# Patient Record
Sex: Female | Born: 1945
Health system: Southern US, Community
[De-identification: ages and names within clinical notes are randomized; demographics above are authoritative.]

## PROBLEM LIST (undated history)

## (undated) DIAGNOSIS — E7800 Pure hypercholesterolemia, unspecified: Secondary | ICD-10-CM

## (undated) DIAGNOSIS — N8189 Other female genital prolapse: Secondary | ICD-10-CM

## (undated) DIAGNOSIS — N6019 Diffuse cystic mastopathy of unspecified breast: Secondary | ICD-10-CM

## (undated) DIAGNOSIS — R339 Retention of urine, unspecified: Secondary | ICD-10-CM

## (undated) DIAGNOSIS — N8110 Cystocele, unspecified: Secondary | ICD-10-CM

## (undated) DIAGNOSIS — I10 Essential (primary) hypertension: Secondary | ICD-10-CM

## (undated) DIAGNOSIS — N3642 Intrinsic sphincter deficiency (ISD): Secondary | ICD-10-CM

## (undated) DIAGNOSIS — R945 Abnormal results of liver function studies: Secondary | ICD-10-CM

## (undated) DIAGNOSIS — C801 Malignant (primary) neoplasm, unspecified: Secondary | ICD-10-CM

## (undated) DIAGNOSIS — N816 Rectocele: Secondary | ICD-10-CM

## (undated) DIAGNOSIS — N393 Stress incontinence (female) (male): Secondary | ICD-10-CM

## (undated) DIAGNOSIS — K219 Gastro-esophageal reflux disease without esophagitis: Secondary | ICD-10-CM

## (undated) HISTORY — DX: Abnormal results of liver function studies: R94.5

## (undated) HISTORY — PX: EYE SURGERY: SHX253

## (undated) HISTORY — DX: Pure hypercholesterolemia, unspecified: E78.00

## (undated) HISTORY — DX: Retention of urine, unspecified: R33.9

## (undated) HISTORY — PX: APPENDECTOMY: SHX54

## (undated) HISTORY — PX: ABDOMINAL HYSTERECTOMY: SHX81

## (undated) HISTORY — DX: Diffuse cystic mastopathy of unspecified breast: N60.19

## (undated) HISTORY — DX: Gastro-esophageal reflux disease without esophagitis: K21.9

## (undated) HISTORY — PX: COLPORRHAPHY: SHX921

## (undated) HISTORY — DX: Essential (primary) hypertension: I10

## (undated) HISTORY — PX: TRANSVAGINAL TAPE (TVT) REMOVAL: SHX6154

## (undated) HISTORY — PX: OTHER SURGICAL HISTORY: SHX169

## (undated) HISTORY — DX: Malignant (primary) neoplasm, unspecified: C80.1

---

## 2000-10-24 HISTORY — PX: OTHER SURGICAL HISTORY: SHX169

## 2004-06-05 ENCOUNTER — Ambulatory Visit: Payer: Self-pay | Admitting: Internal Medicine

## 2005-03-11 ENCOUNTER — Ambulatory Visit: Payer: Self-pay | Admitting: Obstetrics and Gynecology

## 2006-03-14 ENCOUNTER — Ambulatory Visit: Payer: Self-pay | Admitting: Obstetrics and Gynecology

## 2007-03-17 ENCOUNTER — Ambulatory Visit: Payer: Self-pay | Admitting: Obstetrics and Gynecology

## 2007-03-21 ENCOUNTER — Ambulatory Visit: Payer: Self-pay | Admitting: Obstetrics and Gynecology

## 2008-03-22 ENCOUNTER — Ambulatory Visit: Payer: Self-pay | Admitting: Obstetrics and Gynecology

## 2008-04-01 ENCOUNTER — Ambulatory Visit: Payer: Self-pay | Admitting: Obstetrics and Gynecology

## 2009-04-02 ENCOUNTER — Ambulatory Visit: Payer: Self-pay | Admitting: Obstetrics and Gynecology

## 2010-04-06 ENCOUNTER — Ambulatory Visit: Payer: Self-pay | Admitting: Obstetrics and Gynecology

## 2011-05-19 ENCOUNTER — Ambulatory Visit: Payer: Self-pay | Admitting: Obstetrics and Gynecology

## 2011-10-21 LAB — LIPID PANEL
Cholesterol: 170 mg/dL (ref 0–200)
HDL: 39 mg/dL (ref 35–70)

## 2011-10-21 LAB — CBC AND DIFFERENTIAL
Hemoglobin: 13.2 g/dL (ref 12.0–16.0)
Platelets: 232 10*3/uL (ref 150–399)
WBC: 6.8 10^3/mL

## 2011-10-21 LAB — BASIC METABOLIC PANEL
Glucose: 105 mg/dL
Potassium: 3.5 mmol/L (ref 3.4–5.3)

## 2011-10-21 LAB — HEPATIC FUNCTION PANEL
ALT: 40 U/L — AB (ref 7–35)
AST: 31 U/L (ref 13–35)
Alkaline Phosphatase: 62 U/L (ref 25–125)

## 2012-05-19 ENCOUNTER — Ambulatory Visit: Payer: Self-pay | Admitting: Obstetrics and Gynecology

## 2012-05-19 LAB — HM MAMMOGRAPHY

## 2012-07-25 ENCOUNTER — Encounter: Payer: Self-pay | Admitting: *Deleted

## 2012-07-27 ENCOUNTER — Encounter: Payer: Self-pay | Admitting: Internal Medicine

## 2012-07-27 ENCOUNTER — Ambulatory Visit (INDEPENDENT_AMBULATORY_CARE_PROVIDER_SITE_OTHER): Payer: Medicare Other | Admitting: Internal Medicine

## 2012-07-27 VITALS — BP 120/70 | HR 58 | Temp 97.9°F | Ht 63.0 in | Wt 182.5 lb

## 2012-07-27 DIAGNOSIS — E7800 Pure hypercholesterolemia, unspecified: Secondary | ICD-10-CM

## 2012-07-27 DIAGNOSIS — K769 Liver disease, unspecified: Secondary | ICD-10-CM

## 2012-07-27 DIAGNOSIS — R945 Abnormal results of liver function studies: Secondary | ICD-10-CM

## 2012-07-27 DIAGNOSIS — I10 Essential (primary) hypertension: Secondary | ICD-10-CM

## 2012-07-27 DIAGNOSIS — R7989 Other specified abnormal findings of blood chemistry: Secondary | ICD-10-CM

## 2012-07-27 LAB — LIPID PANEL
Cholesterol: 175 mg/dL (ref 0–200)
HDL: 38 mg/dL — ABNORMAL LOW (ref >39.00)
Triglycerides: 210 mg/dL — ABNORMAL HIGH (ref 0.0–149.0)

## 2012-07-27 LAB — CBC WITH DIFFERENTIAL/PLATELET
Basophils Relative: 0.8 % (ref 0.0–3.0)
Eosinophils Relative: 1.9 % (ref 0.0–5.0)
Lymphocytes Relative: 30 % (ref 12.0–46.0)
Lymphs Abs: 2.1 10*3/uL (ref 0.7–4.0)
MCHC: 34.2 g/dL (ref 30.0–36.0)
MCV: 90.4 fl (ref 78.0–100.0)
Monocytes Relative: 5.6 % (ref 3.0–12.0)
Neutro Abs: 4.3 10*3/uL (ref 1.4–7.7)
Neutrophils Relative %: 61.7 % (ref 43.0–77.0)
Platelets: 252 10*3/uL (ref 150.0–400.0)
WBC: 7 10*3/uL (ref 4.5–10.5)

## 2012-07-27 LAB — BASIC METABOLIC PANEL
BUN: 21 mg/dL (ref 6–23)
CO2: 29 mEq/L (ref 19–32)
Calcium: 9.7 mg/dL (ref 8.4–10.5)
Chloride: 101 mEq/L (ref 96–112)
Creatinine, Ser: 1.1 mg/dL (ref 0.4–1.2)
Glucose, Bld: 114 mg/dL — ABNORMAL HIGH (ref 70–99)

## 2012-07-27 LAB — HEPATIC FUNCTION PANEL
ALT: 56 U/L — ABNORMAL HIGH (ref 0–35)
Albumin: 4 g/dL (ref 3.5–5.2)
Bilirubin, Direct: 0.2 mg/dL (ref 0.0–0.3)
Total Protein: 7.5 g/dL (ref 6.0–8.3)

## 2012-07-27 LAB — TSH: TSH: 2.46 u[IU]/mL (ref 0.35–5.50)

## 2012-07-27 MED ORDER — BISOPROLOL-HYDROCHLOROTHIAZIDE 10-6.25 MG PO TABS: 2.0000 | ORAL_TABLET | Freq: Every day | ORAL | Status: DC

## 2012-07-27 MED ORDER — OMEPRAZOLE 20 MG PO CPDR: 20.0000 mg | DELAYED_RELEASE_CAPSULE | Freq: Two times a day (BID) | ORAL | Status: DC

## 2012-07-27 MED ORDER — HYDROCHLOROTHIAZIDE 25 MG PO TABS: 25.0000 mg | ORAL_TABLET | Freq: Every day | ORAL | Status: DC

## 2012-07-27 MED ORDER — SIMVASTATIN 20 MG PO TABS: 20.0000 mg | ORAL_TABLET | Freq: Every day | ORAL | Status: DC

## 2012-07-27 MED ORDER — LOSARTAN POTASSIUM 100 MG PO TABS: 100.0000 mg | ORAL_TABLET | Freq: Every day | ORAL | Status: DC

## 2012-08-07 ENCOUNTER — Encounter: Payer: Self-pay | Admitting: Internal Medicine

## 2012-08-07 DIAGNOSIS — E7800 Pure hypercholesterolemia, unspecified: Secondary | ICD-10-CM | POA: Insufficient documentation

## 2012-08-07 DIAGNOSIS — I10 Essential (primary) hypertension: Secondary | ICD-10-CM | POA: Insufficient documentation

## 2012-08-07 DIAGNOSIS — R945 Abnormal results of liver function studies: Secondary | ICD-10-CM | POA: Insufficient documentation

## 2012-08-07 NOTE — Assessment & Plan Note (Signed)
On simvastatin.  Low cholesterol diet and exercise.  Check lipid panel and liver function.  

## 2012-08-07 NOTE — Progress Notes (Signed)
Subjective:    Patient ID: Lindsey Harmon, female    DOB: 11-11-1945, 67 y.o.   MRN: [id number]  HPI 67 year old female with past history of hypercholesterolemia, hypertension and abnormal liver function who comes in today for a scheduled follow up.  Saw Dr Logan Bores 11/13.  Had her mammogram 1/14.  States everything checked out fine.  Eating and drinking well.  No nausea or vomiting.  No bowel change.  No chest pain or tightness.  No sob. No acid reflux.  Bowels stable.    Past Medical History  Diagnosis Date  . Hypertension   . Pure hypercholesterolemia   . GERD (gastroesophageal reflux disease)   . Urinary retention   . Fibrocystic breast disease   . Abnormal liver function     Outpatient Encounter Prescriptions as of 07/27/2012  Medication Sig Dispense Refill  . bisoprolol-hydrochlorothiazide (ZIAC) 10-6.25 MG per tablet Take 2 tablets by mouth daily.  180 tablet  1  . hydrochlorothiazide (HYDRODIURIL) 25 MG tablet Take 1 tablet (25 mg total) by mouth daily.  90 tablet  1  . losartan (COZAAR) 100 MG tablet Take 1 tablet (100 mg total) by mouth daily.  90 tablet  1  . nitrofurantoin, macrocrystal-monohydrate, (MACROBID) 100 MG capsule Take 100 mg by mouth daily.      Marland Kitchen omeprazole (PRILOSEC) 20 MG capsule Take 1 capsule (20 mg total) by mouth 2 (two) times daily.  180 capsule  1  . simvastatin (ZOCOR) 20 MG tablet Take 1 tablet (20 mg total) by mouth daily.  90 tablet  1  . [DISCONTINUED] bisoprolol-hydrochlorothiazide (ZIAC) 10-6.25 MG per tablet Take 2 tablets by mouth daily.      . [DISCONTINUED] hydrochlorothiazide (HYDRODIURIL) 25 MG tablet Take 25 mg by mouth daily.      . [DISCONTINUED] losartan (COZAAR) 100 MG tablet Take 100 mg by mouth daily.      . [DISCONTINUED] omeprazole (PRILOSEC) 20 MG capsule Take 20 mg by mouth 2 (two) times daily.      . [DISCONTINUED] simvastatin (ZOCOR) 20 MG tablet Take 20 mg by mouth daily.       No facility-administered encounter medications on file  as of 07/27/2012.    Review of Systems Patient denies any headache, lightheadedness or dizziness.  No sinus or allergy symptoms.  No chest pain, tightness or palpitations.  No increased shortness of breath, cough or congestion.  No nausea or vomiting. No acid reflux.  No abdominal pain or cramping.  No bowel change, such as diarrhea, constipation, BRBPR or melana.  No urine change.        Objective:   Physical Exam Filed Vitals:   07/27/12 0822  BP: 120/70  Pulse: 58  Temp: 97.9 F (36.6 C)   Blood pressure recheck:  126/78, pulse 20  67 year old female in no acute distress.   HEENT:  Nares- clear.  Oropharynx - without lesions. NECK:  Supple.  Nontender.  No audible bruit.  HEART:  Appears to be regular. LUNGS:  No crackles or wheezing audible.  Respirations even and unlabored.  RADIAL PULSE:  Equal bilaterally.  ABDOMEN:  Soft, nontender.  Bowel sounds present and normal.  No audible abdominal bruit.    EXTREMITIES:  No increased edema present.  DP pulses palpable and equal bilaterally.          Assessment & Plan:  GYN.  Sees Dr Logan Bores.  Up to date.  Just had her pelvic 11/13.    HEALTH MAINTENANCE.  Pelvic/pap  through gyn.  See above.  States everything checked out fine.  Obtain records.  Mammogram 1/14.  Obtain results.  Declines colonoscopy.  I discussed the need for colonoscopy.  Stressed the importance of early screening.  She will notify me when she is agreeable.

## 2012-08-07 NOTE — Assessment & Plan Note (Signed)
Recheck liver panel today. Was worked up by GI.  Felt to be due to fatty liver.

## 2012-08-07 NOTE — Assessment & Plan Note (Signed)
Blood pressure doing well.  Same medication regimen.  Check metabolic panel.  

## 2012-08-12 ENCOUNTER — Other Ambulatory Visit: Payer: Self-pay | Admitting: Internal Medicine

## 2012-08-12 ENCOUNTER — Telehealth: Payer: Self-pay | Admitting: Internal Medicine

## 2012-08-12 DIAGNOSIS — R739 Hyperglycemia, unspecified: Secondary | ICD-10-CM

## 2012-08-12 DIAGNOSIS — R945 Abnormal results of liver function studies: Secondary | ICD-10-CM

## 2012-08-12 DIAGNOSIS — E876 Hypokalemia: Secondary | ICD-10-CM

## 2012-08-12 NOTE — Progress Notes (Signed)
Order placed for follow up lab.  

## 2012-08-12 NOTE — Telephone Encounter (Signed)
Pt notified of labs and need for follow up labs.  She will come in 08/21/12 (10:00).  Pt aware of time.  Just need to put on lab schedule.

## 2012-08-14 NOTE — Telephone Encounter (Signed)
Appointment made

## 2012-08-21 ENCOUNTER — Other Ambulatory Visit (INDEPENDENT_AMBULATORY_CARE_PROVIDER_SITE_OTHER): Payer: Medicare Other

## 2012-08-21 DIAGNOSIS — R7309 Other abnormal glucose: Secondary | ICD-10-CM

## 2012-08-21 DIAGNOSIS — E876 Hypokalemia: Secondary | ICD-10-CM

## 2012-08-21 DIAGNOSIS — R945 Abnormal results of liver function studies: Secondary | ICD-10-CM

## 2012-08-21 DIAGNOSIS — R739 Hyperglycemia, unspecified: Secondary | ICD-10-CM

## 2012-08-21 DIAGNOSIS — R7989 Other specified abnormal findings of blood chemistry: Secondary | ICD-10-CM

## 2012-08-21 LAB — BASIC METABOLIC PANEL
BUN: 28 mg/dL — ABNORMAL HIGH (ref 6–23)
CO2: 30 mEq/L (ref 19–32)
Calcium: 10.1 mg/dL (ref 8.4–10.5)
Chloride: 99 mEq/L (ref 96–112)
Creatinine, Ser: 1.3 mg/dL — ABNORMAL HIGH (ref 0.4–1.2)
GFR: 42.38 mL/min — ABNORMAL LOW (ref >60.00)
Glucose, Bld: 98 mg/dL (ref 70–99)
Potassium: 3.5 mEq/L (ref 3.5–5.1)
Sodium: 140 mEq/L (ref 135–145)

## 2012-08-21 LAB — HEPATIC FUNCTION PANEL
ALT: 39 U/L — ABNORMAL HIGH (ref 0–35)
AST: 37 U/L (ref 0–37)
Albumin: 4 g/dL (ref 3.5–5.2)
Alkaline Phosphatase: 57 U/L (ref 39–117)
Bilirubin, Direct: 0.1 mg/dL (ref 0.0–0.3)
Total Bilirubin: 1.1 mg/dL (ref 0.3–1.2)
Total Protein: 7.5 g/dL (ref 6.0–8.3)

## 2012-08-21 LAB — HEMOGLOBIN A1C: Hgb A1c MFr Bld: 5.9 % (ref 4.6–6.5)

## 2012-08-22 ENCOUNTER — Telehealth: Payer: Self-pay | Admitting: Internal Medicine

## 2012-08-22 DIAGNOSIS — N289 Disorder of kidney and ureter, unspecified: Secondary | ICD-10-CM

## 2012-08-22 NOTE — Telephone Encounter (Signed)
Appointment made

## 2012-08-22 NOTE — Telephone Encounter (Signed)
Pt notified of lab results and the need for follow up labs soon.  She is coming in 09/04/12 at 10:00 for labs.  Please put on schedule.  Thanks.  She is aware of appt.

## 2012-09-04 ENCOUNTER — Other Ambulatory Visit (INDEPENDENT_AMBULATORY_CARE_PROVIDER_SITE_OTHER): Payer: Medicare Other

## 2012-09-04 ENCOUNTER — Encounter: Payer: Self-pay | Admitting: *Deleted

## 2012-09-04 DIAGNOSIS — I10 Essential (primary) hypertension: Secondary | ICD-10-CM

## 2012-09-04 DIAGNOSIS — N289 Disorder of kidney and ureter, unspecified: Secondary | ICD-10-CM

## 2012-09-04 LAB — URINALYSIS, ROUTINE W REFLEX MICROSCOPIC
Bilirubin Urine: NEGATIVE
Hgb urine dipstick: NEGATIVE
Nitrite: NEGATIVE
Total Protein, Urine: NEGATIVE
Urine Glucose: NEGATIVE
pH: 6 (ref 5.0–8.0)

## 2012-09-04 LAB — BASIC METABOLIC PANEL
BUN: 25 mg/dL — ABNORMAL HIGH (ref 6–23)
CO2: 30 mEq/L (ref 19–32)
Chloride: 99 mEq/L (ref 96–112)
Creatinine, Ser: 1.1 mg/dL (ref 0.4–1.2)
Potassium: 3.7 mEq/L (ref 3.5–5.1)

## 2012-12-20 ENCOUNTER — Other Ambulatory Visit: Payer: Self-pay

## 2013-01-17 ENCOUNTER — Other Ambulatory Visit: Payer: Self-pay | Admitting: *Deleted

## 2013-01-17 MED ORDER — SIMVASTATIN 20 MG PO TABS: 20.0000 mg | ORAL_TABLET | Freq: Every day | ORAL | Status: DC

## 2013-01-17 MED ORDER — HYDROCHLOROTHIAZIDE 25 MG PO TABS: 25.0000 mg | ORAL_TABLET | Freq: Every day | ORAL | Status: DC

## 2013-01-17 MED ORDER — BISOPROLOL-HYDROCHLOROTHIAZIDE 10-6.25 MG PO TABS: 2.0000 | ORAL_TABLET | Freq: Every day | ORAL | Status: DC

## 2013-01-30 ENCOUNTER — Encounter: Payer: Self-pay | Admitting: Internal Medicine

## 2013-01-30 ENCOUNTER — Ambulatory Visit (INDEPENDENT_AMBULATORY_CARE_PROVIDER_SITE_OTHER): Payer: Medicare Other | Admitting: Internal Medicine

## 2013-01-30 VITALS — BP 120/80 | HR 55 | Temp 98.6°F | Ht 63.25 in | Wt 178.2 lb

## 2013-01-30 DIAGNOSIS — R109 Unspecified abdominal pain: Secondary | ICD-10-CM

## 2013-01-30 DIAGNOSIS — F429 Obsessive-compulsive disorder, unspecified: Secondary | ICD-10-CM

## 2013-01-30 DIAGNOSIS — Z87440 Personal history of urinary (tract) infections: Secondary | ICD-10-CM

## 2013-01-30 DIAGNOSIS — N951 Menopausal and female climacteric states: Secondary | ICD-10-CM

## 2013-01-30 DIAGNOSIS — R945 Abnormal results of liver function studies: Secondary | ICD-10-CM

## 2013-01-30 DIAGNOSIS — R232 Flushing: Secondary | ICD-10-CM

## 2013-01-30 DIAGNOSIS — I10 Essential (primary) hypertension: Secondary | ICD-10-CM

## 2013-01-30 DIAGNOSIS — E7800 Pure hypercholesterolemia, unspecified: Secondary | ICD-10-CM

## 2013-01-30 DIAGNOSIS — R1030 Lower abdominal pain, unspecified: Secondary | ICD-10-CM

## 2013-01-30 DIAGNOSIS — K769 Liver disease, unspecified: Secondary | ICD-10-CM

## 2013-01-30 LAB — CBC WITH DIFFERENTIAL/PLATELET
Eosinophils Relative: 0.8 % (ref 0.0–5.0)
HCT: 38 % (ref 36.0–46.0)
Hemoglobin: 13 g/dL (ref 12.0–15.0)
Lymphs Abs: 2.2 10*3/uL (ref 0.7–4.0)
MCV: 91.4 fl (ref 78.0–100.0)
Monocytes Absolute: 0.5 10*3/uL (ref 0.1–1.0)
Monocytes Relative: 6.6 % (ref 3.0–12.0)
Neutro Abs: 5.5 10*3/uL (ref 1.4–7.7)
WBC: 8.3 10*3/uL (ref 4.5–10.5)

## 2013-01-30 LAB — BASIC METABOLIC PANEL
Chloride: 98 mEq/L (ref 96–112)
GFR: 41.24 mL/min — ABNORMAL LOW (ref >60.00)
Glucose, Bld: 96 mg/dL (ref 70–99)
Potassium: 3.2 mEq/L — ABNORMAL LOW (ref 3.5–5.1)
Sodium: 139 mEq/L (ref 135–145)

## 2013-01-30 LAB — URINALYSIS, ROUTINE W REFLEX MICROSCOPIC
Ketones, ur: NEGATIVE
RBC / HPF: NONE SEEN (ref 0–?)
Specific Gravity, Urine: <=1.005 (ref 1.000–1.030)
Total Protein, Urine: NEGATIVE
Urine Glucose: NEGATIVE
pH: 6.5 (ref 5.0–8.0)

## 2013-01-30 LAB — LIPID PANEL
LDL Cholesterol: 87 mg/dL (ref 0–99)
Total CHOL/HDL Ratio: 4

## 2013-01-30 LAB — HEPATIC FUNCTION PANEL
Alkaline Phosphatase: 56 U/L (ref 39–117)
Bilirubin, Direct: 0.1 mg/dL (ref 0.0–0.3)

## 2013-01-30 MED ORDER — NYSTATIN 100000 UNIT/GM EX CREA: TOPICAL_CREAM | Freq: Two times a day (BID) | CUTANEOUS | Status: DC

## 2013-01-30 MED ORDER — SERTRALINE HCL 50 MG PO TABS: 50.0000 mg | ORAL_TABLET | Freq: Every day | ORAL | Status: DC

## 2013-01-31 ENCOUNTER — Encounter: Payer: Self-pay | Admitting: Internal Medicine

## 2013-01-31 ENCOUNTER — Telehealth: Payer: Self-pay | Admitting: Internal Medicine

## 2013-01-31 DIAGNOSIS — N289 Disorder of kidney and ureter, unspecified: Secondary | ICD-10-CM

## 2013-01-31 DIAGNOSIS — E871 Hypo-osmolality and hyponatremia: Secondary | ICD-10-CM

## 2013-01-31 NOTE — Telephone Encounter (Signed)
Pt will be out of town next week she made appointment for 9/29.  Pt wanted to make sure it was ok to wait till then

## 2013-01-31 NOTE — Telephone Encounter (Signed)
Ok to wait until she returns.

## 2013-01-31 NOTE — Telephone Encounter (Signed)
Pt notified of lab results via my chart.  Needs a f/u lab in 7-10 days.  Please schedule her for a non fasting lab appt next week and call her with a lab appt date and time.   Thanks.

## 2013-02-01 ENCOUNTER — Encounter: Payer: Self-pay | Admitting: Internal Medicine

## 2013-02-01 LAB — CULTURE, URINE COMPREHENSIVE
Colony Count: NO GROWTH
Organism ID, Bacteria: NO GROWTH

## 2013-02-02 ENCOUNTER — Other Ambulatory Visit: Payer: Self-pay | Admitting: Internal Medicine

## 2013-02-02 ENCOUNTER — Encounter: Payer: Self-pay | Admitting: Internal Medicine

## 2013-02-02 DIAGNOSIS — F429 Obsessive-compulsive disorder, unspecified: Secondary | ICD-10-CM | POA: Insufficient documentation

## 2013-02-02 DIAGNOSIS — Z87440 Personal history of urinary (tract) infections: Secondary | ICD-10-CM | POA: Insufficient documentation

## 2013-02-02 MED ORDER — SIMVASTATIN 20 MG PO TABS: 20.0000 mg | ORAL_TABLET | Freq: Every day | ORAL | Status: DC

## 2013-02-02 MED ORDER — BISOPROLOL-HYDROCHLOROTHIAZIDE 10-6.25 MG PO TABS: 2.0000 | ORAL_TABLET | Freq: Every day | ORAL | Status: DC

## 2013-02-02 MED ORDER — OMEPRAZOLE 20 MG PO CPDR: 20.0000 mg | DELAYED_RELEASE_CAPSULE | Freq: Two times a day (BID) | ORAL | Status: DC

## 2013-02-02 MED ORDER — LOSARTAN POTASSIUM 100 MG PO TABS: 100.0000 mg | ORAL_TABLET | Freq: Every day | ORAL | Status: DC

## 2013-02-02 MED ORDER — HYDROCHLOROTHIAZIDE 25 MG PO TABS: 25.0000 mg | ORAL_TABLET | Freq: Every day | ORAL | Status: DC

## 2013-02-02 NOTE — Assessment & Plan Note (Signed)
Dr Logan Bores gave her macrobid as a suppressive abx.  Just treated herself for possible uti.  Recheck urinalysis today.

## 2013-02-02 NOTE — Assessment & Plan Note (Signed)
Discussed at length with her today.  Some increased anxiety.  Start zoloft 50mg  (1/2 tablet) q day for 7 days and then increase to 50mg  q day.  Discussed counseling/psychiatry referral.  She declines.  Will follow. Closely.  Notify me if symptoms change or worsen.

## 2013-02-02 NOTE — Progress Notes (Signed)
Refilled omeprazole #180 with one refill

## 2013-02-02 NOTE — Assessment & Plan Note (Signed)
On simvastatin.  Low cholesterol diet and exercise.  Follow lipid panel and liver function.      

## 2013-02-02 NOTE — Assessment & Plan Note (Signed)
Recheck liver panel.  Was worked up by GI.  Felt to be due to fatty liver.

## 2013-02-02 NOTE — Assessment & Plan Note (Signed)
Blood pressure doing well.  Same medication regimen.  Follow metabolic panel.   

## 2013-02-02 NOTE — Progress Notes (Signed)
Subjective:    Patient ID: Lindsey Harmon, female    DOB: [birth year], 67 y.o.   MRN: [id number]  HPI 67 year old female with past history of hypercholesterolemia, hypertension and abnormal liver function who comes in today for a scheduled follow up.  Saw Dr Logan Bores 11/13.  Had her mammogram 1/14.  States everything checked out fine.  Eating and drinking well.  No nausea or vomiting.  No bowel change.  No chest pain or tightness.  No sob. No acid reflux.  Bowels stable.  Did have some lower abdominal pressure.  Has macrobid which she takes daily as a suppressive abx.  States Dr Logan Bores had instructed her if she has symptoms c/w uti, then take bid.  She took the macrobid twice a day for five days.  Symptoms improved/resolved.  Wants urine checked to confirm cleared.  She does report that she has some obsessive compulsive tendencies.  Getting worse.  Has to check things multiple times, etc.  States no one in her family knows.  She tries to hide this.  Feels it is gradually getting worse.  Some anxiety.  Discussed counseling and medications.  She declines counseling.     Past Medical History  Diagnosis Date  . Hypertension   . Pure hypercholesterolemia   . GERD (gastroesophageal reflux disease)   . Urinary retention   . Fibrocystic breast disease   . Abnormal liver function     Outpatient Encounter Prescriptions as of 01/30/2013  Medication Sig Dispense Refill  . bisoprolol-hydrochlorothiazide (ZIAC) 10-6.25 MG per tablet Take 2 tablets by mouth daily.  180 tablet  0  . Calcium Carbonate-Vitamin D (CALTRATE 600+D PO) Take by mouth.      . hydrochlorothiazide (HYDRODIURIL) 25 MG tablet Take 1 tablet (25 mg total) by mouth daily.  90 tablet  0  . losartan (COZAAR) 100 MG tablet Take 1 tablet (100 mg total) by mouth daily.  90 tablet  1  . nitrofurantoin, macrocrystal-monohydrate, (MACROBID) 100 MG capsule Take 100 mg by mouth daily.      . Omega-3 Fatty Acids (FISH OIL) 1200 MG CAPS Take by mouth daily.       Marland Kitchen omeprazole (PRILOSEC) 20 MG capsule Take 1 capsule (20 mg total) by mouth 2 (two) times daily.  180 capsule  1  . simvastatin (ZOCOR) 20 MG tablet Take 1 tablet (20 mg total) by mouth daily.  90 tablet  0  . nystatin cream (MYCOSTATIN) Apply topically 2 (two) times daily.  30 g  0  . sertraline (ZOLOFT) 50 MG tablet Take 1 tablet (50 mg total) by mouth daily. Take in the morning  30 tablet  1   No facility-administered encounter medications on file as of 01/30/2013.    Review of Systems Patient denies any headache, lightheadedness or dizziness.  No sinus or allergy symptoms.  No chest pain, tightness or palpitations.  No increased shortness of breath, cough or congestion.  No nausea or vomiting. No acid reflux.  No abdominal pain or cramping.  No bowel change, such as diarrhea, constipation, BRBPR or melana.  No urine change now.  Previous symptoms as outlined.        Objective:   Physical Exam  Filed Vitals:   01/30/13 1044  BP: 120/80  Pulse: 55  Temp: 98.6 F (37 C)   Blood pressure recheck:  130/72, pulse 21  67 year old female in no acute distress.   HEENT:  Nares- clear.  Oropharynx - without lesions. NECK:  Supple.  Nontender.  No audible bruit.  HEART:  Appears to be regular. LUNGS:  No crackles or wheezing audible.  Respirations even and unlabored.  RADIAL PULSE:  Equal bilaterally.  ABDOMEN:  Soft, nontender.  Bowel sounds present and normal.  No audible abdominal bruit.    EXTREMITIES:  No increased edema present.  DP pulses palpable and equal bilaterally.          Assessment & Plan:  GYN.  Sees Dr Logan Bores.  Up to date.  Just had her pelvic 11/13.    HEALTH MAINTENANCE.  Pelvic/pap through gyn.  See above.  States everything checked out fine.  Obtain records.  Mammogram 1/14.  Declines colonoscopy.  I discussed the need for colonoscopy.  Stressed the importance of early screening.  She will notify me when she is agreeable.

## 2013-02-12 ENCOUNTER — Other Ambulatory Visit (INDEPENDENT_AMBULATORY_CARE_PROVIDER_SITE_OTHER): Payer: Medicare Other

## 2013-02-12 ENCOUNTER — Encounter: Payer: Self-pay | Admitting: Internal Medicine

## 2013-02-12 DIAGNOSIS — E871 Hypo-osmolality and hyponatremia: Secondary | ICD-10-CM

## 2013-02-12 DIAGNOSIS — N289 Disorder of kidney and ureter, unspecified: Secondary | ICD-10-CM

## 2013-02-12 LAB — BASIC METABOLIC PANEL
BUN: 14 mg/dL (ref 6–23)
Creatinine, Ser: 1 mg/dL (ref 0.4–1.2)
GFR: 58.98 mL/min — ABNORMAL LOW (ref >60.00)
Potassium: 3.7 mEq/L (ref 3.5–5.1)

## 2013-02-14 ENCOUNTER — Encounter: Payer: Self-pay | Admitting: Internal Medicine

## 2013-03-14 ENCOUNTER — Encounter: Payer: Self-pay | Admitting: Internal Medicine

## 2013-03-14 ENCOUNTER — Ambulatory Visit (INDEPENDENT_AMBULATORY_CARE_PROVIDER_SITE_OTHER): Payer: Medicare Other | Admitting: Internal Medicine

## 2013-03-14 VITALS — BP 120/78 | HR 54 | Temp 98.5°F | Ht 63.25 in | Wt 178.2 lb

## 2013-03-14 DIAGNOSIS — F429 Obsessive-compulsive disorder, unspecified: Secondary | ICD-10-CM

## 2013-03-14 DIAGNOSIS — I10 Essential (primary) hypertension: Secondary | ICD-10-CM

## 2013-03-14 DIAGNOSIS — K769 Liver disease, unspecified: Secondary | ICD-10-CM

## 2013-03-14 DIAGNOSIS — R945 Abnormal results of liver function studies: Secondary | ICD-10-CM

## 2013-03-14 MED ORDER — SERTRALINE HCL 50 MG PO TABS: 50.0000 mg | ORAL_TABLET | Freq: Every day | ORAL | Status: DC

## 2013-03-15 ENCOUNTER — Encounter: Payer: Self-pay | Admitting: Internal Medicine

## 2013-03-15 NOTE — Assessment & Plan Note (Signed)
Blood pressure doing well.  Same medication regimen.  Follow metabolic panel.   

## 2013-03-15 NOTE — Progress Notes (Signed)
Subjective:    Patient ID: Lindsey Harmon, female    DOB: 14-Nov-1945, 68 y.o.   MRN: [id number]  HPI 67 year old female with past history of hypercholesterolemia, hypertension and abnormal liver function who comes in today for a scheduled follow up.  Saw Dr Logan Bores two weeks ago.   Had her mammogram 1/14.  Is scheduled to have a f/u mammogram in 1/15.  Had a vaginal cyst removed.  Everything checked out fine.   Eating and drinking well.  No nausea or vomiting.  No bowel change.  No chest pain or tightness.  No sob. No acid reflux.  Bowels stable.  She had reported at her last visit that she has some obsessive compulsive tendencies.  See last note for details.  Was started on zoloft for her increased anxiety.  She declined counseling.  She comes in today stating that she feels better.  Anxiety better.  We discussed the dose of the medication and she desires to remain on her current dose.  Discussed again counseling/psychiatry referral.  She declines.       Past Medical History  Diagnosis Date  . Hypertension   . Pure hypercholesterolemia   . GERD (gastroesophageal reflux disease)   . Urinary retention   . Fibrocystic breast disease   . Abnormal liver function     Outpatient Encounter Prescriptions as of 03/14/2013  Medication Sig Dispense Refill  . bisoprolol-hydrochlorothiazide (ZIAC) 10-6.25 MG per tablet Take 2 tablets by mouth daily.  180 tablet  1  . Calcium Carbonate-Vitamin D (CALTRATE 600+D PO) Take by mouth.      . hydrochlorothiazide (HYDRODIURIL) 25 MG tablet Take 1 tablet (25 mg total) by mouth daily.  90 tablet  1  . losartan (COZAAR) 100 MG tablet Take 1 tablet (100 mg total) by mouth daily.  90 tablet  1  . nitrofurantoin, macrocrystal-monohydrate, (MACROBID) 100 MG capsule Take 100 mg by mouth daily.      Marland Kitchen nystatin cream (MYCOSTATIN) Apply topically 2 (two) times daily.  30 g  0  . Omega-3 Fatty Acids (FISH OIL) 1200 MG CAPS Take by mouth daily.      Marland Kitchen omeprazole (PRILOSEC) 20  MG capsule Take 1 capsule (20 mg total) by mouth 2 (two) times daily.  180 capsule  1  . sertraline (ZOLOFT) 50 MG tablet Take 1 tablet (50 mg total) by mouth daily. Take in the morning  90 tablet  1  . simvastatin (ZOCOR) 20 MG tablet Take 1 tablet (20 mg total) by mouth daily.  90 tablet  1  . [DISCONTINUED] sertraline (ZOLOFT) 50 MG tablet Take 1 tablet (50 mg total) by mouth daily. Take in the morning  30 tablet  1   No facility-administered encounter medications on file as of 03/14/2013.    Review of Systems Patient denies any headache, lightheadedness or dizziness.  No sinus or allergy symptoms.  No chest pain, tightness or palpitations.  No increased shortness of breath, cough or congestion.  No nausea or vomiting. No acid reflux.  No abdominal pain or cramping.  No bowel change, such as diarrhea, constipation, BRBPR or melana.  No urine change.  Doing better on the zoloft.  Feels better.         Objective:   Physical Exam  Filed Vitals:   03/14/13 1124  BP: 120/78  Pulse: 54  Temp: 98.5 F (56.39 C)   67 year old female in no acute distress.   HEENT:  Nares- clear.  Oropharynx -  without lesions. NECK:  Supple.  Nontender.  No audible bruit.  HEART:  Appears to be regular. LUNGS:  No crackles or wheezing audible.  Respirations even and unlabored.  RADIAL PULSE:  Equal bilaterally.  ABDOMEN:  Soft, nontender.  Bowel sounds present and normal.  No audible abdominal bruit.    EXTREMITIES:  No increased edema present.  DP pulses palpable and equal bilaterally.          Assessment & Plan:  GYN.  Sees Dr Logan Bores.  Up to date.  Just had her pelvic/pap two weeks ago.     HEALTH MAINTENANCE.  Pelvic/pap through gyn.  See above.  States everything checked out fine.  Obtain records.  Mammogram 1/14.  Is scheduled for f/u mammogram in 1/15.   Declines colonoscopy.  I have discussed the need for colonoscopy.  Stressed the importance of early screening.  She will notify me when she is  agreeable.

## 2013-03-15 NOTE — Assessment & Plan Note (Signed)
Was worked up by GI.  Felt to be due to fatty liver.  Recent liver panel wnl.   

## 2013-03-15 NOTE — Assessment & Plan Note (Signed)
On zoloft 50mg q day.  Feels better.  Discussed counseling/psychiatry referral.  She declines.  Will follow. Closely.  Notify me if symptoms change or worsen.  She feels this dose is adequate.  Desires not to change at this time.     

## 2013-04-18 ENCOUNTER — Other Ambulatory Visit: Payer: Self-pay | Admitting: Internal Medicine

## 2013-05-22 ENCOUNTER — Ambulatory Visit: Payer: Self-pay | Admitting: Obstetrics and Gynecology

## 2013-05-30 ENCOUNTER — Ambulatory Visit (INDEPENDENT_AMBULATORY_CARE_PROVIDER_SITE_OTHER): Payer: Medicare Other | Admitting: Internal Medicine

## 2013-05-30 ENCOUNTER — Encounter: Payer: Self-pay | Admitting: Internal Medicine

## 2013-05-30 VITALS — BP 130/80 | HR 48 | Temp 98.2°F | Ht 63.25 in | Wt 181.8 lb

## 2013-05-30 DIAGNOSIS — E7800 Pure hypercholesterolemia, unspecified: Secondary | ICD-10-CM

## 2013-05-30 DIAGNOSIS — R945 Abnormal results of liver function studies: Secondary | ICD-10-CM

## 2013-05-30 DIAGNOSIS — K769 Liver disease, unspecified: Secondary | ICD-10-CM

## 2013-05-30 DIAGNOSIS — R001 Bradycardia, unspecified: Secondary | ICD-10-CM

## 2013-05-30 DIAGNOSIS — F429 Obsessive-compulsive disorder, unspecified: Secondary | ICD-10-CM

## 2013-05-30 DIAGNOSIS — I498 Other specified cardiac arrhythmias: Secondary | ICD-10-CM

## 2013-05-30 DIAGNOSIS — I10 Essential (primary) hypertension: Secondary | ICD-10-CM

## 2013-05-30 NOTE — Progress Notes (Signed)
Pre-visit discussion using our clinic review tool. No additional management support is needed unless otherwise documented below in the visit note.  

## 2013-05-30 NOTE — Patient Instructions (Signed)
Decrease your ziac to one pill per day.

## 2013-05-30 NOTE — Progress Notes (Signed)
Subjective:    Patient ID: Lindsey Harmon, female    DOB: [birth year], 68 y.o.   MRN: [id number]  HPI 68 year old female with past history of hypercholesterolemia, hypertension and abnormal liver function who comes in today for a scheduled follow up.   Eating and drinking well.  No nausea or vomiting.  No bowel change.  No chest pain or tightness.  No sob. No acid reflux.  Bowels stable.  She had reported at a previous visit that she has some obsessive compulsive tendencies.  See previous note for details.  Was started on zoloft for her increased anxiety.  She declined counseling.  She comes in today stating that she feels better.  Anxiety better.  We discussed the dose of the medication and she desires to remain on her current dose.  Discussed again counseling/psychiatry referral.  She declines.  No dizziness or light headedness.      Past Medical History  Diagnosis Date  . Hypertension   . Pure hypercholesterolemia   . GERD (gastroesophageal reflux disease)   . Urinary retention   . Fibrocystic breast disease   . Abnormal liver function     Outpatient Encounter Prescriptions as of 05/30/2013  Medication Sig  . bisoprolol-hydrochlorothiazide (ZIAC) 10-6.25 MG per tablet Take 2 tablets by mouth daily.  . Calcium Carbonate-Vitamin D (CALTRATE 600+D PO) Take by mouth.  . hydrochlorothiazide (HYDRODIURIL) 25 MG tablet TAKE 1 TABLET (25 MG TOTAL) BY MOUTH DAILY.  Marland Kitchen losartan (COZAAR) 100 MG tablet Take 1 tablet (100 mg total) by mouth daily.  . nitrofurantoin, macrocrystal-monohydrate, (MACROBID) 100 MG capsule Take 100 mg by mouth daily.  . Omega-3 Fatty Acids (FISH OIL) 1200 MG CAPS Take by mouth daily.  Marland Kitchen omeprazole (PRILOSEC) 20 MG capsule Take 1 capsule (20 mg total) by mouth 2 (two) times daily.  . sertraline (ZOLOFT) 50 MG tablet Take 1 tablet (50 mg total) by mouth daily. Take in the morning  . simvastatin (ZOCOR) 20 MG tablet TAKE 1 TABLET (20 MG TOTAL) BY MOUTH DAILY.  . [DISCONTINUED]  hydrochlorothiazide (HYDRODIURIL) 25 MG tablet Take 1 tablet (25 mg total) by mouth daily.  . [DISCONTINUED] nystatin cream (MYCOSTATIN) Apply topically 2 (two) times daily.    Review of Systems Patient denies any headache, lightheadedness or dizziness.  No sinus or allergy symptoms.  No chest pain, tightness or palpitations.  No increased shortness of breath, cough or congestion.  No nausea or vomiting. No acid reflux.  No abdominal pain or cramping.  No bowel change, such as diarrhea, constipation, BRBPR or melana.  No urine change.  Doing better on the zoloft.  Feels better.         Objective:   Physical Exam  Filed Vitals:   05/30/13 1115  BP: 130/80  Pulse: 48  Temp: 98.2 F (63.54 C)   68 year old female in no acute distress.   HEENT:  Nares- clear.  Oropharynx - without lesions. NECK:  Supple.  Nontender.  No audible bruit.  HEART:  Appears to be regular. LUNGS:  No crackles or wheezing audible.  Respirations even and unlabored.  RADIAL PULSE:  Equal bilaterally.  ABDOMEN:  Soft, nontender.  Bowel sounds present and normal.  No audible abdominal bruit.    EXTREMITIES:  No increased edema present.  DP pulses palpable and equal bilaterally.          Assessment & Plan:  GYN.  Sees Dr Amalia Hailey.  Up to date.      HEALTH MAINTENANCE.  Pelvic/pap through gyn.  Declines colonoscopy.  I have discussed the need for colonoscopy.  Stressed the importance of early screening.  She will notify me when she is agreeable.  Mammogram 05/22/13 - ok.

## 2013-05-30 NOTE — Assessment & Plan Note (Addendum)
On zoloft 50mg  q day.  Feels better.  Discussed counseling/psychiatry referral.  She declines.  Will follow. Closely.  Notify me if symptoms change or worsen.  She feels this dose is adequate.  Desires not to change at this time.

## 2013-05-30 NOTE — Assessment & Plan Note (Addendum)
Blood pressure doing well.  Given low pulse rate, will decrease ziac to one tablet per day.  Follow metabolic panel.

## 2013-06-03 ENCOUNTER — Encounter: Payer: Self-pay | Admitting: Internal Medicine

## 2013-06-03 DIAGNOSIS — R001 Bradycardia, unspecified: Secondary | ICD-10-CM | POA: Insufficient documentation

## 2013-06-03 NOTE — Assessment & Plan Note (Signed)
Pulse rate as outlined.  Decrease ziac to one tablet per day.  Follow.

## 2013-06-03 NOTE — Assessment & Plan Note (Signed)
On simvastatin.  Low cholesterol diet and exercise.  Follow lipid panel and liver function.      

## 2013-06-03 NOTE — Assessment & Plan Note (Signed)
Was worked up by GI.  Felt to be due to fatty liver.  Recent liver panel wnl.

## 2013-07-11 ENCOUNTER — Ambulatory Visit (INDEPENDENT_AMBULATORY_CARE_PROVIDER_SITE_OTHER): Payer: Medicare Other | Admitting: Internal Medicine

## 2013-07-11 ENCOUNTER — Encounter: Payer: Self-pay | Admitting: Internal Medicine

## 2013-07-11 VITALS — BP 130/78 | HR 62 | Temp 98.3°F | Ht 63.25 in | Wt 181.5 lb

## 2013-07-11 DIAGNOSIS — R945 Abnormal results of liver function studies: Secondary | ICD-10-CM

## 2013-07-11 DIAGNOSIS — R001 Bradycardia, unspecified: Secondary | ICD-10-CM

## 2013-07-11 DIAGNOSIS — K769 Liver disease, unspecified: Secondary | ICD-10-CM

## 2013-07-11 DIAGNOSIS — I498 Other specified cardiac arrhythmias: Secondary | ICD-10-CM

## 2013-07-11 DIAGNOSIS — I10 Essential (primary) hypertension: Secondary | ICD-10-CM

## 2013-07-11 DIAGNOSIS — F429 Obsessive-compulsive disorder, unspecified: Secondary | ICD-10-CM

## 2013-07-11 DIAGNOSIS — E7800 Pure hypercholesterolemia, unspecified: Secondary | ICD-10-CM

## 2013-07-11 MED ORDER — BISOPROLOL FUMARATE 5 MG PO TABS: 5.0000 mg | ORAL_TABLET | Freq: Every day | ORAL | Status: DC

## 2013-07-11 NOTE — Patient Instructions (Signed)
Stop the ziac.  Start zebeta 5mg  - one per day.

## 2013-07-12 ENCOUNTER — Encounter: Payer: Self-pay | Admitting: Internal Medicine

## 2013-07-12 NOTE — Assessment & Plan Note (Signed)
Pulse rate better on ziac one per day.  Blood pressure doing better.  Will change to zebeta 5mg  q day.  Follow.

## 2013-07-12 NOTE — Assessment & Plan Note (Signed)
Blood pressure doing well.  Given low pulse rate, ziac was decreased to one tablet per day.  Pulse better.  Blood pressure doing well.  Will change to zebeta 5mg  q day.  Follow.

## 2013-07-12 NOTE — Assessment & Plan Note (Signed)
Was worked up by GI.  Felt to be due to fatty liver.  Recent liver panel wnl.   

## 2013-07-12 NOTE — Progress Notes (Signed)
Subjective:    Patient ID: Lindsey Harmon, female    DOB: Oct 24, 1945, 68 y.o.   MRN: [id number]  HPI 68 year old female with past history of hypercholesterolemia, hypertension and abnormal liver function who comes in today for a scheduled follow up.   Eating and drinking well.  No nausea or vomiting.  No bowel change.  No chest pain or tightness.  No sob. No acid reflux.  Bowels stable.  She had reported at a previous visit that she has some obsessive compulsive tendencies.  See previous note for details.  Was started on zoloft for her increased anxiety.  She declined counseling.  She comes in today stating that she feels better.  Anxiety better.  We discussed the dose of the medication and she desires to remain on her current dose.  Discussed again counseling/psychiatry referral.  She declines.  No dizziness or light headedness.   Last visit, we discussed her ziac to one per day.  Doing well.  Pulse better.  Feels better.     Past Medical History  Diagnosis Date  . Hypertension   . Pure hypercholesterolemia   . GERD (gastroesophageal reflux disease)   . Urinary retention   . Fibrocystic breast disease   . Abnormal liver function     Outpatient Encounter Prescriptions as of 07/11/2013  Medication Sig  . Calcium Carbonate-Vitamin D (CALTRATE 600+D PO) Take by mouth.  . hydrochlorothiazide (HYDRODIURIL) 25 MG tablet TAKE 1 TABLET (25 MG TOTAL) BY MOUTH DAILY.  Marland Kitchen losartan (COZAAR) 100 MG tablet Take 1 tablet (100 mg total) by mouth daily.  . nitrofurantoin, macrocrystal-monohydrate, (MACROBID) 100 MG capsule Take 100 mg by mouth daily.  . Omega-3 Fatty Acids (FISH OIL) 1200 MG CAPS Take by mouth daily.  Marland Kitchen omeprazole (PRILOSEC) 20 MG capsule Take 1 capsule (20 mg total) by mouth 2 (two) times daily.  . sertraline (ZOLOFT) 50 MG tablet Take 1 tablet (50 mg total) by mouth daily. Take in the morning  . simvastatin (ZOCOR) 20 MG tablet TAKE 1 TABLET (20 MG TOTAL) BY MOUTH DAILY.  . [DISCONTINUED]  bisoprolol-hydrochlorothiazide (ZIAC) 10-6.25 MG per tablet Take 1 tablet by mouth daily.  . bisoprolol (ZEBETA) 5 MG tablet Take 1 tablet (5 mg total) by mouth daily.    Review of Systems Patient denies any headache, lightheadedness or dizziness.  No sinus or allergy symptoms.  No chest pain, tightness or palpitations.  No increased shortness of breath, cough or congestion.  No nausea or vomiting. No acid reflux.  No abdominal pain or cramping.  No bowel change, such as diarrhea, constipation, BRBPR or melana.  No urine change.  Doing better on the zoloft.  Feels better.         Objective:   Physical Exam  Filed Vitals:   07/11/13 1143  BP: 130/78  Pulse: 62  Temp: 98.3 F (70.59 C)   68 year old female in no acute distress.   HEENT:  Nares- clear.  Oropharynx - without lesions. NECK:  Supple.  Nontender.  No audible bruit.  HEART:  Appears to be regular. LUNGS:  No crackles or wheezing audible.  Respirations even and unlabored.  RADIAL PULSE:  Equal bilaterally.  ABDOMEN:  Soft, nontender.  Bowel sounds present and normal.  No audible abdominal bruit.    EXTREMITIES:  No increased edema present.  DP pulses palpable and equal bilaterally.          Assessment & Plan:  GYN.  Sees Dr Amalia Hailey.  Up to  date.      HEALTH MAINTENANCE.  Pelvic/pap through gyn.  Declines colonoscopy.  I have discussed the need for colonoscopy.  Stressed the importance of early screening.  She will notify me when she is agreeable.  Mammogram 05/22/13 - ok.

## 2013-07-12 NOTE — Assessment & Plan Note (Signed)
On zoloft 50mg  q day.  Feels better.  Discussed counseling/psychiatry referral.  She declines.  Will follow. Closely.  Notify me if symptoms change or worsen.  She feels this dose is adequate.  Desires not to change at this time.   Feels better.

## 2013-07-12 NOTE — Assessment & Plan Note (Signed)
On simvastatin.  Low cholesterol diet and exercise.  Follow lipid panel and liver function.      

## 2013-08-31 ENCOUNTER — Other Ambulatory Visit: Payer: Self-pay | Admitting: Internal Medicine

## 2013-09-14 ENCOUNTER — Other Ambulatory Visit: Payer: Self-pay | Admitting: Internal Medicine

## 2013-09-18 ENCOUNTER — Telehealth: Payer: Self-pay | Admitting: *Deleted

## 2013-09-18 ENCOUNTER — Ambulatory Visit: Payer: Self-pay | Admitting: Internal Medicine

## 2013-09-18 ENCOUNTER — Ambulatory Visit (INDEPENDENT_AMBULATORY_CARE_PROVIDER_SITE_OTHER): Payer: Medicare Other | Admitting: Internal Medicine

## 2013-09-18 ENCOUNTER — Encounter: Payer: Self-pay | Admitting: Internal Medicine

## 2013-09-18 VITALS — BP 140/70 | HR 60 | Temp 98.2°F | Ht 63.25 in | Wt 181.5 lb

## 2013-09-18 DIAGNOSIS — M79609 Pain in unspecified limb: Secondary | ICD-10-CM

## 2013-09-18 DIAGNOSIS — R945 Abnormal results of liver function studies: Secondary | ICD-10-CM

## 2013-09-18 DIAGNOSIS — F429 Obsessive-compulsive disorder, unspecified: Secondary | ICD-10-CM

## 2013-09-18 DIAGNOSIS — I498 Other specified cardiac arrhythmias: Secondary | ICD-10-CM

## 2013-09-18 DIAGNOSIS — K769 Liver disease, unspecified: Secondary | ICD-10-CM

## 2013-09-18 DIAGNOSIS — I10 Essential (primary) hypertension: Secondary | ICD-10-CM

## 2013-09-18 DIAGNOSIS — S92911A Unspecified fracture of right toe(s), initial encounter for closed fracture: Secondary | ICD-10-CM

## 2013-09-18 DIAGNOSIS — M79676 Pain in unspecified toe(s): Secondary | ICD-10-CM

## 2013-09-18 DIAGNOSIS — E7800 Pure hypercholesterolemia, unspecified: Secondary | ICD-10-CM

## 2013-09-18 DIAGNOSIS — R001 Bradycardia, unspecified: Secondary | ICD-10-CM

## 2013-09-18 NOTE — Assessment & Plan Note (Addendum)
Pulse rate better.  Blood pressure controlled.   Follow.    

## 2013-09-18 NOTE — Telephone Encounter (Signed)
Order placed for podiatry referral.   

## 2013-09-18 NOTE — Progress Notes (Signed)
Pre visit review using our clinic review tool, if applicable. No additional management support is needed unless otherwise documented below in the visit note. 

## 2013-09-18 NOTE — Progress Notes (Signed)
Subjective:    Patient ID: Lindsey Harmon, female    DOB: 09/06/1945, 68 y.o.   MRN: [id number]  HPI 68 year old female with past history of hypercholesterolemia, hypertension and abnormal liver function who comes in today for a scheduled follow up.   Eating and drinking well.  No nausea or vomiting.  No bowel change.  No chest pain or tightness.  No sob. No acid reflux.  Bowels stable.  She had reported at a previous visit that she has some obsessive compulsive tendencies.  See previous note for details.  Was started on zoloft for her increased anxiety.  She declined counseling.  She comes in today stating that she feels better.  Anxiety better.  We discussed the dose of the medication and she desires to remain on her current dose.  Discussed again counseling/psychiatry referral.  She declines.  No dizziness or light headedness.   Last visit, we decreased her zebeta to 5mg  q day.  Doing well.  Pulse better.  Feels better.  Blood pressure averaging 122-139/50-60s. She does report she fell one week ago.  Caught her foot.  Injured her right great toe.  Still with increased pain and swelling.  Has iced it and soaked.  Increased pain with pressure or ambulation.   Past Medical History  Diagnosis Date  . Hypertension   . Pure hypercholesterolemia   . GERD (gastroesophageal reflux disease)   . Urinary retention   . Fibrocystic breast disease   . Abnormal liver function     Outpatient Encounter Prescriptions as of 09/18/2013  Medication Sig  . bisoprolol (ZEBETA) 5 MG tablet TAKE 1 TABLET BY MOUTH DAILY.  . Calcium Carbonate-Vitamin D (CALTRATE 600+D PO) Take by mouth.  . hydrochlorothiazide (HYDRODIURIL) 25 MG tablet TAKE 1 TABLET (25 MG TOTAL) BY MOUTH DAILY.  Marland Kitchen losartan (COZAAR) 100 MG tablet Take 1 tablet (100 mg total) by mouth daily.  . nitrofurantoin, macrocrystal-monohydrate, (MACROBID) 100 MG capsule Take 100 mg by mouth daily.  . Omega-3 Fatty Acids (FISH OIL) 1200 MG CAPS Take by mouth daily.   Marland Kitchen omeprazole (PRILOSEC) 20 MG capsule Take 1 capsule (20 mg total) by mouth 2 (two) times daily.  . sertraline (ZOLOFT) 50 MG tablet TAKE 1 TABLET BY MOUTH IN THE MORNING  . simvastatin (ZOCOR) 20 MG tablet TAKE 1 TABLET (20 MG TOTAL) BY MOUTH DAILY.    Review of Systems Patient denies any headache, lightheadedness or dizziness.  No sinus or allergy symptoms.  No chest pain, tightness or palpitations.  No increased shortness of breath, cough or congestion.  No nausea or vomiting. No acid reflux.  No abdominal pain or cramping.  No bowel change, such as diarrhea, constipation, BRBPR or melana.  No urine change.  Doing better on the zoloft.  Feels better.   Toe pain and swelling s/p injury.      Objective:   Physical Exam  Filed Vitals:   09/18/13 1028  BP: 140/70  Pulse: 60  Temp: 98.2 F (36.8 C)   Blood pressure recheck:  136/84, pulse 2-66  68 year old female in no acute distress.   HEENT:  Nares- clear.  Oropharynx - without lesions. NECK:  Supple.  Nontender.  No audible bruit.  HEART:  Appears to be regular. LUNGS:  No crackles or wheezing audible.  Respirations even and unlabored.  RADIAL PULSE:  Equal bilaterally.  ABDOMEN:  Soft, nontender.  Bowel sounds present and normal.  No audible abdominal bruit.    EXTREMITIES:  No  increased edema present.  DP pulses palpable and equal bilaterally.   MSK:  Increased soft tissue swelling right great toe.  Increased tenderness to palpation right great toe and base of toe.         Assessment & Plan:  GYN.  Has been seeing Dr Amalia Hailey.  Up to date.      HEALTH MAINTENANCE.  Pelvic/pap through gyn.  Declines colonoscopy.  I have discussed the need for colonoscopy.  Stressed the importance of early screening.  She will notify me when she is agreeable.  Mammogram 05/22/13 - ok.

## 2013-09-18 NOTE — Telephone Encounter (Signed)
Pt notified that x-ray revealed fx of right great toe. Pt has already picked up shoe & willing to proceed with Podiatry referral. (Copy of xray given to Dominica)

## 2013-09-19 ENCOUNTER — Encounter: Payer: Self-pay | Admitting: Internal Medicine

## 2013-09-19 DIAGNOSIS — M79676 Pain in unspecified toe(s): Secondary | ICD-10-CM | POA: Insufficient documentation

## 2013-09-19 MED ORDER — OMEPRAZOLE 20 MG PO CPDR: 20.0000 mg | DELAYED_RELEASE_CAPSULE | Freq: Two times a day (BID) | ORAL | Status: DC

## 2013-09-19 MED ORDER — LOSARTAN POTASSIUM 100 MG PO TABS: 100.0000 mg | ORAL_TABLET | Freq: Every day | ORAL | Status: DC

## 2013-09-19 MED ORDER — HYDROCHLOROTHIAZIDE 25 MG PO TABS: 25.0000 mg | ORAL_TABLET | Freq: Every day | ORAL | Status: DC

## 2013-09-19 MED ORDER — SERTRALINE HCL 50 MG PO TABS: 50.0000 mg | ORAL_TABLET | Freq: Every day | ORAL | Status: DC

## 2013-09-19 NOTE — Assessment & Plan Note (Signed)
Was worked up by GI.  Felt to be due to fatty liver.  Recent liver panel wnl.

## 2013-09-19 NOTE — Assessment & Plan Note (Addendum)
Blood pressure doing well.  Decreased zebeta last visit.  Now only on 5mg  q day.  Pulse rate better.  Follow.

## 2013-09-19 NOTE — Assessment & Plan Note (Signed)
Persistent pain and swelling s/p injury.  Will xray.  Post op shoe.  Further w/up pending results.

## 2013-09-19 NOTE — Assessment & Plan Note (Signed)
On simvastatin.  Low cholesterol diet and exercise.  Follow lipid panel and liver function.      

## 2013-09-19 NOTE — Assessment & Plan Note (Signed)
On zoloft 50mg  q day.  Feels better.  Discussed counseling/psychiatry referral.  She declines.  Will follow. Closely.  Notify me if symptoms change or worsen.  She feels this dose is adequate.  Desires not to change at this time.   Feels better.

## 2013-09-27 ENCOUNTER — Other Ambulatory Visit: Payer: Self-pay | Admitting: *Deleted

## 2013-09-28 ENCOUNTER — Other Ambulatory Visit: Payer: Self-pay | Admitting: *Deleted

## 2013-09-28 MED ORDER — BISOPROLOL FUMARATE 5 MG PO TABS: ORAL_TABLET | ORAL | Status: DC

## 2013-10-02 ENCOUNTER — Encounter: Payer: Self-pay | Admitting: Podiatry

## 2013-10-02 ENCOUNTER — Ambulatory Visit (INDEPENDENT_AMBULATORY_CARE_PROVIDER_SITE_OTHER): Payer: 59 | Admitting: Podiatry

## 2013-10-02 ENCOUNTER — Ambulatory Visit (INDEPENDENT_AMBULATORY_CARE_PROVIDER_SITE_OTHER): Payer: 59

## 2013-10-02 VITALS — BP 130/70 | HR 60 | Resp 18 | Ht 63.5 in | Wt 180.0 lb

## 2013-10-02 DIAGNOSIS — S92919A Unspecified fracture of unspecified toe(s), initial encounter for closed fracture: Secondary | ICD-10-CM

## 2013-10-02 DIAGNOSIS — M2010 Hallux valgus (acquired), unspecified foot: Secondary | ICD-10-CM

## 2013-10-02 NOTE — Progress Notes (Signed)
   Subjective:    Patient ID: Lindsey Harmon, female    DOB: [birth year], 68 y.o.   MRN: [id number]  HPI Comments: Fractured my right great toe , it happened about 3 weeks ago. i got my toe caught in the leg of a chair and fell. It throbbed . Been icing, soaking . Went to dr and she sent me for xrays and they said it was fractured      Review of Systems  All other systems reviewed and are negative.      Objective:   Physical Exam        Assessment & Plan:

## 2013-10-02 NOTE — Progress Notes (Signed)
Subjective:     Patient ID: Lindsey Harmon, female   DOB: 1945/10/11, 68 y.o.   MRN: [id number]  HPI patient states I broke my big toe on my right foot 3 weeks ago and is still sore I also have bunion on the left with keratotic lesion formation   Review of Systems  All other systems reviewed and are negative.      Objective:   Physical Exam  Nursing note and vitals reviewed. Constitutional: She is oriented to person, place, and time.  Cardiovascular: Intact distal pulses.   Musculoskeletal: Normal range of motion.  Neurological: She is oriented to person, place, and time.  Skin: Skin is warm.   neurovascular status is found to be intact with normal range of motion subtalar midtarsal joint and normal muscle strength of the muscle groups into the foot noted. Patient is found to have pain and discomfort on the medial side of the right hallux at the interphalangeal joint and keratotic lesion underneath the left first metatarsal with a structural deformity of the left first metatarsal and redness around the bunion site     Assessment:     Fracture of the right big toe and structural bunion creating keratotic lesion formation left    Plan:     H&P performed and x-ray reviewed right foot. Advised that healing will occur but will probably take another 6 weeks and I then discussed bunion correction left review we and what we can do for this particular condition. She will hold off and we can decide in the future

## 2013-10-08 ENCOUNTER — Other Ambulatory Visit: Payer: Self-pay | Admitting: Internal Medicine

## 2013-10-16 ENCOUNTER — Encounter: Payer: Self-pay | Admitting: Internal Medicine

## 2013-12-13 ENCOUNTER — Other Ambulatory Visit: Payer: Self-pay | Admitting: Internal Medicine

## 2013-12-17 ENCOUNTER — Encounter: Payer: Self-pay | Admitting: Internal Medicine

## 2013-12-17 ENCOUNTER — Other Ambulatory Visit (INDEPENDENT_AMBULATORY_CARE_PROVIDER_SITE_OTHER): Payer: Medicare Other

## 2013-12-17 DIAGNOSIS — K769 Liver disease, unspecified: Secondary | ICD-10-CM

## 2013-12-17 DIAGNOSIS — E7800 Pure hypercholesterolemia, unspecified: Secondary | ICD-10-CM

## 2013-12-17 DIAGNOSIS — R945 Abnormal results of liver function studies: Secondary | ICD-10-CM

## 2013-12-17 DIAGNOSIS — I10 Essential (primary) hypertension: Secondary | ICD-10-CM

## 2013-12-17 LAB — LIPID PANEL
Cholesterol: 165 mg/dL (ref 0–200)
HDL: 37.4 mg/dL — ABNORMAL LOW (ref >39.00)
NonHDL: 127.6
TRIGLYCERIDES: 204 mg/dL — AB (ref 0.0–149.0)
Total CHOL/HDL Ratio: 4
VLDL: 40.8 mg/dL — AB (ref 0.0–40.0)

## 2013-12-17 LAB — HEPATIC FUNCTION PANEL
ALBUMIN: 3.9 g/dL (ref 3.5–5.2)
ALT: 40 U/L — AB (ref 0–35)
AST: 41 U/L — ABNORMAL HIGH (ref 0–37)
Alkaline Phosphatase: 51 U/L (ref 39–117)
BILIRUBIN DIRECT: 0.1 mg/dL (ref 0.0–0.3)
TOTAL PROTEIN: 7.1 g/dL (ref 6.0–8.3)
Total Bilirubin: 0.6 mg/dL (ref 0.2–1.2)

## 2013-12-17 LAB — BASIC METABOLIC PANEL
BUN: 23 mg/dL (ref 6–23)
CHLORIDE: 99 meq/L (ref 96–112)
CO2: 27 mEq/L (ref 19–32)
Calcium: 9.4 mg/dL (ref 8.4–10.5)
Creatinine, Ser: 1.3 mg/dL — ABNORMAL HIGH (ref 0.4–1.2)
GFR: 42.97 mL/min — AB (ref >60.00)
Glucose, Bld: 88 mg/dL (ref 70–99)
POTASSIUM: 3.6 meq/L (ref 3.5–5.1)
SODIUM: 134 meq/L — AB (ref 135–145)

## 2013-12-17 LAB — LDL CHOLESTEROL, DIRECT: LDL DIRECT: 110.6 mg/dL

## 2013-12-21 ENCOUNTER — Ambulatory Visit (INDEPENDENT_AMBULATORY_CARE_PROVIDER_SITE_OTHER): Payer: Medicare Other | Admitting: Internal Medicine

## 2013-12-21 ENCOUNTER — Telehealth: Payer: Self-pay | Admitting: Internal Medicine

## 2013-12-21 ENCOUNTER — Encounter: Payer: Self-pay | Admitting: Internal Medicine

## 2013-12-21 VITALS — BP 120/80 | HR 62 | Temp 98.4°F | Ht 63.5 in | Wt 179.2 lb

## 2013-12-21 DIAGNOSIS — N289 Disorder of kidney and ureter, unspecified: Secondary | ICD-10-CM

## 2013-12-21 DIAGNOSIS — R945 Abnormal results of liver function studies: Secondary | ICD-10-CM

## 2013-12-21 DIAGNOSIS — M79609 Pain in unspecified limb: Secondary | ICD-10-CM

## 2013-12-21 DIAGNOSIS — K769 Liver disease, unspecified: Secondary | ICD-10-CM

## 2013-12-21 DIAGNOSIS — I498 Other specified cardiac arrhythmias: Secondary | ICD-10-CM

## 2013-12-21 DIAGNOSIS — I10 Essential (primary) hypertension: Secondary | ICD-10-CM

## 2013-12-21 DIAGNOSIS — M79674 Pain in right toe(s): Secondary | ICD-10-CM

## 2013-12-21 DIAGNOSIS — R001 Bradycardia, unspecified: Secondary | ICD-10-CM

## 2013-12-21 DIAGNOSIS — F429 Obsessive-compulsive disorder, unspecified: Secondary | ICD-10-CM

## 2013-12-21 DIAGNOSIS — E7800 Pure hypercholesterolemia, unspecified: Secondary | ICD-10-CM

## 2013-12-21 DIAGNOSIS — Z87440 Personal history of urinary (tract) infections: Secondary | ICD-10-CM

## 2013-12-21 LAB — HEPATIC FUNCTION PANEL
ALT: 44 U/L — AB (ref 0–35)
AST: 43 U/L — ABNORMAL HIGH (ref 0–37)
Albumin: 4.1 g/dL (ref 3.5–5.2)
Alkaline Phosphatase: 53 U/L (ref 39–117)
BILIRUBIN TOTAL: 1.1 mg/dL (ref 0.2–1.2)
Bilirubin, Direct: 0.1 mg/dL (ref 0.0–0.3)
Total Protein: 7.5 g/dL (ref 6.0–8.3)

## 2013-12-21 LAB — BASIC METABOLIC PANEL
BUN: 17 mg/dL (ref 6–23)
CO2: 28 mEq/L (ref 19–32)
CREATININE: 1.4 mg/dL — AB (ref 0.4–1.2)
Calcium: 10 mg/dL (ref 8.4–10.5)
Chloride: 100 mEq/L (ref 96–112)
GFR: 39.12 mL/min — ABNORMAL LOW (ref >60.00)
Glucose, Bld: 100 mg/dL — ABNORMAL HIGH (ref 70–99)
POTASSIUM: 3.7 meq/L (ref 3.5–5.1)
Sodium: 137 mEq/L (ref 135–145)

## 2013-12-21 MED ORDER — SERTRALINE HCL 50 MG PO TABS: ORAL_TABLET | ORAL | Status: DC

## 2013-12-21 NOTE — Progress Notes (Signed)
Pre visit review using our clinic review tool, if applicable. No additional management support is needed unless otherwise documented below in the visit note. 

## 2013-12-21 NOTE — Telephone Encounter (Signed)
Pt notified of labs and need for f/u lab.  She wants to come in 12/31/13 at 10:00.  Pt aware of appt.  Please put on lab schedule.  Thanks.

## 2013-12-23 ENCOUNTER — Encounter: Payer: Self-pay | Admitting: Internal Medicine

## 2013-12-23 DIAGNOSIS — N289 Disorder of kidney and ureter, unspecified: Secondary | ICD-10-CM | POA: Insufficient documentation

## 2013-12-23 NOTE — Assessment & Plan Note (Signed)
Was worked up by GI.  Felt to be due to fatty liver.  Recent liver function tests elevated.  Repeat today.

## 2013-12-23 NOTE — Assessment & Plan Note (Signed)
On simvastatin.  Low cholesterol diet and exercise.  Follow lipid panel and liver function.      

## 2013-12-23 NOTE — Assessment & Plan Note (Signed)
Blood pressure doing well.  Pulse rate better.  Follow.

## 2013-12-23 NOTE — Assessment & Plan Note (Signed)
Pulse rate better.  Blood pressure controlled.   Follow.

## 2013-12-23 NOTE — Assessment & Plan Note (Signed)
Had a toe fracture.  Better now.  No pain.

## 2013-12-23 NOTE — Assessment & Plan Note (Signed)
Cr as outlined.  Slight elevation.  Stay hydrated.  Recheck Cr.

## 2013-12-23 NOTE — Assessment & Plan Note (Signed)
On Bactirm now.  Planning to start taking macrobid again.  Doing well.

## 2013-12-23 NOTE — Assessment & Plan Note (Signed)
On zoloft 50mg  q day.  Feels better.  Discussed counseling/psychiatry referral.  Increased stress.  Will increase zoloft to 50mg  1 1/2 q day.  Follow.

## 2013-12-23 NOTE — Progress Notes (Signed)
Subjective:    Patient ID: SOL ODOR, female    DOB: 30-Mar-1946, 68 y.o.   MRN: [id number]  HPI 68 year old female with past history of hypercholesterolemia, hypertension and abnormal liver function who comes in today for a scheduled follow up.   Eating and drinking well.  No nausea or vomiting.  No bowel change.  No chest pain or tightness.  No sob. No acid reflux.  Bowels stable.  She had reported at a previous visit that she has some obsessive compulsive tendencies.  See previous note for details.  Was started on zoloft for her increased anxiety.  She declined counseling.  She comes in today stating that she feels better.  Anxiety better.  Some increased stress with her granddaughter's health issues.  Feels she may need to increase the dose of the medication.  Discussed again counseling/psychiatry referral.  She declines.  No dizziness or light headedness.  Doing well.  Pulse better.  Feels better.  Previous right great toe fracture.  Seeing Dr Ouida Sills now.  On bactrim for reoccurring uti's.  Planning to go back on macrobid.    Past Medical History  Diagnosis Date  . Hypertension   . Pure hypercholesterolemia   . GERD (gastroesophageal reflux disease)   . Urinary retention   . Fibrocystic breast disease   . Abnormal liver function     Outpatient Encounter Prescriptions as of 12/21/2013  Medication Sig  . bisoprolol (ZEBETA) 5 MG tablet TAKE 1 TABLET BY MOUTH DAILY.  . Calcium Carbonate-Vitamin D (CALTRATE 600+D PO) Take by mouth.  . hydrochlorothiazide (HYDRODIURIL) 25 MG tablet Take 1 tablet (25 mg total) by mouth daily.  Marland Kitchen losartan (COZAAR) 100 MG tablet Take 1 tablet (100 mg total) by mouth daily.  . Omega-3 Fatty Acids (FISH OIL) 1200 MG CAPS Take by mouth daily.  Marland Kitchen omeprazole (PRILOSEC) 20 MG capsule Take 1 capsule (20 mg total) by mouth 2 (two) times daily.  . sertraline (ZOLOFT) 50 MG tablet Take 1 1/2 tablets q day  . simvastatin (ZOCOR) 20 MG tablet TAKE 1 TABLET (20 MG  TOTAL) BY MOUTH DAILY.  Marland Kitchen sulfamethoxazole-trimethoprim (BACTRIM DS) 800-160 MG per tablet Take 1 tablet by mouth once.  . [DISCONTINUED] sertraline (ZOLOFT) 50 MG tablet Take 1 tablet (50 mg total) by mouth daily.  . nitrofurantoin, macrocrystal-monohydrate, (MACROBID) 100 MG capsule Take 100 mg by mouth daily.    Review of Systems Patient denies any headache, lightheadedness or dizziness.  No sinus or allergy symptoms.  No chest pain, tightness or palpitations.  No increased shortness of breath, cough or congestion.  No nausea or vomiting. No acid reflux.  No abdominal pain or cramping.  No bowel change, such as diarrhea, constipation, BRBPR or melana.  No urine change.  Doing better on the zoloft.  Feels need to increase the dose as outlined.  On bactrim.        Objective:   Physical Exam  Filed Vitals:   12/21/13 0900  BP: 120/80  Pulse: 62  Temp: 98.4 F (36.9 C)   Blood pressure recheck:  120/78, pulse 64  69 year old female in no acute distress.   HEENT:  Nares- clear.  Oropharynx - without lesions. NECK:  Supple.  Nontender.  No audible bruit.  HEART:  Appears to be regular. LUNGS:  No crackles or wheezing audible.  Respirations even and unlabored.  RADIAL PULSE:  Equal bilaterally.  ABDOMEN:  Soft, nontender.  Bowel sounds present and normal.  No audible  abdominal bruit.    EXTREMITIES:  No increased edema present.  DP pulses palpable and equal bilaterally.        Assessment & Plan:  GYN.  Has been seeing Dr Amalia Hailey.  Up to date.  Now seeing Dr Ouida Sills.      HEALTH MAINTENANCE.  Pelvic/pap through gyn.  Declines colonoscopy.  I have discussed the need for colonoscopy.  Stressed the importance of early screening.  She will notify me when she is agreeable.  Mammogram 05/22/13 - ok.

## 2013-12-31 ENCOUNTER — Other Ambulatory Visit (INDEPENDENT_AMBULATORY_CARE_PROVIDER_SITE_OTHER): Payer: Medicare Other

## 2013-12-31 ENCOUNTER — Telehealth: Payer: Self-pay | Admitting: Internal Medicine

## 2013-12-31 ENCOUNTER — Encounter: Payer: Self-pay | Admitting: Internal Medicine

## 2013-12-31 DIAGNOSIS — N289 Disorder of kidney and ureter, unspecified: Secondary | ICD-10-CM

## 2013-12-31 LAB — BASIC METABOLIC PANEL
BUN: 15 mg/dL (ref 6–23)
CO2: 26 mEq/L (ref 19–32)
CREATININE: 1.3 mg/dL — AB (ref 0.4–1.2)
Calcium: 9.5 mg/dL (ref 8.4–10.5)
Chloride: 103 mEq/L (ref 96–112)
GFR: 44.95 mL/min — ABNORMAL LOW (ref >60.00)
GLUCOSE: 86 mg/dL (ref 70–99)
POTASSIUM: 4 meq/L (ref 3.5–5.1)
Sodium: 138 mEq/L (ref 135–145)

## 2013-12-31 NOTE — Telephone Encounter (Signed)
Pt notified of lab results via my chart.  Needs non fasting lab appt in 4 weeks.  Please schedule and contact her with an appt date and time.  Thanks.  Dr Nicki Reaper

## 2014-01-04 ENCOUNTER — Other Ambulatory Visit: Payer: Self-pay | Admitting: Internal Medicine

## 2014-01-29 ENCOUNTER — Other Ambulatory Visit (INDEPENDENT_AMBULATORY_CARE_PROVIDER_SITE_OTHER): Payer: Medicare Other

## 2014-01-29 DIAGNOSIS — N289 Disorder of kidney and ureter, unspecified: Secondary | ICD-10-CM

## 2014-01-29 LAB — URINALYSIS, ROUTINE W REFLEX MICROSCOPIC
Bilirubin Urine: NEGATIVE
HGB URINE DIPSTICK: NEGATIVE
KETONES UR: NEGATIVE
Nitrite: NEGATIVE
Total Protein, Urine: NEGATIVE
Urine Glucose: NEGATIVE
Urobilinogen, UA: 0.2 (ref 0.0–1.0)
pH: 6 (ref 5.0–8.0)

## 2014-01-29 LAB — BASIC METABOLIC PANEL
BUN: 16 mg/dL (ref 6–23)
CO2: 28 mEq/L (ref 19–32)
Calcium: 9.2 mg/dL (ref 8.4–10.5)
Chloride: 101 mEq/L (ref 96–112)
Creatinine, Ser: 1 mg/dL (ref 0.4–1.2)
GFR: 61.69 mL/min (ref >60.00)
GLUCOSE: 89 mg/dL (ref 70–99)
Potassium: 3.5 mEq/L (ref 3.5–5.1)
Sodium: 136 mEq/L (ref 135–145)

## 2014-01-30 ENCOUNTER — Encounter: Payer: Self-pay | Admitting: Internal Medicine

## 2014-02-13 ENCOUNTER — Other Ambulatory Visit: Payer: Self-pay | Admitting: Internal Medicine

## 2014-02-26 ENCOUNTER — Ambulatory Visit (INDEPENDENT_AMBULATORY_CARE_PROVIDER_SITE_OTHER): Payer: Medicare Other | Admitting: Internal Medicine

## 2014-02-26 ENCOUNTER — Encounter: Payer: Self-pay | Admitting: Internal Medicine

## 2014-02-26 VITALS — BP 130/80 | HR 56 | Temp 98.3°F | Ht 63.5 in | Wt 184.5 lb

## 2014-02-26 DIAGNOSIS — E7800 Pure hypercholesterolemia, unspecified: Secondary | ICD-10-CM

## 2014-02-26 DIAGNOSIS — R001 Bradycardia, unspecified: Secondary | ICD-10-CM

## 2014-02-26 DIAGNOSIS — F429 Obsessive-compulsive disorder, unspecified: Secondary | ICD-10-CM

## 2014-02-26 DIAGNOSIS — F42 Obsessive-compulsive disorder: Secondary | ICD-10-CM

## 2014-02-26 DIAGNOSIS — K7689 Other specified diseases of liver: Secondary | ICD-10-CM

## 2014-02-26 DIAGNOSIS — N289 Disorder of kidney and ureter, unspecified: Secondary | ICD-10-CM

## 2014-02-26 DIAGNOSIS — R945 Abnormal results of liver function studies: Secondary | ICD-10-CM

## 2014-02-26 DIAGNOSIS — I10 Essential (primary) hypertension: Secondary | ICD-10-CM

## 2014-02-26 MED ORDER — BISOPROLOL FUMARATE 5 MG PO TABS: ORAL_TABLET | ORAL | Status: DC

## 2014-02-26 NOTE — Progress Notes (Signed)
Pre visit review using our clinic review tool, if applicable. No additional management support is needed unless otherwise documented below in the visit note. 

## 2014-02-27 ENCOUNTER — Encounter: Payer: Self-pay | Admitting: Internal Medicine

## 2014-02-27 NOTE — Assessment & Plan Note (Signed)
Was worked up by GI.  Felt to be due to fatty liver.  Recent liver function tests stable.  Follow.

## 2014-02-27 NOTE — Assessment & Plan Note (Signed)
Last renal function improved.  On 1/2 hctz now.  Follow.

## 2014-02-27 NOTE — Assessment & Plan Note (Signed)
On zoloft 50mg  1 1/2 q day.  Feels better.  Discussed counseling/psychiatry referral.  She desires not to pursue this at this time.  Follow.

## 2014-02-27 NOTE — Assessment & Plan Note (Signed)
On simvastatin.  Low cholesterol diet and exercise.  Follow lipid panel and liver function.      

## 2014-02-27 NOTE — Assessment & Plan Note (Signed)
On 1/2 of hctz now.  Taking zebeta and losartan.  Blood pressure doing well.  Recent renal function better.  Follow.

## 2014-02-27 NOTE — Progress Notes (Signed)
Subjective:    Patient ID: Lindsey Harmon, female    DOB: Nov 03, 1945, 68 y.o.   MRN: [id number]  HPI 68 year old female with past history of hypercholesterolemia, hypertension and abnormal liver function who comes in today for a scheduled follow up.   Eating and drinking well.  No nausea or vomiting.  No bowel change.  No chest pain or tightness.  No sob. No acid reflux.  Bowels stable.  She had reported at a previous visit that she has some obsessive compulsive tendencies.  See previous note for details.  Started on zoloft.  Does increased last visit.  She feels she is dong better.  Discussed again counseling/psychiatry referral.  She declines.  No dizziness or light headedness.  Doing well.  Pulse better.  Feels better.  Blood pressure averaging 120-130s/70-80. Seeing Dr Ouida Sills.  Due to see him next week for her gyn evaluation (breast and pelvic).     Past Medical History  Diagnosis Date  . Hypertension   . Pure hypercholesterolemia   . GERD (gastroesophageal reflux disease)   . Urinary retention   . Fibrocystic breast disease   . Abnormal liver function     Outpatient Encounter Prescriptions as of 02/26/2014  Medication Sig  . bisoprolol (ZEBETA) 5 MG tablet TAKE 1 TABLET BY MOUTH DAILY.  . Calcium Carbonate-Vitamin D (CALTRATE 600+D PO) Take by mouth.  . hydrochlorothiazide (HYDRODIURIL) 25 MG tablet Take 12.5 mg by mouth daily.  Marland Kitchen losartan (COZAAR) 100 MG tablet Take 1 tablet (100 mg total) by mouth daily.  . nitrofurantoin, macrocrystal-monohydrate, (MACROBID) 100 MG capsule Take 100 mg by mouth daily.  . Omega-3 Fatty Acids (FISH OIL) 1200 MG CAPS Take by mouth daily.  Marland Kitchen omeprazole (PRILOSEC) 20 MG capsule Take 1 capsule (20 mg total) by mouth 2 (two) times daily.  . sertraline (ZOLOFT) 50 MG tablet Take 1 1/2 tablets q day  . simvastatin (ZOCOR) 20 MG tablet TAKE 1 TABLET (20 MG TOTAL) BY MOUTH DAILY.  Marland Kitchen sulfamethoxazole-trimethoprim (BACTRIM DS) 800-160 MG per tablet Take 1  tablet by mouth once.  . [DISCONTINUED] bisoprolol (ZEBETA) 5 MG tablet TAKE 1 TABLET BY MOUTH DAILY.  . [DISCONTINUED] hydrochlorothiazide (HYDRODIURIL) 25 MG tablet Take 1 tablet (25 mg total) by mouth daily.  . [DISCONTINUED] bisoprolol (ZEBETA) 5 MG tablet TAKE 1 TABLET BY MOUTH EVERY DAY    Review of Systems Patient denies any headache, lightheadedness or dizziness.  No sinus or allergy symptoms.  No chest pain, tightness or palpitations.  No increased shortness of breath, cough or congestion.  No nausea or vomiting. No acid reflux.  No abdominal pain or cramping.  No bowel change, such as diarrhea, constipation, BRBPR or melana.  No urine change.  Doing better on the zoloft.  This dose working well for her.  Declines counselor.        Objective:   Physical Exam  Filed Vitals:   02/26/14 1028  BP: 130/80  Pulse: 56  Temp: 98.3 F (36.8 C)   Blood pressure recheck:  1-58/8  68 year old female in no acute distress.   HEENT:  Nares- clear.  Oropharynx - without lesions. NECK:  Supple.  Nontender.  No audible bruit.  HEART:  Appears to be regular. LUNGS:  No crackles or wheezing audible.  Respirations even and unlabored.  RADIAL PULSE:  Equal bilaterally.  ABDOMEN:  Soft, nontender.  Bowel sounds present and normal.  No audible abdominal bruit.    EXTREMITIES:  No increased edema  present.  DP pulses palpable and equal bilaterally.        Assessment & Plan:  GYN.  Has been seeing Dr Amalia Hailey.  Up to date.  Now planning to see Dr Ouida Sills.   Has physical scheduled there next week.   HEALTH MAINTENANCE.  Pelvic/pap through gyn.  Declines colonoscopy.  I have discussed the need for colonoscopy.  Stressed the importance of early screening.  She will notify me when she is agreeable.  Mammogram 05/22/13 - ok.    Problem List Items Addressed This Visit   Abnormal liver function     Was worked up by GI.  Felt to be due to fatty liver.  Recent liver function tests stable.  Follow.        Bradycardia     Pulse rate better.  Blood pressure controlled.   Follow.       Essential hypertension, benign - Primary     On 1/2 of hctz now.  Taking zebeta and losartan.  Blood pressure doing well.  Recent renal function better.  Follow.     Relevant Medications      hydrochlorothiazide (HYDRODIURIL) 25 MG tablet      bisoprolol (ZEBETA) tablet   Hypercholesterolemia     On simvastatin.  Low cholesterol diet and exercise.  Follow lipid panel and liver function.      Relevant Medications      hydrochlorothiazide (HYDRODIURIL) 25 MG tablet      bisoprolol (ZEBETA) tablet   Obsessive compulsive disorder     On zoloft 50mg  1 1/2 q day.  Feels better.  Discussed counseling/psychiatry referral.  She desires not to pursue this at this time.  Follow.       Renal insufficiency     Last renal function improved.  On 1/2 hctz now.  Follow.

## 2014-02-27 NOTE — Assessment & Plan Note (Signed)
Pulse rate better.  Blood pressure controlled.   Follow.

## 2014-03-16 ENCOUNTER — Other Ambulatory Visit: Payer: Self-pay | Admitting: Internal Medicine

## 2014-05-23 ENCOUNTER — Ambulatory Visit: Payer: Self-pay | Admitting: Obstetrics and Gynecology

## 2014-05-23 LAB — HM MAMMOGRAPHY: HM MAMMO: NEGATIVE

## 2014-05-28 ENCOUNTER — Other Ambulatory Visit (INDEPENDENT_AMBULATORY_CARE_PROVIDER_SITE_OTHER): Payer: Medicare Other

## 2014-05-28 ENCOUNTER — Encounter: Payer: Self-pay | Admitting: Internal Medicine

## 2014-05-28 DIAGNOSIS — K7689 Other specified diseases of liver: Secondary | ICD-10-CM

## 2014-05-28 DIAGNOSIS — R945 Abnormal results of liver function studies: Secondary | ICD-10-CM

## 2014-05-28 DIAGNOSIS — E7800 Pure hypercholesterolemia, unspecified: Secondary | ICD-10-CM

## 2014-05-28 DIAGNOSIS — N289 Disorder of kidney and ureter, unspecified: Secondary | ICD-10-CM

## 2014-05-28 DIAGNOSIS — F429 Obsessive-compulsive disorder, unspecified: Secondary | ICD-10-CM

## 2014-05-28 DIAGNOSIS — I10 Essential (primary) hypertension: Secondary | ICD-10-CM

## 2014-05-28 DIAGNOSIS — F42 Obsessive-compulsive disorder: Secondary | ICD-10-CM

## 2014-05-28 LAB — HEPATIC FUNCTION PANEL
ALK PHOS: 70 U/L (ref 39–117)
ALT: 33 U/L (ref 0–35)
AST: 30 U/L (ref 0–37)
Albumin: 3.9 g/dL (ref 3.5–5.2)
Bilirubin, Direct: 0.1 mg/dL (ref 0.0–0.3)
Total Bilirubin: 0.9 mg/dL (ref 0.2–1.2)
Total Protein: 7.2 g/dL (ref 6.0–8.3)

## 2014-05-28 LAB — LIPID PANEL
CHOLESTEROL: 187 mg/dL (ref 0–200)
HDL: 38.9 mg/dL — ABNORMAL LOW (ref >39.00)
NonHDL: 148.1
Total CHOL/HDL Ratio: 5
Triglycerides: 227 mg/dL — ABNORMAL HIGH (ref 0.0–149.0)
VLDL: 45.4 mg/dL — AB (ref 0.0–40.0)

## 2014-05-28 LAB — CBC WITH DIFFERENTIAL/PLATELET
BASOS ABS: 0.1 10*3/uL (ref 0.0–0.1)
Basophils Relative: 0.7 % (ref 0.0–3.0)
EOS ABS: 0.1 10*3/uL (ref 0.0–0.7)
Eosinophils Relative: 1.6 % (ref 0.0–5.0)
HCT: 41.4 % (ref 36.0–46.0)
HEMOGLOBIN: 13.6 g/dL (ref 12.0–15.0)
LYMPHS PCT: 31.1 % (ref 12.0–46.0)
Lymphs Abs: 2.6 10*3/uL (ref 0.7–4.0)
MCHC: 32.8 g/dL (ref 30.0–36.0)
MCV: 88.9 fl (ref 78.0–100.0)
Monocytes Absolute: 0.5 10*3/uL (ref 0.1–1.0)
Monocytes Relative: 5.8 % (ref 3.0–12.0)
Neutro Abs: 5 10*3/uL (ref 1.4–7.7)
Neutrophils Relative %: 60.8 % (ref 43.0–77.0)
PLATELETS: 239 10*3/uL (ref 150.0–400.0)
RBC: 4.66 Mil/uL (ref 3.87–5.11)
RDW: 13.9 % (ref 11.5–15.5)
WBC: 8.3 10*3/uL (ref 4.0–10.5)

## 2014-05-28 LAB — BASIC METABOLIC PANEL
BUN: 17 mg/dL (ref 6–23)
CO2: 30 mEq/L (ref 19–32)
Calcium: 9 mg/dL (ref 8.4–10.5)
Chloride: 100 mEq/L (ref 96–112)
Creatinine, Ser: 1 mg/dL (ref 0.4–1.2)
GFR: 58.77 mL/min — AB (ref >60.00)
Glucose, Bld: 106 mg/dL — ABNORMAL HIGH (ref 70–99)
POTASSIUM: 3.7 meq/L (ref 3.5–5.1)
Sodium: 135 mEq/L (ref 135–145)

## 2014-05-28 LAB — TSH: TSH: 3.79 u[IU]/mL (ref 0.35–4.50)

## 2014-05-28 LAB — LDL CHOLESTEROL, DIRECT: LDL DIRECT: 102.3 mg/dL

## 2014-05-29 ENCOUNTER — Encounter: Payer: Self-pay | Admitting: Internal Medicine

## 2014-05-29 ENCOUNTER — Other Ambulatory Visit (INDEPENDENT_AMBULATORY_CARE_PROVIDER_SITE_OTHER): Payer: Medicare Other

## 2014-05-29 ENCOUNTER — Other Ambulatory Visit: Payer: Self-pay | Admitting: Internal Medicine

## 2014-05-29 DIAGNOSIS — R739 Hyperglycemia, unspecified: Secondary | ICD-10-CM

## 2014-05-29 LAB — HEMOGLOBIN A1C: Hgb A1c MFr Bld: 6.4 % (ref 4.6–6.5)

## 2014-05-29 NOTE — Progress Notes (Signed)
Order placed for a1c add on.

## 2014-05-30 ENCOUNTER — Ambulatory Visit (INDEPENDENT_AMBULATORY_CARE_PROVIDER_SITE_OTHER): Payer: Medicare Other | Admitting: Internal Medicine

## 2014-05-30 ENCOUNTER — Encounter: Payer: Self-pay | Admitting: Internal Medicine

## 2014-05-30 VITALS — BP 130/80 | HR 56 | Temp 98.4°F | Ht 63.5 in | Wt 183.0 lb

## 2014-05-30 DIAGNOSIS — E7800 Pure hypercholesterolemia, unspecified: Secondary | ICD-10-CM

## 2014-05-30 DIAGNOSIS — F429 Obsessive-compulsive disorder, unspecified: Secondary | ICD-10-CM

## 2014-05-30 DIAGNOSIS — I10 Essential (primary) hypertension: Secondary | ICD-10-CM

## 2014-05-30 DIAGNOSIS — R945 Abnormal results of liver function studies: Secondary | ICD-10-CM

## 2014-05-30 DIAGNOSIS — E669 Obesity, unspecified: Secondary | ICD-10-CM

## 2014-05-30 DIAGNOSIS — N289 Disorder of kidney and ureter, unspecified: Secondary | ICD-10-CM

## 2014-05-30 DIAGNOSIS — K7689 Other specified diseases of liver: Secondary | ICD-10-CM

## 2014-05-30 DIAGNOSIS — F42 Obsessive-compulsive disorder: Secondary | ICD-10-CM

## 2014-05-30 DIAGNOSIS — R739 Hyperglycemia, unspecified: Secondary | ICD-10-CM

## 2014-05-30 NOTE — Progress Notes (Signed)
Pre visit review using our clinic review tool, if applicable. No additional management support is needed unless otherwise documented below in the visit note. 

## 2014-05-30 NOTE — Progress Notes (Signed)
Subjective:    Patient ID: Lindsey Harmon, female    DOB: 12/07/1945, 69 y.o.   MRN: [id number]  HPI 69 year old female with past history of hypercholesterolemia, hypertension and abnormal liver function who comes in today for a scheduled follow up.   Eating and drinking well.  No nausea or vomiting.  No bowel change.  No chest pain or tightness.  No sob. No acid reflux.  Bowels stable.  She had reported at a previous visit that she has some obsessive compulsive tendencies.  See previous notes for details.  Started on zoloft.   She feels she is dong better.  Discussed again counseling/psychiatry referral.  She declines.  No dizziness or light headedness.  Doing well.  Pulse better.  Feels better.  Blood pressure averaging has been doing well.  Seeing Dr Ouida Sills.  Up to date with gyn exams.  Seen 02/2014.  States everything checked out fine.      Past Medical History  Diagnosis Date  . Hypertension   . Pure hypercholesterolemia   . GERD (gastroesophageal reflux disease)   . Urinary retention   . Fibrocystic breast disease   . Abnormal liver function     Outpatient Encounter Prescriptions as of 05/30/2014  Medication Sig  . bisoprolol (ZEBETA) 5 MG tablet TAKE 1 TABLET BY MOUTH DAILY.  . Calcium Carbonate-Vitamin D (CALTRATE 600+D PO) Take by mouth.  . hydrochlorothiazide (HYDRODIURIL) 25 MG tablet TAKE 1 TABLET (25 MG TOTAL) BY MOUTH DAILY. (Patient taking differently: TAKE 1/2 TABLET (12.5 MG TOTAL) BY MOUTH DAILY.)  . losartan (COZAAR) 100 MG tablet TAKE 1 TABLET (100 MG TOTAL) BY MOUTH DAILY.  . nitrofurantoin, macrocrystal-monohydrate, (MACROBID) 100 MG capsule Take 100 mg by mouth daily.  . Omega-3 Fatty Acids (FISH OIL) 1200 MG CAPS Take by mouth daily.  Marland Kitchen omeprazole (PRILOSEC) 20 MG capsule TAKE 1 CAPSULE (20 MG TOTAL) BY MOUTH 2 (TWO) TIMES DAILY.  Marland Kitchen sertraline (ZOLOFT) 50 MG tablet Take 1 1/2 tablets q day  . simvastatin (ZOCOR) 20 MG tablet TAKE 1 TABLET (20 MG TOTAL) BY MOUTH  DAILY.  . [DISCONTINUED] hydrochlorothiazide (HYDRODIURIL) 25 MG tablet Take 12.5 mg by mouth daily.  . [DISCONTINUED] sulfamethoxazole-trimethoprim (BACTRIM DS) 800-160 MG per tablet Take 1 tablet by mouth once.    Review of Systems Patient denies any headache, lightheadedness or dizziness.  No sinus or allergy symptoms.  No chest pain, tightness or palpitations. No increased shortness of breath, cough or congestion.  No nausea or vomiting. No acid reflux.  No abdominal pain or cramping.  No bowel change, such as diarrhea, constipation, BRBPR or melana.  No urine change.  Doing better on the zoloft.  This dose working well for her.  Declines counselor.  Overall feels good.  We discussed diet and exercise.        Objective:   Physical Exam  Filed Vitals:   05/30/14 0858  BP: 130/80  Pulse: 56  Temp: 98.4 F (36.9 C)   Blood pressure recheck:  56/19  69 year old female in no acute distress.   HEENT:  Nares- clear.  Oropharynx - without lesions. NECK:  Supple.  Nontender.  No audible bruit.  HEART:  Appears to be regular. LUNGS:  No crackles or wheezing audible.  Respirations even and unlabored.  RADIAL PULSE:  Equal bilaterally.  ABDOMEN:  Soft, nontender.  Bowel sounds present and normal.  No audible abdominal bruit.    EXTREMITIES:  No increased edema present.  DP pulses  palpable and equal bilaterally.        Assessment & Plan:  1. Obesity (BMI 30-39.9) Diet and exercise.    2. Essential hypertension, benign Blood pressure doing well.  Same medication regimen.   - Basic metabolic panel; Future  3. Renal insufficiency Cr stable.  Follow.    4. Obsessive compulsive disorder On zoloft.  Stable.  Desires not to be referred to psychiatry.    5. Abnormal liver function Liver panel just checked and wnl.    6. Hypercholesterolemia Low cholesterol diet and exercise.  On simvastatin.   - Lipid panel; Future - Hepatic function panel; Future Lab Results  Component Value  Date   CHOL 187 05/28/2014   HDL 38.90* 05/28/2014   LDLCALC 87 01/30/2013   LDLDIRECT 102.3 05/28/2014   TRIG 227.0* 05/28/2014   CHOLHDL 5 05/28/2014   7. Hyperglycemia Low carb diet and exercise.   - Hemoglobin A1c; Future  8. GYN.  Has seen Dr Ouida Sills.   Evaluated in 02/2014.  States everything checked out fine.  Mammogram 05/23/14 - Birads I.   HEALTH MAINTENANCE.  Pelvic/pap through gyn.  Declines colonoscopy.  I have discussed the need for colonoscopy.  Stressed the importance of early screening.  She will notify me when she is agreeable.  Mammogram 05/23/14 - Birads I.

## 2014-06-02 ENCOUNTER — Encounter: Payer: Self-pay | Admitting: Internal Medicine

## 2014-06-02 DIAGNOSIS — Z6832 Body mass index (BMI) 32.0-32.9, adult: Secondary | ICD-10-CM | POA: Insufficient documentation

## 2014-06-27 ENCOUNTER — Other Ambulatory Visit: Payer: Self-pay | Admitting: Internal Medicine

## 2014-08-08 ENCOUNTER — Other Ambulatory Visit: Payer: Self-pay | Admitting: Internal Medicine

## 2014-09-06 ENCOUNTER — Ambulatory Visit (INDEPENDENT_AMBULATORY_CARE_PROVIDER_SITE_OTHER): Payer: Medicare Other | Admitting: Internal Medicine

## 2014-09-06 ENCOUNTER — Encounter: Payer: Self-pay | Admitting: Internal Medicine

## 2014-09-06 VITALS — BP 118/72 | HR 71 | Temp 99.0°F | Resp 14 | Ht 63.5 in | Wt 183.8 lb

## 2014-09-06 DIAGNOSIS — N3000 Acute cystitis without hematuria: Secondary | ICD-10-CM | POA: Diagnosis not present

## 2014-09-06 DIAGNOSIS — R350 Frequency of micturition: Secondary | ICD-10-CM | POA: Diagnosis not present

## 2014-09-06 LAB — POCT URINALYSIS DIPSTICK
Bilirubin, UA: NEGATIVE
Glucose, UA: NEGATIVE
Ketones, UA: NEGATIVE
Nitrite, UA: POSITIVE
Spec Grav, UA: 1.02
UROBILINOGEN UA: 0.2
pH, UA: 6.5

## 2014-09-06 MED ORDER — CIPROFLOXACIN HCL 250 MG PO TABS: 250.0000 mg | ORAL_TABLET | Freq: Two times a day (BID) | ORAL | Status: DC

## 2014-09-06 NOTE — Progress Notes (Signed)
Pre visit review using our clinic review tool, if applicable. No additional management support is needed unless otherwise documented below in the visit note. 

## 2014-09-07 ENCOUNTER — Encounter: Payer: Self-pay | Admitting: Internal Medicine

## 2014-09-07 NOTE — Progress Notes (Signed)
Patient ID: Lindsey Harmon, female   DOB: 1946/05/16, 69 y.o.   MRN: [id number]   Subjective:    Patient ID: Lindsey Harmon, female    DOB: May 11, 1946, 69 y.o.   MRN: [id number]  HPI  Patient here as a work in with concerns regarding a possible urinary tract infection.  States symptoms started two night ago.  Nocturia.  Suprapubic pressure.  Dark urine.  No blood.  No dysuria.  Feels similar to her previous uti symptoms.  No vomiting.  No diarrhea.  Some decreased appetite this am.  No fever.     Past Medical History  Diagnosis Date  . Hypertension   . Pure hypercholesterolemia   . GERD (gastroesophageal reflux disease)   . Urinary retention   . Fibrocystic breast disease   . Abnormal liver function     Current Outpatient Prescriptions on File Prior to Visit  Medication Sig Dispense Refill  . bisoprolol (ZEBETA) 5 MG tablet TAKE 1 TABLET BY MOUTH DAILY. 90 tablet 3  . Calcium Carbonate-Vitamin D (CALTRATE 600+D PO) Take by mouth.    . hydrochlorothiazide (HYDRODIURIL) 25 MG tablet TAKE 1 TABLET (25 MG TOTAL) BY MOUTH DAILY. (Patient taking differently: TAKE 1/2 TABLET (12.5 MG TOTAL) BY MOUTH DAILY.) 90 tablet 1  . losartan (COZAAR) 100 MG tablet TAKE 1 TABLET (100 MG TOTAL) BY MOUTH DAILY. 90 tablet 1  . nitrofurantoin, macrocrystal-monohydrate, (MACROBID) 100 MG capsule Take 100 mg by mouth daily.    . Omega-3 Fatty Acids (FISH OIL) 1200 MG CAPS Take by mouth daily.    Marland Kitchen omeprazole (PRILOSEC) 20 MG capsule TAKE 1 CAPSULE (20 MG TOTAL) BY MOUTH 2 (TWO) TIMES DAILY. 180 capsule 1  . sertraline (ZOLOFT) 50 MG tablet TAKE 1 1/2 TAB EVERY DAY 135 tablet 0  . simvastatin (ZOCOR) 20 MG tablet TAKE 1 TABLET (20 MG TOTAL) BY MOUTH DAILY. 90 tablet 1   No current facility-administered medications on file prior to visit.    Review of Systems  Constitutional: Negative for fever and chills.  Gastrointestinal: Negative for nausea, vomiting, abdominal pain and diarrhea.       Reports suprapubic  pressure.    Genitourinary: Negative for dysuria and vaginal discharge.       Nocturia.  No hematuria.  No dysuria.  Increased frequency.  Feels similar to previous infections.    Musculoskeletal: Negative for back pain.       Objective:    Physical Exam  Constitutional: No distress.  Cardiovascular: Normal rate and regular rhythm.   Pulmonary/Chest: Effort normal and breath sounds normal. No respiratory distress.  Abdominal: Soft. Bowel sounds are normal. There is no tenderness.  Musculoskeletal:  No CVA tenderness.     BP 118/72 mmHg  Pulse 71  Temp(Src) 99 F (37.2 C) (Oral)  Resp 14  Ht 5' 3.5" (1.613 m)  Wt 183 lb 12.8 oz (83.371 kg)  BMI 32.04 kg/m2  SpO2 97% Wt Readings from Last 3 Encounters:  09/06/14 183 lb 12.8 oz (83.371 kg)  05/30/14 183 lb (83.008 kg)  02/26/14 184 lb 8 oz (83.689 kg)     Lab Results  Component Value Date   WBC 8.3 05/28/2014   HGB 13.6 05/28/2014   HCT 41.4 05/28/2014   PLT 239.0 05/28/2014   GLUCOSE 106* 05/28/2014   CHOL 187 05/28/2014   TRIG 227.0* 05/28/2014   HDL 38.90* 05/28/2014   LDLDIRECT 102.3 05/28/2014   LDLCALC 87 01/30/2013   ALT 33 05/28/2014   AST  30 05/28/2014   NA 135 05/28/2014   K 3.7 05/28/2014   CL 100 05/28/2014   CREATININE 1.0 05/28/2014   BUN 17 05/28/2014   CO2 30 05/28/2014   TSH 3.79 05/28/2014   HGBA1C 6.4 05/29/2014       Assessment & Plan:   Problem List Items Addressed This Visit    UTI (urinary tract infection)    Urinary symptoms c/w her previous uti's.  On macrobid for suppression.  Urine dip c/w uti.  Treat with cipro as directed.  Fluids.  Send culture.  Follow.         Other Visit Diagnoses    Urinary frequency    -  Primary    Relevant Orders    POCT Urinalysis Dipstick (Completed)    Urine culture        Einar Pheasant, MD

## 2014-09-07 NOTE — Assessment & Plan Note (Signed)
Urinary symptoms c/w her previous uti's.  On macrobid for suppression.  Urine dip c/w uti.  Treat with cipro as directed.  Fluids.  Send culture.  Follow.

## 2014-09-08 ENCOUNTER — Other Ambulatory Visit: Payer: Self-pay | Admitting: Internal Medicine

## 2014-09-09 LAB — URINE CULTURE

## 2014-09-10 ENCOUNTER — Encounter: Payer: Self-pay | Admitting: Internal Medicine

## 2014-09-14 ENCOUNTER — Other Ambulatory Visit: Payer: Self-pay | Admitting: Internal Medicine

## 2014-10-01 ENCOUNTER — Encounter: Payer: Self-pay | Admitting: Internal Medicine

## 2014-10-01 ENCOUNTER — Other Ambulatory Visit (INDEPENDENT_AMBULATORY_CARE_PROVIDER_SITE_OTHER): Payer: Medicare Other

## 2014-10-01 DIAGNOSIS — I10 Essential (primary) hypertension: Secondary | ICD-10-CM

## 2014-10-01 DIAGNOSIS — R739 Hyperglycemia, unspecified: Secondary | ICD-10-CM

## 2014-10-01 DIAGNOSIS — E7800 Pure hypercholesterolemia, unspecified: Secondary | ICD-10-CM

## 2014-10-01 LAB — BASIC METABOLIC PANEL
BUN: 17 mg/dL (ref 6–23)
CALCIUM: 9.8 mg/dL (ref 8.4–10.5)
CO2: 30 mEq/L (ref 19–32)
Chloride: 102 mEq/L (ref 96–112)
Creatinine, Ser: 0.97 mg/dL (ref 0.40–1.20)
GFR: 60.11 mL/min (ref >60.00)
GLUCOSE: 99 mg/dL (ref 70–99)
POTASSIUM: 3.8 meq/L (ref 3.5–5.1)
SODIUM: 138 meq/L (ref 135–145)

## 2014-10-01 LAB — LIPID PANEL
Cholesterol: 167 mg/dL (ref 0–200)
HDL: 40.8 mg/dL (ref >39.00)
LDL CALC: 86 mg/dL (ref 0–99)
NONHDL: 126.2
Total CHOL/HDL Ratio: 4
Triglycerides: 199 mg/dL — ABNORMAL HIGH (ref 0.0–149.0)
VLDL: 39.8 mg/dL (ref 0.0–40.0)

## 2014-10-01 LAB — HEPATIC FUNCTION PANEL
ALBUMIN: 4.1 g/dL (ref 3.5–5.2)
ALK PHOS: 68 U/L (ref 39–117)
ALT: 25 U/L (ref 0–35)
AST: 27 U/L (ref 0–37)
Bilirubin, Direct: 0.1 mg/dL (ref 0.0–0.3)
TOTAL PROTEIN: 6.9 g/dL (ref 6.0–8.3)
Total Bilirubin: 0.7 mg/dL (ref 0.2–1.2)

## 2014-10-01 LAB — HEMOGLOBIN A1C: Hgb A1c MFr Bld: 6.2 % (ref 4.6–6.5)

## 2014-10-03 ENCOUNTER — Encounter: Payer: Self-pay | Admitting: Internal Medicine

## 2014-10-03 ENCOUNTER — Ambulatory Visit (INDEPENDENT_AMBULATORY_CARE_PROVIDER_SITE_OTHER): Payer: Medicare Other | Admitting: Internal Medicine

## 2014-10-03 VITALS — BP 136/74 | HR 57 | Temp 98.4°F | Ht 63.5 in | Wt 182.5 lb

## 2014-10-03 DIAGNOSIS — R21 Rash and other nonspecific skin eruption: Secondary | ICD-10-CM

## 2014-10-03 DIAGNOSIS — E7800 Pure hypercholesterolemia, unspecified: Secondary | ICD-10-CM

## 2014-10-03 DIAGNOSIS — R945 Abnormal results of liver function studies: Secondary | ICD-10-CM

## 2014-10-03 DIAGNOSIS — R739 Hyperglycemia, unspecified: Secondary | ICD-10-CM

## 2014-10-03 DIAGNOSIS — I10 Essential (primary) hypertension: Secondary | ICD-10-CM

## 2014-10-03 DIAGNOSIS — F42 Obsessive-compulsive disorder: Secondary | ICD-10-CM

## 2014-10-03 DIAGNOSIS — Z0000 Encounter for general adult medical examination without abnormal findings: Secondary | ICD-10-CM

## 2014-10-03 DIAGNOSIS — L290 Pruritus ani: Secondary | ICD-10-CM

## 2014-10-03 DIAGNOSIS — R001 Bradycardia, unspecified: Secondary | ICD-10-CM

## 2014-10-03 DIAGNOSIS — E669 Obesity, unspecified: Secondary | ICD-10-CM

## 2014-10-03 DIAGNOSIS — K7689 Other specified diseases of liver: Secondary | ICD-10-CM | POA: Diagnosis not present

## 2014-10-03 DIAGNOSIS — F429 Obsessive-compulsive disorder, unspecified: Secondary | ICD-10-CM

## 2014-10-03 DIAGNOSIS — N289 Disorder of kidney and ureter, unspecified: Secondary | ICD-10-CM

## 2014-10-03 MED ORDER — NYSTATIN 100000 UNIT/GM EX CREA: 1.0000 "application " | TOPICAL_CREAM | Freq: Two times a day (BID) | CUTANEOUS | Status: DC

## 2014-10-03 NOTE — Progress Notes (Signed)
Subjective:  Patient ID: Lindsey Harmon, female    DOB: 09/30/1945  Age: 69 y.o. MRN: [id number]  CC: The primary encounter diagnosis was Rash. Diagnoses of Essential hypertension, benign, Abnormal liver function, Hypercholesterolemia, Obsessive compulsive disorder, Renal insufficiency, Hyperglycemia, Bradycardia, Obesity (BMI 30-39.9), Rectal itching, and Health care maintenance were also pertinent to this visit.  HPI Lindsey Harmon presents for a scheduled follow up.  Recent labs revealed normal liver function tests and kidney function tests.  Eating and drinking well.  Bowels stable.  Does have rash - "bottom".  Itches and burns.  No vaginal or urinary symptoms.  Breathing stable.    Past Medical History  Diagnosis Date  . Hypertension   . Pure hypercholesterolemia   . GERD (gastroesophageal reflux disease)   . Urinary retention   . Fibrocystic breast disease   . Abnormal liver function     Outpatient Prescriptions Prior to Visit  Medication Sig Dispense Refill  . bisoprolol (ZEBETA) 5 MG tablet TAKE 1 TABLET BY MOUTH DAILY. 90 tablet 3  . Calcium Carbonate-Vitamin D (CALTRATE 600+D PO) Take by mouth.    . hydrochlorothiazide (HYDRODIURIL) 25 MG tablet TAKE 1 TABLET BY MOUTH DAILY (Patient taking differently: TAKE 1/2 TABLET BY MOUTH DAILY) 90 tablet 1  . losartan (COZAAR) 100 MG tablet TAKE 1 TABLET (100 MG TOTAL) BY MOUTH DAILY. 90 tablet 1  . Omega-3 Fatty Acids (FISH OIL) 1200 MG CAPS Take by mouth daily.    Marland Kitchen omeprazole (PRILOSEC) 20 MG capsule TAKE 1 CAPSULE (20 MG TOTAL) BY MOUTH 2 (TWO) TIMES DAILY. 180 capsule 1  . sertraline (ZOLOFT) 50 MG tablet TAKE 1 AND 1/2 TABLETS EVERY DAY 135 tablet 0  . simvastatin (ZOCOR) 20 MG tablet TAKE 1 TABLET (20 MG TOTAL) BY MOUTH DAILY. 90 tablet 1  . ciprofloxacin (CIPRO) 250 MG tablet Take 1 tablet (250 mg total) by mouth 2 (two) times daily. 10 tablet 0  . hydrochlorothiazide (HYDRODIURIL) 25 MG tablet TAKE 1 TABLET BY MOUTH DAILY 90  tablet 1  . losartan (COZAAR) 100 MG tablet TAKE 1 TABLET (100 MG TOTAL) BY MOUTH DAILY. 90 tablet 1  . nitrofurantoin, macrocrystal-monohydrate, (MACROBID) 100 MG capsule Take 100 mg by mouth daily.    Marland Kitchen omeprazole (PRILOSEC) 20 MG capsule TAKE 1 CAPSULE (20 MG TOTAL) BY MOUTH 2 (TWO) TIMES DAILY. 180 capsule 1  . simvastatin (ZOCOR) 20 MG tablet TAKE 1 TABLET (20 MG TOTAL) BY MOUTH DAILY. 90 tablet 1   No facility-administered medications prior to visit.    ROS Review of Systems  Constitutional: Negative for appetite change and unexpected weight change.  HENT: Negative for congestion and sinus pressure.   Respiratory: Negative for cough, chest tightness and shortness of breath.   Cardiovascular: Negative for chest pain, palpitations and leg swelling.  Gastrointestinal: Negative for nausea, vomiting, abdominal pain and diarrhea.  Genitourinary: Negative for dysuria and difficulty urinating.  Skin: Positive for rash (rash - peri rectal region.  ithcing. ). Negative for wound.  Neurological: Negative for dizziness, light-headedness and headaches.  Psychiatric/Behavioral: Negative for dysphoric mood and agitation.       On zoloft for her OCD.  Feels things are stable.     Objective:  BP 136/74 mmHg  Pulse 57  Temp(Src) 98.4 F (36.9 C) (Oral)  Ht 5' 3.5" (1.613 m)  Wt 182 lb 8 oz (82.781 kg)  BMI 31.82 kg/m2  SpO2 94%  BP Readings from Last 3 Encounters:  10/03/14 136/74  09/06/14  118/72  05/30/14 130/80    Wt Readings from Last 3 Encounters:  10/03/14 182 lb 8 oz (82.781 kg)  09/06/14 183 lb 12.8 oz (83.371 kg)  05/30/14 183 lb (83.008 kg)    Blood pressure recheck:  136/74  Physical Exam  Constitutional: She appears well-developed and well-nourished. No distress.  HENT:  Nose: Nose normal.  Mouth/Throat: Oropharynx is clear and moist.  Neck: Neck supple. No thyromegaly present.  Cardiovascular: Normal rate and regular rhythm.   Pulmonary/Chest: Breath sounds  normal. No respiratory distress. She has no wheezes.  Abdominal: Soft. Bowel sounds are normal. There is no tenderness.  Musculoskeletal: She exhibits no edema or tenderness.  Lymphadenopathy:    She has no cervical adenopathy.  Skin: Rash (increased redness - peri rectal region.  appears to be c/w yeast.) noted.  Psychiatric: She has a normal mood and affect. Her behavior is normal.    Lab Results  Component Value Date   HGBA1C 6.2 10/01/2014   HGBA1C 6.4 05/29/2014   HGBA1C 5.9 08/21/2012    Lab Results  Component Value Date   CREATININE 0.97 10/01/2014   CREATININE 1.0 05/28/2014   CREATININE 1.0 01/29/2014    Lab Results  Component Value Date   WBC 8.3 05/28/2014   HGB 13.6 05/28/2014   HCT 41.4 05/28/2014   PLT 239.0 05/28/2014   GLUCOSE 99 10/01/2014   CHOL 167 10/01/2014   TRIG 199.0* 10/01/2014   HDL 40.80 10/01/2014   LDLDIRECT 102.3 05/28/2014   LDLCALC 86 10/01/2014   ALT 25 10/01/2014   AST 27 10/01/2014   NA 138 10/01/2014   K 3.8 10/01/2014   CL 102 10/01/2014   CREATININE 0.97 10/01/2014   BUN 17 10/01/2014   CO2 30 10/01/2014   TSH 3.79 05/28/2014   HGBA1C 6.2 10/01/2014    Assessment & Plan:   Problem List Items Addressed This Visit    Abnormal liver function    Recent liver panel wnl.  Was worked up by GI.  Felt to be due to fatty liver.       Bradycardia    Pulse rate better.  Blood pressure doing well.       Essential hypertension, benign    Blood pressure doing well.  Same medication regimen.  Follow pressures.  Follow metabolic panel.        Relevant Orders   Basic metabolic panel   Health care maintenance    Breast and gyn exams through GYN.  Sees Dr Ouida Sills.  Due f/u in fall 2016.  Mammogram 05/23/14 - Birads I.  Declines colonoscopy.       Hypercholesterolemia    Cholesterol 10/01/14 - wnl.  LDL 86.  On simvastatin.  Low cholesterol diet and exercise.   Lab Results  Component Value Date   CHOL 167 10/01/2014   HDL  40.80 10/01/2014   LDLCALC 86 10/01/2014   LDLDIRECT 102.3 05/28/2014   TRIG 199.0* 10/01/2014   CHOLHDL 4 10/01/2014        Relevant Orders   Hepatic function panel   Lipid panel   Hyperglycemia    Low carb diet and exercise.  Follow met b and a1c.       Relevant Orders   Hemoglobin A1c   Obesity (BMI 30-39.9)    Diet and exercise.  Follow.       Obsessive compulsive disorder    On zoloft.  Stable.        Rectal itching    Treat with nystatin  cream as directed.        Renal insufficiency    Cr just checked 10/01/14 - .97.  Follow.        Other Visit Diagnoses    Rash    -  Primary    treat with nystatin cream bid.  appears to be c/w yeast - peri-rectal region.        I have discontinued Ms. Such's nitrofurantoin (macrocrystal-monohydrate) and ciprofloxacin. I am also having her start on nystatin cream. Additionally, I am having her maintain her Fish Oil, Calcium Carbonate-Vitamin D (CALTRATE 600+D PO), bisoprolol, sertraline, hydrochlorothiazide, losartan, omeprazole, simvastatin, and sulfamethoxazole-trimethoprim.  I spent 25 minutes with the patient and more than 50% of the time was spent in consultation regarding the above.     Follow-up: Return in about 4 months (around 02/03/2015) for follow up appt (52mn).   SEinar Pheasant MD

## 2014-10-03 NOTE — Progress Notes (Signed)
Pre visit review using our clinic review tool, if applicable. No additional management support is needed unless otherwise documented below in the visit note. 

## 2014-10-14 ENCOUNTER — Encounter: Payer: Self-pay | Admitting: Internal Medicine

## 2014-10-14 DIAGNOSIS — Z0000 Encounter for general adult medical examination without abnormal findings: Secondary | ICD-10-CM | POA: Insufficient documentation

## 2014-10-14 NOTE — Assessment & Plan Note (Signed)
Blood pressure doing well.  Same medication regimen.  Follow pressures.  Follow metabolic panel.   

## 2014-10-14 NOTE — Assessment & Plan Note (Signed)
Recent liver panel wnl.  Was worked up by GI.  Felt to be due to fatty liver.

## 2014-10-14 NOTE — Assessment & Plan Note (Signed)
Pulse rate better.  Blood pressure doing well.

## 2014-10-14 NOTE — Assessment & Plan Note (Signed)
Low carb diet and exercise.  Follow met b and a1c.  

## 2014-10-14 NOTE — Assessment & Plan Note (Signed)
Breast and gyn exams through GYN.  Sees Dr Ouida Sills.  Due f/u in fall 2016.  Mammogram 05/23/14 - Birads I.  Declines colonoscopy.

## 2014-10-14 NOTE — Assessment & Plan Note (Signed)
Cr just checked 10/01/14 - .97.  Follow.

## 2014-10-14 NOTE — Assessment & Plan Note (Signed)
On zoloft.  Stable.  

## 2014-10-14 NOTE — Assessment & Plan Note (Signed)
Diet and exercise.  Follow.  

## 2014-10-14 NOTE — Assessment & Plan Note (Signed)
Treat with nystatin cream as directed.

## 2014-10-14 NOTE — Assessment & Plan Note (Signed)
Cholesterol 10/01/14 - wnl.  LDL 86.  On simvastatin.  Low cholesterol diet and exercise.   Lab Results  Component Value Date   CHOL 167 10/01/2014   HDL 40.80 10/01/2014   LDLCALC 86 10/01/2014   LDLDIRECT 102.3 05/28/2014   TRIG 199.0* 10/01/2014   CHOLHDL 4 10/01/2014

## 2014-10-19 ENCOUNTER — Other Ambulatory Visit: Payer: Self-pay | Admitting: Internal Medicine

## 2014-11-23 ENCOUNTER — Other Ambulatory Visit: Payer: Self-pay | Admitting: Internal Medicine

## 2015-01-31 ENCOUNTER — Other Ambulatory Visit (INDEPENDENT_AMBULATORY_CARE_PROVIDER_SITE_OTHER): Payer: Medicare Other

## 2015-01-31 DIAGNOSIS — R739 Hyperglycemia, unspecified: Secondary | ICD-10-CM

## 2015-01-31 DIAGNOSIS — I10 Essential (primary) hypertension: Secondary | ICD-10-CM

## 2015-01-31 DIAGNOSIS — E7800 Pure hypercholesterolemia, unspecified: Secondary | ICD-10-CM

## 2015-01-31 LAB — LIPID PANEL
CHOLESTEROL: 162 mg/dL (ref 0–200)
HDL: 41.7 mg/dL (ref >39.00)
LDL CALC: 84 mg/dL (ref 0–99)
NONHDL: 120.14
Total CHOL/HDL Ratio: 4
Triglycerides: 179 mg/dL — ABNORMAL HIGH (ref 0.0–149.0)
VLDL: 35.8 mg/dL (ref 0.0–40.0)

## 2015-01-31 LAB — HEMOGLOBIN A1C: HEMOGLOBIN A1C: 6 % (ref 4.6–6.5)

## 2015-01-31 LAB — BASIC METABOLIC PANEL
BUN: 20 mg/dL (ref 6–23)
CALCIUM: 9.6 mg/dL (ref 8.4–10.5)
CO2: 30 mEq/L (ref 19–32)
CREATININE: 1.18 mg/dL (ref 0.40–1.20)
Chloride: 101 mEq/L (ref 96–112)
GFR: 47.9 mL/min — ABNORMAL LOW (ref >60.00)
Glucose, Bld: 99 mg/dL (ref 70–99)
Potassium: 4 mEq/L (ref 3.5–5.1)
Sodium: 138 mEq/L (ref 135–145)

## 2015-01-31 LAB — HEPATIC FUNCTION PANEL
ALBUMIN: 4.3 g/dL (ref 3.5–5.2)
ALT: 28 U/L (ref 0–35)
AST: 25 U/L (ref 0–37)
Alkaline Phosphatase: 63 U/L (ref 39–117)
BILIRUBIN DIRECT: 0.1 mg/dL (ref 0.0–0.3)
Total Bilirubin: 0.6 mg/dL (ref 0.2–1.2)
Total Protein: 7.1 g/dL (ref 6.0–8.3)

## 2015-02-02 ENCOUNTER — Encounter: Payer: Self-pay | Admitting: Internal Medicine

## 2015-02-04 ENCOUNTER — Ambulatory Visit (INDEPENDENT_AMBULATORY_CARE_PROVIDER_SITE_OTHER): Payer: Medicare Other | Admitting: Internal Medicine

## 2015-02-04 ENCOUNTER — Encounter: Payer: Self-pay | Admitting: Internal Medicine

## 2015-02-04 VITALS — BP 100/80 | HR 55 | Temp 98.2°F | Ht 63.5 in | Wt 182.5 lb

## 2015-02-04 DIAGNOSIS — E669 Obesity, unspecified: Secondary | ICD-10-CM

## 2015-02-04 DIAGNOSIS — R748 Abnormal levels of other serum enzymes: Secondary | ICD-10-CM

## 2015-02-04 DIAGNOSIS — K7689 Other specified diseases of liver: Secondary | ICD-10-CM

## 2015-02-04 DIAGNOSIS — I10 Essential (primary) hypertension: Secondary | ICD-10-CM | POA: Diagnosis not present

## 2015-02-04 DIAGNOSIS — F42 Obsessive-compulsive disorder: Secondary | ICD-10-CM

## 2015-02-04 DIAGNOSIS — R7989 Other specified abnormal findings of blood chemistry: Secondary | ICD-10-CM

## 2015-02-04 DIAGNOSIS — N183 Chronic kidney disease, stage 3 unspecified: Secondary | ICD-10-CM | POA: Insufficient documentation

## 2015-02-04 DIAGNOSIS — E7800 Pure hypercholesterolemia, unspecified: Secondary | ICD-10-CM

## 2015-02-04 DIAGNOSIS — R945 Abnormal results of liver function studies: Secondary | ICD-10-CM

## 2015-02-04 DIAGNOSIS — F429 Obsessive-compulsive disorder, unspecified: Secondary | ICD-10-CM

## 2015-02-04 DIAGNOSIS — R739 Hyperglycemia, unspecified: Secondary | ICD-10-CM

## 2015-02-04 NOTE — Assessment & Plan Note (Signed)
Diet and exercise.   

## 2015-02-04 NOTE — Assessment & Plan Note (Signed)
On zoloft.  Doing better.  Does not feel needs any further intervention.

## 2015-02-04 NOTE — Assessment & Plan Note (Signed)
On simvastatin.  Low cholesterol diet and exercise.  Follow lipid panel and liver function tests.   Lab Results  Component Value Date   CHOL 162 01/31/2015   HDL 41.70 01/31/2015   LDLCALC 84 01/31/2015   LDLDIRECT 102.3 05/28/2014   TRIG 179.0* 01/31/2015   CHOLHDL 4 01/31/2015

## 2015-02-04 NOTE — Assessment & Plan Note (Signed)
Low carb diet and exercise.  Follow met b and a1c.  a1c just checked 6.0.

## 2015-02-04 NOTE — Progress Notes (Signed)
Patient ID: Lindsey Harmon, female   DOB: 09-29-1945, 69 y.o.   MRN: [id number]   Subjective:    Patient ID: Lindsey Harmon, female    DOB: 1945/08/19, 69 y.o.   MRN: [id number]  HPI  Patient with past history of hypertension, abnormal liver function tests and hypercholesterolemia.  Here today to follow up on these issues as well as f/u for OCD.  She is taking sertraline.  Doing better on this medication.  Does not feel she needs any further intervention.  Stays active.  No cardiac symptoms with increased activity or exertion.  No acid reflux reported.  No abdominal pain or cramping.  Bowels stable.  States blood pressure is averaging 115-120/70.     Past Medical History  Diagnosis Date  . Hypertension   . Pure hypercholesterolemia   . GERD (gastroesophageal reflux disease)   . Urinary retention   . Fibrocystic breast disease   . Abnormal liver function    Past Surgical History  Procedure Laterality Date  . Appendectomy    . Abdominal hysterectomy    . Bladder tack     Family History  Problem Relation Age of Onset  . Pneumonia Mother     died of presumed aspiration pneumonia  . Hypertension Mother   . Cancer Father     prostate  . Heart disease Paternal Grandfather     myocardial infarction   Social History   Social History  . Marital Status: Married    Spouse Name: N/A  . Number of Children: N/A  . Years of Education: N/A   Social History Main Topics  . Smoking status: Never Smoker   . Smokeless tobacco: Never Used  . Alcohol Use: No  . Drug Use: No  . Sexual Activity: Not Asked   Other Topics Concern  . None   Social History Narrative     Review of Systems  Constitutional: Negative for appetite change and unexpected weight change.  HENT: Negative for congestion and sore throat.   Respiratory: Negative for cough, chest tightness and shortness of breath.   Cardiovascular: Negative for chest pain, palpitations and leg swelling.  Gastrointestinal: Negative for  nausea, vomiting, abdominal pain and diarrhea.  Musculoskeletal: Negative for back pain and joint swelling.  Skin: Negative for color change and rash.  Neurological: Negative for dizziness, light-headedness and headaches.  Psychiatric/Behavioral: Negative for dysphoric mood and agitation.       Doing better on zoloft.        Objective:     Blood pressure rechecked by me:  118/76  Physical Exam  Constitutional: She appears well-developed and well-nourished. No distress.  HENT:  Nose: Nose normal.  Mouth/Throat: Oropharynx is clear and moist.  Neck: Neck supple. No thyromegaly present.  Cardiovascular: Normal rate and regular rhythm.   Pulmonary/Chest: Breath sounds normal. No respiratory distress. She has no wheezes.  Abdominal: Soft. Bowel sounds are normal. There is no tenderness.  Musculoskeletal: She exhibits no edema or tenderness.  Lymphadenopathy:    She has no cervical adenopathy.  Skin: No rash noted. No erythema.  Psychiatric: She has a normal mood and affect. Her behavior is normal.    BP 100/80 mmHg  Pulse 55  Temp(Src) 98.2 F (36.8 C) (Oral)  Ht 5' 3.5" (1.613 m)  Wt 182 lb 8 oz (82.781 kg)  BMI 31.82 kg/m2  SpO2 97% Wt Readings from Last 3 Encounters:  02/04/15 182 lb 8 oz (82.781 kg)  10/03/14 182 lb 8 oz (82.781 kg)  09/06/14 183 lb 12.8 oz (83.371 kg)     Lab Results  Component Value Date   WBC 8.3 05/28/2014   HGB 13.6 05/28/2014   HCT 41.4 05/28/2014   PLT 239.0 05/28/2014   GLUCOSE 99 01/31/2015   CHOL 162 01/31/2015   TRIG 179.0* 01/31/2015   HDL 41.70 01/31/2015   LDLDIRECT 102.3 05/28/2014   LDLCALC 84 01/31/2015   ALT 28 01/31/2015   AST 25 01/31/2015   NA 138 01/31/2015   K 4.0 01/31/2015   CL 101 01/31/2015   CREATININE 1.18 01/31/2015   BUN 20 01/31/2015   CO2 30 01/31/2015   TSH 3.79 05/28/2014   HGBA1C 6.0 01/31/2015       Assessment & Plan:   Problem List Items Addressed This Visit    Abnormal liver function     Worked up by GI.  Felt to be due to fatty liver.  Recent liver panel wnl.        Elevated serum creatinine - Primary    Stay hydrated.  Cr just checked 1.18.  Recheck metabolic panel in a few weeks.        Relevant Orders   Basic metabolic panel   Essential hypertension, benign    Blood pressure under good control.  Continue same medication regimen.  Follow pressures.  Follow metabolic panel.        Hypercholesterolemia    On simvastatin.  Low cholesterol diet and exercise.  Follow lipid panel and liver function tests.   Lab Results  Component Value Date   CHOL 162 01/31/2015   HDL 41.70 01/31/2015   LDLCALC 84 01/31/2015   LDLDIRECT 102.3 05/28/2014   TRIG 179.0* 01/31/2015   CHOLHDL 4 01/31/2015        Hyperglycemia    Low carb diet and exercise.  Follow met b and a1c.  a1c just checked 6.0.        Obesity (BMI 30-39.9)    Diet and exercise.        Obsessive compulsive disorder    On zoloft.  Doing better.  Does not feel needs any further intervention.           Einar Pheasant, MD

## 2015-02-04 NOTE — Progress Notes (Signed)
Pre-visit discussion using our clinic review tool. No additional management support is needed unless otherwise documented below in the visit note.  

## 2015-02-04 NOTE — Assessment & Plan Note (Signed)
Worked up by GI.  Felt to be due to fatty liver.  Recent liver panel wnl.

## 2015-02-04 NOTE — Assessment & Plan Note (Signed)
Stay hydrated.  Cr just checked 1.18.  Recheck metabolic panel in a few weeks.

## 2015-02-04 NOTE — Assessment & Plan Note (Signed)
Blood pressure under good control.  Continue same medication regimen.  Follow pressures.  Follow metabolic panel.   

## 2015-02-25 ENCOUNTER — Other Ambulatory Visit (INDEPENDENT_AMBULATORY_CARE_PROVIDER_SITE_OTHER): Payer: Medicare Other

## 2015-02-25 DIAGNOSIS — R748 Abnormal levels of other serum enzymes: Secondary | ICD-10-CM

## 2015-02-25 DIAGNOSIS — R7989 Other specified abnormal findings of blood chemistry: Secondary | ICD-10-CM

## 2015-02-25 LAB — BASIC METABOLIC PANEL
BUN: 19 mg/dL (ref 6–23)
CHLORIDE: 103 meq/L (ref 96–112)
CO2: 24 mEq/L (ref 19–32)
CREATININE: 1.15 mg/dL (ref 0.40–1.20)
Calcium: 9.8 mg/dL (ref 8.4–10.5)
GFR: 49.33 mL/min — AB (ref >60.00)
Glucose, Bld: 94 mg/dL (ref 70–99)
Potassium: 4 mEq/L (ref 3.5–5.1)
Sodium: 140 mEq/L (ref 135–145)

## 2015-02-26 ENCOUNTER — Other Ambulatory Visit: Payer: Self-pay | Admitting: Internal Medicine

## 2015-02-26 DIAGNOSIS — N289 Disorder of kidney and ureter, unspecified: Secondary | ICD-10-CM

## 2015-02-26 NOTE — Progress Notes (Signed)
Order placed for f/u met b.  

## 2015-02-27 ENCOUNTER — Encounter: Payer: Self-pay | Admitting: *Deleted

## 2015-03-11 ENCOUNTER — Telehealth: Payer: Self-pay | Admitting: *Deleted

## 2015-03-11 ENCOUNTER — Other Ambulatory Visit: Payer: Self-pay | Admitting: Obstetrics and Gynecology

## 2015-03-11 DIAGNOSIS — Z1231 Encounter for screening mammogram for malignant neoplasm of breast: Secondary | ICD-10-CM

## 2015-03-11 NOTE — Telephone Encounter (Signed)
I need the urine culture results to see what other options are available.  I thought they had her own suppressive bactrim.   Need asap.

## 2015-03-11 NOTE — Telephone Encounter (Signed)
[name] with [facility] called to notify you that Lindsey Harmon needs to be treated for an infection. They wanted to start her on Bactrim DS, but received a notification of high alert due to patient taking Losartan (increase potassium level, could be fatal). Please advise if necessary to change abx to something different.

## 2015-03-12 NOTE — Telephone Encounter (Signed)
Tried to contact OBGYN-faxed record request this morning

## 2015-03-12 NOTE — Telephone Encounter (Signed)
See fax placed in green folder

## 2015-03-13 NOTE — Telephone Encounter (Signed)
Information faxed back to [place] at [facility]

## 2015-03-13 NOTE — Telephone Encounter (Signed)
Notify [facility] that I reviewed their information.  She does not have infection.  It appears they have had her on bactrim for prophylaxis.  They are asking to change abx.  I do not have any abx allergies listed.  If she is not allergic to pcn or cephalosporins, then notify them that they could try keflex for prophylaxis.

## 2015-03-31 ENCOUNTER — Other Ambulatory Visit (INDEPENDENT_AMBULATORY_CARE_PROVIDER_SITE_OTHER): Payer: Medicare Other

## 2015-03-31 DIAGNOSIS — N289 Disorder of kidney and ureter, unspecified: Secondary | ICD-10-CM | POA: Diagnosis not present

## 2015-03-31 LAB — BASIC METABOLIC PANEL
BUN: 17 mg/dL (ref 6–23)
CALCIUM: 9.6 mg/dL (ref 8.4–10.5)
CO2: 30 mEq/L (ref 19–32)
CREATININE: 0.89 mg/dL (ref 0.40–1.20)
Chloride: 100 mEq/L (ref 96–112)
GFR: 66.29 mL/min (ref >60.00)
Glucose, Bld: 83 mg/dL (ref 70–99)
Potassium: 4.2 mEq/L (ref 3.5–5.1)
Sodium: 138 mEq/L (ref 135–145)

## 2015-04-10 ENCOUNTER — Other Ambulatory Visit: Payer: Self-pay | Admitting: Internal Medicine

## 2015-04-14 ENCOUNTER — Ambulatory Visit (INDEPENDENT_AMBULATORY_CARE_PROVIDER_SITE_OTHER): Payer: Medicare Other | Admitting: Family Medicine

## 2015-04-14 ENCOUNTER — Encounter: Payer: Self-pay | Admitting: Family Medicine

## 2015-04-14 VITALS — BP 132/78 | HR 67 | Temp 98.9°F | Ht 63.5 in

## 2015-04-14 DIAGNOSIS — J069 Acute upper respiratory infection, unspecified: Secondary | ICD-10-CM | POA: Insufficient documentation

## 2015-04-14 DIAGNOSIS — B9789 Other viral agents as the cause of diseases classified elsewhere: Principal | ICD-10-CM

## 2015-04-14 MED ORDER — FLUTICASONE PROPIONATE 50 MCG/ACT NA SUSP: 2.0000 | Freq: Every day | NASAL | Status: DC

## 2015-04-14 MED ORDER — BENZONATATE 200 MG PO CAPS: 200.0000 mg | ORAL_CAPSULE | Freq: Two times a day (BID) | ORAL | Status: DC | PRN

## 2015-04-14 MED ORDER — LORATADINE 10 MG PO TABS: 10.0000 mg | ORAL_TABLET | Freq: Every day | ORAL | Status: DC

## 2015-04-14 NOTE — Assessment & Plan Note (Signed)
Patient's symptoms most consistent with viral URI versus allergic rhinitis. Duration of symptoms and lack of sinus congestion and fever make sinusitis and pneumonia unlikely. She has normal lung exam as well making pneumonia unlikely. She is well-appearing. We will treat her with Claritin and Flonase. Tessalon for cough. She will continue to monitor. If not improved later this week she'll follow-up. She is given return precautions for sooner follow-up.

## 2015-04-14 NOTE — Patient Instructions (Signed)
Nice to meet you. Your symptoms are likely related to allergies or a viral illness. We will treat you with Claritin, Flonase, and Tessalon. Please monitor your symptoms. If you're not improving in next 5-7 days please follow-up with [place]. If you develop chest pain, shortness of breath, fevers, worsening headache, numbness, weakness, vision changes, blood in her sputum, or any new or change in symptoms please seek medical attention immediately.

## 2015-04-14 NOTE — Progress Notes (Signed)
Patient ID: Lindsey Harmon, female   DOB: Dec 07, 1945, 69 y.o.   MRN: [id number]  Tommi Rumps, MD Phone: [phone]  Lindsey Harmon is a 69 y.o. female who presents today for same-day visit.  Patient notes starting this past Thursday she developed postnasal drip and a cough. She notes sneezing as well. She has some clear material out of her nose. She notes her cough is nonproductive. She's not had any fevers, chest pain, or shortness of breath. She does note with a particularly heavy cough she gets a burning sensation, though this resolves after cough. This is infrequent. She's had minimal frontal headache that is dull and achy in nature. This is similar to her prior history of headaches. She denies numbness, weakness, and vision changes. She has been intermittently taking Tylenol for this. She notes her husband has similar symptoms.  PMH: nonsmoker.   ROS see history of present illness  Objective  Physical Exam Filed Vitals:   04/14/15 1059  BP: 132/78  Pulse: 67  Temp: 98.9 F (37.2 C)    Physical Exam  Constitutional: She is well-developed, well-nourished, and in no distress.  HENT:  Head: Normocephalic and atraumatic.  Right Ear: External ear normal.  Left Ear: External ear normal.  Mouth/Throat: Oropharynx is clear and moist. No oropharyngeal exudate.  Bilateral TMs normal  Eyes: Conjunctivae are normal. Pupils are equal, round, and reactive to light.  Neck: Neck supple.  Cardiovascular: Normal rate, regular rhythm and normal heart sounds.  Exam reveals no gallop and no friction rub.   No murmur heard. Pulmonary/Chest: Effort normal and breath sounds normal. No respiratory distress. She has no wheezes. She has no rales.  Lymphadenopathy:    She has no cervical adenopathy.  Neurological: She is alert.  CN 2-12 intact, 5/5 strength in bilateral biceps, triceps, grip, quads, hamstrings, plantar and dorsiflexion, sensation to light touch intact in bilateral UE and LE,  normal gait, 2+ patellar reflexes  Skin: Skin is warm and dry. She is not diaphoretic.     Assessment/Plan: Please see individual problem list.  Viral URI with cough Patient's symptoms most consistent with viral URI versus allergic rhinitis. Duration of symptoms and lack of sinus congestion and fever make sinusitis and pneumonia unlikely. She has normal lung exam as well making pneumonia unlikely. She is well-appearing. We will treat her with Claritin and Flonase. Tessalon for cough. She will continue to monitor. If not improved later this week she'll follow-up. She is given return precautions for sooner follow-up.    Tommi Rumps

## 2015-04-14 NOTE — Progress Notes (Signed)
Pre visit review using our clinic review tool, if applicable. No additional management support is needed unless otherwise documented below in the visit note. 

## 2015-05-07 ENCOUNTER — Other Ambulatory Visit: Payer: Self-pay | Admitting: Internal Medicine

## 2015-05-08 NOTE — Telephone Encounter (Signed)
Ok to refill Zoloft?  

## 2015-05-09 NOTE — Telephone Encounter (Signed)
ok'd zoloft #[id number] with no refills.

## 2015-05-22 ENCOUNTER — Telehealth: Payer: Self-pay | Admitting: Internal Medicine

## 2015-05-22 NOTE — Telephone Encounter (Signed)
Patient has cough can't cough anything up , but has to clear throat after coughing, the cough has persisted since before thanksgiving, afebrile denies chills or body aches, rescheduled patient with NP for 05/23/15 [facility]

## 2015-05-22 NOTE — Telephone Encounter (Signed)
Pt called about having Deep cough/wheezing/drainage. Pt does not want to see another provider. Pt has an appt on the 20th. Let me know where to sch pt if we can work her in. Thank You! [phone] cell

## 2015-05-23 ENCOUNTER — Ambulatory Visit (INDEPENDENT_AMBULATORY_CARE_PROVIDER_SITE_OTHER): Payer: Medicare Other | Admitting: Nurse Practitioner

## 2015-05-23 VITALS — BP 136/78 | HR 63 | Temp 99.0°F | Ht 64.0 in | Wt 183.0 lb

## 2015-05-23 DIAGNOSIS — B9789 Other viral agents as the cause of diseases classified elsewhere: Principal | ICD-10-CM

## 2015-05-23 DIAGNOSIS — J069 Acute upper respiratory infection, unspecified: Secondary | ICD-10-CM

## 2015-05-23 MED ORDER — AZITHROMYCIN 250 MG PO TABS: ORAL_TABLET | ORAL | Status: DC

## 2015-05-23 MED ORDER — HYDROCOD POLST-CPM POLST ER 10-8 MG/5ML PO SUER: 5.0000 mL | Freq: Every evening | ORAL | Status: DC | PRN

## 2015-05-23 MED ORDER — PREDNISONE 10 MG PO TABS: ORAL_TABLET | ORAL | Status: DC

## 2015-05-23 NOTE — Telephone Encounter (Signed)
Thank you :)

## 2015-05-23 NOTE — Patient Instructions (Signed)
5 mL (1 teaspoon) of cough syrup. Don't drive or make important decisions while taking this....just sleep and rest.   Prednisone with breakfast or lunch at the latest.  6 tablets on day 1, 5 tablets on day 2, 4 tablets on day 3, 3 tablets on day 4, 2 tablets day 5, 1 tablet on day 6...done! Take tablets all together not spaced out Don't take with NSAIDs (Ibuprofen, Aleve, Naproxen, Meloxicam ect...)  If the coughing has not impoved in 48 hours: Take the Z-pack  Please take a probiotic ( Align, Floraque or Culturelle) while you are on the antibiotic to prevent a serious antibiotic associated diarrhea  Called clostridium dificile colitis and a vaginal yeast infection

## 2015-05-23 NOTE — Progress Notes (Signed)
Patient ID: ASTRA SPARACO, female    DOB: 01/10/1946  Age: 70 y.o. MRN: [id number]  CC: Cough   HPI Lindsey Harmon presents for CC of worsening symptoms of URI x 6 days.  1) Cough since before Thanksgiving, improved then worsened with sinus congestion, drainage, ST Cough- started on Sunday   Denies chills, fever, sweats  Treatment to date: Tessalon, Claritin, flonase- not helpful  Takes Keflex once daily for UTI   Sick contacts:Denies   History Sorsha has a past medical history of Hypertension; Pure hypercholesterolemia; GERD (gastroesophageal reflux disease); Urinary retention; Fibrocystic breast disease; and Abnormal liver function.   She has past surgical history that includes Appendectomy; Abdominal hysterectomy; and Bladder Tack.   Her family history includes Cancer in her father; Heart disease in her paternal grandfather; Hypertension in her mother; Pneumonia in her mother.She reports that she has never smoked. She has never used smokeless tobacco. She reports that she does not drink alcohol or use illicit drugs.  Outpatient Prescriptions Prior to Visit  Medication Sig Dispense Refill  . bisoprolol (ZEBETA) 5 MG tablet TAKE 1 TABLET BY MOUTH DAILY. 90 tablet 2  . Calcium Carbonate-Vitamin D (CALTRATE 600+D PO) Take by mouth.    . hydrochlorothiazide (HYDRODIURIL) 25 MG tablet TAKE 1 TABLET BY MOUTH DAILY (Patient taking differently: TAKE 1/2 TABLET BY MOUTH DAILY) 90 tablet 1  . losartan (COZAAR) 100 MG tablet TAKE 1 TABLET (100 MG TOTAL) BY MOUTH DAILY. 90 tablet 1  . omeprazole (PRILOSEC) 20 MG capsule TAKE 1 CAPSULE (20 MG TOTAL) BY MOUTH 2 (TWO) TIMES DAILY. 180 capsule 1  . sertraline (ZOLOFT) 50 MG tablet TAKE 1 AND 1/2 TABLETS EVERY DAY 135 tablet 0  . cephALEXin (KEFLEX) 250 MG capsule Take by mouth 4 (four) times daily. Reported on 05/23/2015    . Omega-3 Fatty Acids (FISH OIL) 1200 MG CAPS Take by mouth daily.    . simvastatin (ZOCOR) 20 MG tablet TAKE 1 TABLET (20 MG  TOTAL) BY MOUTH DAILY. 90 tablet 1  . benzonatate (TESSALON) 200 MG capsule Take 1 capsule (200 mg total) by mouth 2 (two) times daily as needed for cough. (Patient not taking: Reported on 05/23/2015) 20 capsule 0  . fluticasone (FLONASE) 50 MCG/ACT nasal spray Place 2 sprays into both nostrils daily. (Patient not taking: Reported on 05/23/2015) 16 g 6  . loratadine (CLARITIN) 10 MG tablet Take 1 tablet (10 mg total) by mouth daily. (Patient not taking: Reported on 05/23/2015) 30 tablet 1  . nystatin cream (MYCOSTATIN) Apply 1 application topically 2 (two) times daily. (Patient not taking: Reported on 04/14/2015) 30 g 0  . sulfamethoxazole-trimethoprim (BACTRIM DS,SEPTRA DS) 800-160 MG per tablet Take 1 tablet by mouth daily. Reported on 05/23/2015     No facility-administered medications prior to visit.    ROS Review of Systems  Constitutional: Negative for fever, chills, diaphoresis and fatigue.  HENT: Positive for congestion, postnasal drip, rhinorrhea, sinus pressure and sore throat. Negative for ear discharge, ear pain, sneezing, trouble swallowing and voice change.   Eyes: Negative for visual disturbance.  Respiratory: Negative for chest tightness, shortness of breath and wheezing.   Cardiovascular: Negative for chest pain, palpitations and leg swelling.  Gastrointestinal: Negative for nausea, vomiting and diarrhea.  Skin: Negative for rash.  Neurological: Positive for headaches. Negative for dizziness.    Objective:  BP 136/78 mmHg  Pulse 63  Temp(Src) 99 F (37.2 C)  Ht 5\' 4"  (1.626 m)  Wt 183 lb (83.008 kg)  BMI 31.40 kg/m2  SpO2 97%  Physical Exam  Constitutional: She is oriented to person, place, and time. She appears well-developed and well-nourished. No distress.  HENT:  Head: Normocephalic and atraumatic.  Right Ear: External ear normal.  Left Ear: External ear normal.  Mouth/Throat: No oropharyngeal exudate.  Eyes: Conjunctivae and EOM are normal. Pupils are equal,  round, and reactive to light. Right eye exhibits no discharge. Left eye exhibits no discharge. No scleral icterus.  Neck: Normal range of motion. Neck supple.  Cardiovascular: Normal rate, regular rhythm and normal heart sounds.  Exam reveals no gallop and no friction rub.   No murmur heard. Pulmonary/Chest: Effort normal and breath sounds normal. No respiratory distress. She has no wheezes. She has no rales. She exhibits no tenderness.  Lymphadenopathy:    She has no cervical adenopathy.  Neurological: She is alert and oriented to person, place, and time. No cranial nerve deficit. She exhibits normal muscle tone. Coordination normal.  Skin: Skin is warm and dry. No rash noted. She is not diaphoretic.  Psychiatric: She has a normal mood and affect. Her behavior is normal. Judgment and thought content normal.      Assessment & Plan:   Yancy was seen today for cough.  Diagnoses and all orders for this visit:  Viral URI with cough  Other orders -     predniSONE (DELTASONE) 10 MG tablet; Take 6 tablets by mouth on day 1 with breakfast then decrease by 1 tablet each day until gone. -     chlorpheniramine-HYDROcodone (TUSSIONEX PENNKINETIC ER) 10-8 MG/5ML SUER; Take 5 mLs by mouth at bedtime as needed for cough. -     azithromycin (ZITHROMAX) 250 MG tablet; Take 2 tablets by mouth on day 1, take 1 tablet by mouth each day after for 4 days.  I have discontinued Ms. Deal's sulfamethoxazole-trimethoprim, nystatin cream, cephALEXin, loratadine, fluticasone, and benzonatate. I am also having her start on predniSONE, chlorpheniramine-HYDROcodone, and azithromycin. Additionally, I am having her maintain her Fish Oil, Calcium Carbonate-Vitamin D (CALTRATE 600+D PO), hydrochlorothiazide, losartan, omeprazole, simvastatin, bisoprolol, and sertraline.  Meds ordered this encounter  Medications  . predniSONE (DELTASONE) 10 MG tablet    Sig: Take 6 tablets by mouth on day 1 with breakfast then decrease  by 1 tablet each day until gone.    Dispense:  21 tablet    Refill:  0    Order Specific Question:  Supervising Provider    Answer:  Deborra Medina L [[id number]]  . chlorpheniramine-HYDROcodone (TUSSIONEX PENNKINETIC ER) 10-8 MG/5ML SUER    Sig: Take 5 mLs by mouth at bedtime as needed for cough.    Dispense:  115 mL    Refill:  0    Order Specific Question:  Supervising Provider    Answer:  Derrel Nip, TERESA L [[id number]]  . azithromycin (ZITHROMAX) 250 MG tablet    Sig: Take 2 tablets by mouth on day 1, take 1 tablet by mouth each day after for 4 days.    Dispense:  6 each    Refill:  0    Order Specific Question:  Supervising Provider    Answer:  Crecencio Mc [[id number]]     Follow-up: Return if symptoms worsen or fail to improve.

## 2015-05-25 ENCOUNTER — Other Ambulatory Visit: Payer: Self-pay | Admitting: Internal Medicine

## 2015-05-26 ENCOUNTER — Other Ambulatory Visit: Payer: Self-pay | Admitting: Obstetrics and Gynecology

## 2015-05-26 ENCOUNTER — Ambulatory Visit
Admission: RE | Admit: 2015-05-26 | Discharge: 2015-05-26 | Disposition: A | Discharge status: Home / Self Care | Payer: Medicare Other | Source: Ambulatory Visit | Attending: Obstetrics and Gynecology | Admitting: Obstetrics and Gynecology

## 2015-05-26 DIAGNOSIS — Z1231 Encounter for screening mammogram for malignant neoplasm of breast: Secondary | ICD-10-CM

## 2015-06-01 ENCOUNTER — Encounter: Payer: Self-pay | Admitting: Nurse Practitioner

## 2015-06-01 NOTE — Assessment & Plan Note (Signed)
Established problem worsening Symptoms have worsened despite supportive treatment  Tussionex given for cough- script given to pt to take to pharmacy; cautions given about drowsy effects Prednisone taper given with instructions verbally and on AVS Z-pack for pna coverage if no improvement with Tussionex and prednisone in 48 hrs RTC if worsening or failure to improve

## 2015-06-04 ENCOUNTER — Telehealth: Payer: Self-pay | Admitting: Internal Medicine

## 2015-06-04 NOTE — Telephone Encounter (Signed)
Pt called wanting to know does she still need to come in for her follow up appt? She had one on 06/06/15. But came in sooner due to being sick she saw [facility]. Call pt @ [phone] or cell [phone]. Thank You!

## 2015-06-04 NOTE — Telephone Encounter (Signed)
Yes.  She still needs to keep her f/u with me.

## 2015-06-04 NOTE — Telephone Encounter (Signed)
Please advise./tvw 

## 2015-06-05 NOTE — Telephone Encounter (Signed)
Pt is scheduled for her follow up appt.

## 2015-06-06 ENCOUNTER — Ambulatory Visit: Payer: Medicare Other | Admitting: Internal Medicine

## 2015-06-09 ENCOUNTER — Ambulatory Visit (INDEPENDENT_AMBULATORY_CARE_PROVIDER_SITE_OTHER): Payer: Medicare Other | Admitting: Internal Medicine

## 2015-06-09 ENCOUNTER — Encounter: Payer: Self-pay | Admitting: Internal Medicine

## 2015-06-09 VITALS — BP 124/90 | HR 59 | Temp 98.3°F | Resp 18 | Ht 64.0 in | Wt 183.5 lb

## 2015-06-09 DIAGNOSIS — N289 Disorder of kidney and ureter, unspecified: Secondary | ICD-10-CM

## 2015-06-09 DIAGNOSIS — F429 Obsessive-compulsive disorder, unspecified: Secondary | ICD-10-CM | POA: Diagnosis not present

## 2015-06-09 DIAGNOSIS — B9789 Other viral agents as the cause of diseases classified elsewhere: Secondary | ICD-10-CM

## 2015-06-09 DIAGNOSIS — E7800 Pure hypercholesterolemia, unspecified: Secondary | ICD-10-CM

## 2015-06-09 DIAGNOSIS — J069 Acute upper respiratory infection, unspecified: Secondary | ICD-10-CM

## 2015-06-09 DIAGNOSIS — K7689 Other specified diseases of liver: Secondary | ICD-10-CM

## 2015-06-09 DIAGNOSIS — R945 Abnormal results of liver function studies: Secondary | ICD-10-CM

## 2015-06-09 DIAGNOSIS — R739 Hyperglycemia, unspecified: Secondary | ICD-10-CM

## 2015-06-09 DIAGNOSIS — I10 Essential (primary) hypertension: Secondary | ICD-10-CM

## 2015-06-09 DIAGNOSIS — E669 Obesity, unspecified: Secondary | ICD-10-CM

## 2015-06-09 NOTE — Patient Instructions (Signed)
Saline nasal spray - flush nose at least 2-3x/day  Zantac (ranitidine) 150mg  - take one tablet before bed.    Robitussin DM twice a day as needed.

## 2015-06-09 NOTE — Progress Notes (Signed)
Pre-visit discussion using our clinic review tool. No additional management support is needed unless otherwise documented below in the visit note.  

## 2015-06-09 NOTE — Progress Notes (Signed)
Patient ID: Lindsey Harmon, female   DOB: [birth year], 70 y.o.   MRN: [id number]   Subjective:    Patient ID: Lindsey Harmon, female    DOB: 1945/07/10, 70 y.o.   MRN: [id number]  HPI  Patient with past history of hypercholesterolemia, GERD, abnormal liver function tests and hypertension.  She comes in today to follow up on these issues.  She was recently evaluated and treated for URI.  Treated with prednisone and zpak.  See Morey Hummingbird Doss's note for details.  She is better.  Still with some residual cough.  Also reports clearing her throat.  No acid reflux reported.  She is taking prilosec bid.  No significant nasal congestion.  No deep chest congestion. No sob.  No nausea or vomiting.  Bowels stable.  Anxiety better on zoloft.  Being treated for OCD as well.  Desires no further intervention.  Feels things are stable.    Past Medical History  Diagnosis Date  . Hypertension   . Pure hypercholesterolemia   . GERD (gastroesophageal reflux disease)   . Urinary retention   . Fibrocystic breast disease   . Abnormal liver function    Past Surgical History  Procedure Laterality Date  . Appendectomy    . Abdominal hysterectomy    . Bladder tack     Family History  Problem Relation Age of Onset  . Pneumonia Mother     died of presumed aspiration pneumonia  . Hypertension Mother   . Cancer Father     prostate  . Heart disease Paternal Grandfather     myocardial infarction   Social History   Social History  . Marital Status: Married    Spouse Name: N/A  . Number of Children: N/A  . Years of Education: N/A   Social History Main Topics  . Smoking status: Never Smoker   . Smokeless tobacco: Never Used  . Alcohol Use: No  . Drug Use: No  . Sexual Activity: Not Asked   Other Topics Concern  . None   Social History Narrative    Outpatient Encounter Prescriptions as of 06/09/2015  Medication Sig  . bisoprolol (ZEBETA) 5 MG tablet TAKE 1 TABLET BY MOUTH DAILY.  . Calcium  Carbonate-Vitamin D (CALTRATE 600+D PO) Take by mouth.  . cephALEXin (KEFLEX) 250 MG capsule Take 250 mg by mouth daily.   . hydrochlorothiazide (HYDRODIURIL) 25 MG tablet TAKE 1 TABLET BY MOUTH DAILY (Patient taking differently: TAKE 1/2 TABLET BY MOUTH DAILY)  . losartan (COZAAR) 100 MG tablet TAKE 1 TABLET (100 MG TOTAL) BY MOUTH DAILY.  Marland Kitchen Omega-3 Fatty Acids (FISH OIL) 1200 MG CAPS Take by mouth daily.  Marland Kitchen omeprazole (PRILOSEC) 20 MG capsule TAKE 1 CAPSULE (20 MG TOTAL) BY MOUTH 2 (TWO) TIMES DAILY.  Marland Kitchen sertraline (ZOLOFT) 50 MG tablet TAKE 1 AND 1/2 TABLETS EVERY DAY  . simvastatin (ZOCOR) 20 MG tablet TAKE 1 TABLET (20 MG TOTAL) BY MOUTH DAILY.  . [DISCONTINUED] azithromycin (ZITHROMAX) 250 MG tablet Take 2 tablets by mouth on day 1, take 1 tablet by mouth each day after for 4 days.  . [DISCONTINUED] chlorpheniramine-HYDROcodone (TUSSIONEX PENNKINETIC ER) 10-8 MG/5ML SUER Take 5 mLs by mouth at bedtime as needed for cough.  . [DISCONTINUED] hydrochlorothiazide (HYDRODIURIL) 25 MG tablet TAKE 1 TABLET BY MOUTH DAILY  . [DISCONTINUED] losartan (COZAAR) 100 MG tablet TAKE 1 TABLET (100 MG TOTAL) BY MOUTH DAILY.  . [DISCONTINUED] omeprazole (PRILOSEC) 20 MG capsule TAKE 1 CAPSULE (20 MG TOTAL) BY MOUTH  2 (TWO) TIMES DAILY.  . [DISCONTINUED] predniSONE (DELTASONE) 10 MG tablet Take 6 tablets by mouth on day 1 with breakfast then decrease by 1 tablet each day until gone.  . [DISCONTINUED] simvastatin (ZOCOR) 20 MG tablet TAKE 1 TABLET (20 MG TOTAL) BY MOUTH DAILY.   No facility-administered encounter medications on file as of 06/09/2015.    Review of Systems  Constitutional: Negative for appetite change and unexpected weight change.  HENT: Positive for congestion. Negative for sinus pressure.   Eyes: Negative for discharge and redness.  Respiratory: Positive for cough. Negative for chest tightness and shortness of breath.   Cardiovascular: Negative for chest pain, palpitations and leg swelling.    Gastrointestinal: Negative for nausea, vomiting, abdominal pain and diarrhea.  Genitourinary: Negative for dysuria and difficulty urinating.  Musculoskeletal: Negative for back pain and joint swelling.  Skin: Negative for color change and rash.  Neurological: Negative for dizziness, light-headedness and headaches.  Psychiatric/Behavioral: Negative for dysphoric mood and agitation.       Objective:     Blood pressure rechecked by me:  140-142/84  Physical Exam  Constitutional: She appears well-developed and well-nourished. No distress.  HENT:  Mouth/Throat: Oropharynx is clear and moist.  Slightly erythematous turbinates.   Neck: Neck supple. No thyromegaly present.  Cardiovascular: Normal rate and regular rhythm.   Pulmonary/Chest: Breath sounds normal. No respiratory distress. She has no wheezes.  Abdominal: Soft. Bowel sounds are normal. There is no tenderness.  Musculoskeletal: She exhibits no edema or tenderness.  Lymphadenopathy:    She has no cervical adenopathy.  Skin: No rash noted. No erythema.  Psychiatric: She has a normal mood and affect. Her behavior is normal.    BP 124/90 mmHg  Pulse 59  Temp(Src) 98.3 F (36.8 C) (Oral)  Resp 18  Ht '5\' 4"'  (1.626 m)  Wt 183 lb 8 oz (83.235 kg)  BMI 31.48 kg/m2  SpO2 97% Wt Readings from Last 3 Encounters:  06/09/15 183 lb 8 oz (83.235 kg)  05/23/15 183 lb (83.008 kg)  02/04/15 182 lb 8 oz (82.781 kg)     Lab Results  Component Value Date   WBC 8.3 05/28/2014   HGB 13.6 05/28/2014   HCT 41.4 05/28/2014   PLT 239.0 05/28/2014   GLUCOSE 83 03/31/2015   CHOL 162 01/31/2015   TRIG 179.0* 01/31/2015   HDL 41.70 01/31/2015   LDLDIRECT 102.3 05/28/2014   LDLCALC 84 01/31/2015   ALT 28 01/31/2015   AST 25 01/31/2015   NA 138 03/31/2015   K 4.2 03/31/2015   CL 100 03/31/2015   CREATININE 0.89 03/31/2015   BUN 17 03/31/2015   CO2 30 03/31/2015   TSH 3.79 05/28/2014   HGBA1C 6.0 01/31/2015    Mm Screening  Breast Tomo Bilateral  05/26/2015  CLINICAL DATA:  Screening. EXAM: DIGITAL SCREENING BILATERAL MAMMOGRAM WITH 3D TOMO WITH CAD COMPARISON:  Previous exam(s). ACR Breast Density Category b: There are scattered areas of fibroglandular density. FINDINGS: There are no findings suspicious for malignancy. Images were processed with CAD. IMPRESSION: No mammographic evidence of malignancy. A result letter of this screening mammogram will be mailed directly to the patient. RECOMMENDATION: Screening mammogram in one year. (Code:SM-B-01Y) BI-RADS CATEGORY  1: Negative. Electronically Signed   By: Abelardo Diesel M.D.   On: 05/26/2015 13:19       Assessment & Plan:   Problem List Items Addressed This Visit    Abnormal liver function    Worked up by GI.  Felt  to be due to fatty liver.  Last liver panel wnl.  Follow liver function tests.        Relevant Orders   Hepatic function panel   Essential hypertension, benign - Primary    Blood pressure slightly elevated today.  Have her spot check her pressure and record readings.  Get her back in soon to reassess.  Hold on making changes in her medication.  Follow metabolic panel.       Relevant Orders   CBC with Differential/Platelet   Basic metabolic panel   Hypercholesterolemia    On simvastatin.  Low cholesterol diet and exercise.  Follow lipid panel and liver function tests.   Lab Results  Component Value Date   CHOL 162 01/31/2015   HDL 41.70 01/31/2015   LDLCALC 84 01/31/2015   LDLDIRECT 102.3 05/28/2014   TRIG 179.0* 01/31/2015   CHOLHDL 4 01/31/2015        Relevant Orders   Lipid panel   Hyperglycemia    Low carb diet and exercise.  Follow met b and a1c.   Lab Results  Component Value Date   HGBA1C 6.0 01/31/2015         Relevant Orders   TSH   Hemoglobin A1c   Obesity (BMI 30-39.9)    Diet and exercise.       Obsessive compulsive disorder    On zoloft and doing better.  Follow.  Desires no further intervention.       Renal  insufficiency    Last check improved.  Stay hydrated.        Viral URI with cough    Previously treated with prednisone and zpak.  With some residual cough and clearing of her throat.  She is on prilosec as directed.  Add zantac in the pm.  Also, saline nasal spray as directed.  Robitussin DM as directed.  Do not feel abx warranted.  Follow.  Notify me if persistent symptoms.  If persistent cough, will xray.       Relevant Medications   cephALEXin (KEFLEX) 250 MG capsule       Einar Pheasant, MD

## 2015-06-10 ENCOUNTER — Encounter: Payer: Self-pay | Admitting: Internal Medicine

## 2015-06-10 NOTE — Assessment & Plan Note (Signed)
Low carb diet and exercise.  Follow met b and a1c.   Lab Results  Component Value Date   HGBA1C 6.0 01/31/2015

## 2015-06-10 NOTE — Assessment & Plan Note (Signed)
Previously treated with prednisone and zpak.  With some residual cough and clearing of her throat.  She is on prilosec as directed.  Add zantac in the pm.  Also, saline nasal spray as directed.  Robitussin DM as directed.  Do not feel abx warranted.  Follow.  Notify me if persistent symptoms.  If persistent cough, will xray.

## 2015-06-10 NOTE — Assessment & Plan Note (Signed)
Worked up by GI.  Felt to be due to fatty liver.  Last liver panel wnl.  Follow liver function tests.

## 2015-06-10 NOTE — Assessment & Plan Note (Signed)
Blood pressure slightly elevated today.  Have her spot check her pressure and record readings.  Get her back in soon to reassess.  Hold on making changes in her medication.  Follow metabolic panel.

## 2015-06-10 NOTE — Assessment & Plan Note (Signed)
On simvastatin.  Low cholesterol diet and exercise.  Follow lipid panel and liver function tests.   Lab Results  Component Value Date   CHOL 162 01/31/2015   HDL 41.70 01/31/2015   LDLCALC 84 01/31/2015   LDLDIRECT 102.3 05/28/2014   TRIG 179.0* 01/31/2015   CHOLHDL 4 01/31/2015   

## 2015-06-10 NOTE — Assessment & Plan Note (Signed)
Diet and exercise.   

## 2015-06-10 NOTE — Assessment & Plan Note (Signed)
On zoloft and doing better.  Follow.  Desires no further intervention.

## 2015-06-10 NOTE — Assessment & Plan Note (Signed)
Last check improved.  Stay hydrated.

## 2015-07-10 ENCOUNTER — Other Ambulatory Visit (INDEPENDENT_AMBULATORY_CARE_PROVIDER_SITE_OTHER): Payer: Medicare Other

## 2015-07-10 DIAGNOSIS — E7800 Pure hypercholesterolemia, unspecified: Secondary | ICD-10-CM

## 2015-07-10 DIAGNOSIS — I10 Essential (primary) hypertension: Secondary | ICD-10-CM | POA: Diagnosis not present

## 2015-07-10 DIAGNOSIS — K7689 Other specified diseases of liver: Secondary | ICD-10-CM

## 2015-07-10 DIAGNOSIS — R739 Hyperglycemia, unspecified: Secondary | ICD-10-CM | POA: Diagnosis not present

## 2015-07-10 DIAGNOSIS — R945 Abnormal results of liver function studies: Secondary | ICD-10-CM

## 2015-07-10 LAB — CBC WITH DIFFERENTIAL/PLATELET
Basophils Absolute: 0 10*3/uL (ref 0.0–0.1)
Basophils Relative: 0.7 % (ref 0.0–3.0)
Eosinophils Absolute: 0.2 10*3/uL (ref 0.0–0.7)
Eosinophils Relative: 2.6 % (ref 0.0–5.0)
HCT: 39.2 % (ref 36.0–46.0)
Hemoglobin: 13 g/dL (ref 12.0–15.0)
LYMPHS ABS: 1.7 10*3/uL (ref 0.7–4.0)
Lymphocytes Relative: 27 % (ref 12.0–46.0)
MCHC: 33.2 g/dL (ref 30.0–36.0)
MCV: 87.7 fl (ref 78.0–100.0)
MONOS PCT: 5.4 % (ref 3.0–12.0)
Monocytes Absolute: 0.3 10*3/uL (ref 0.1–1.0)
NEUTROS PCT: 64.3 % (ref 43.0–77.0)
Neutro Abs: 4 10*3/uL (ref 1.4–7.7)
Platelets: 226 10*3/uL (ref 150.0–400.0)
RBC: 4.47 Mil/uL (ref 3.87–5.11)
RDW: 14.8 % (ref 11.5–15.5)
WBC: 6.3 10*3/uL (ref 4.0–10.5)

## 2015-07-10 LAB — BASIC METABOLIC PANEL
BUN: 14 mg/dL (ref 6–23)
CHLORIDE: 100 meq/L (ref 96–112)
CO2: 31 meq/L (ref 19–32)
Calcium: 9.5 mg/dL (ref 8.4–10.5)
Creatinine, Ser: 0.95 mg/dL (ref 0.40–1.20)
GFR: 61.44 mL/min (ref >60.00)
GLUCOSE: 103 mg/dL — AB (ref 70–99)
POTASSIUM: 4 meq/L (ref 3.5–5.1)
Sodium: 138 mEq/L (ref 135–145)

## 2015-07-10 LAB — HEPATIC FUNCTION PANEL
ALBUMIN: 4.3 g/dL (ref 3.5–5.2)
ALK PHOS: 68 U/L (ref 39–117)
ALT: 26 U/L (ref 0–35)
AST: 24 U/L (ref 0–37)
BILIRUBIN DIRECT: 0.1 mg/dL (ref 0.0–0.3)
BILIRUBIN TOTAL: 0.7 mg/dL (ref 0.2–1.2)
Total Protein: 6.9 g/dL (ref 6.0–8.3)

## 2015-07-10 LAB — LIPID PANEL
CHOL/HDL RATIO: 4
Cholesterol: 164 mg/dL (ref 0–200)
HDL: 44.7 mg/dL (ref >39.00)
LDL Cholesterol: 88 mg/dL (ref 0–99)
NONHDL: 119.08
Triglycerides: 157 mg/dL — ABNORMAL HIGH (ref 0.0–149.0)
VLDL: 31.4 mg/dL (ref 0.0–40.0)

## 2015-07-10 LAB — HEMOGLOBIN A1C: Hgb A1c MFr Bld: 6.3 % (ref 4.6–6.5)

## 2015-07-10 LAB — TSH: TSH: 2.29 u[IU]/mL (ref 0.35–4.50)

## 2015-07-11 ENCOUNTER — Encounter: Payer: Self-pay | Admitting: Internal Medicine

## 2015-08-02 ENCOUNTER — Other Ambulatory Visit: Payer: Self-pay | Admitting: Internal Medicine

## 2015-09-09 ENCOUNTER — Ambulatory Visit (INDEPENDENT_AMBULATORY_CARE_PROVIDER_SITE_OTHER): Payer: Medicare Other | Admitting: Internal Medicine

## 2015-09-09 ENCOUNTER — Encounter: Payer: Self-pay | Admitting: Internal Medicine

## 2015-09-09 VITALS — BP 112/60 | HR 59 | Temp 98.4°F | Ht 64.0 in | Wt 184.6 lb

## 2015-09-09 DIAGNOSIS — R945 Abnormal results of liver function studies: Secondary | ICD-10-CM

## 2015-09-09 DIAGNOSIS — F429 Obsessive-compulsive disorder, unspecified: Secondary | ICD-10-CM | POA: Diagnosis not present

## 2015-09-09 DIAGNOSIS — E7800 Pure hypercholesterolemia, unspecified: Secondary | ICD-10-CM

## 2015-09-09 DIAGNOSIS — R739 Hyperglycemia, unspecified: Secondary | ICD-10-CM | POA: Diagnosis not present

## 2015-09-09 DIAGNOSIS — I10 Essential (primary) hypertension: Secondary | ICD-10-CM

## 2015-09-09 DIAGNOSIS — E669 Obesity, unspecified: Secondary | ICD-10-CM | POA: Diagnosis not present

## 2015-09-09 DIAGNOSIS — K7689 Other specified diseases of liver: Secondary | ICD-10-CM

## 2015-09-09 MED ORDER — SIMVASTATIN 20 MG PO TABS: ORAL_TABLET | ORAL | Status: DC

## 2015-09-09 MED ORDER — LOSARTAN POTASSIUM 100 MG PO TABS: ORAL_TABLET | ORAL | Status: DC

## 2015-09-09 NOTE — Progress Notes (Signed)
Patient ID: Lindsey Harmon, female   DOB: [birth year], 70 y.o.   MRN: [id number]   Subjective:    Patient ID: Lindsey Harmon, female    DOB: 04/18/1946, 70 y.o.   MRN: [id number]  HPI  Patient here for a scheduled follow up.  She is doing well.  Feels good.  Taking zoloft and feels this has helped.  Does not feel needs anything more.  Discussed counseling or psych referral for her OCD.  She continues to decline.  Eating and drinking well.  No abdominal pain or cramping.  Bowels stable.     Past Medical History  Diagnosis Date  . Hypertension   . Pure hypercholesterolemia   . GERD (gastroesophageal reflux disease)   . Urinary retention   . Fibrocystic breast disease   . Abnormal liver function    Past Surgical History  Procedure Laterality Date  . Appendectomy    . Abdominal hysterectomy    . Bladder tack     Family History  Problem Relation Age of Onset  . Pneumonia Mother     died of presumed aspiration pneumonia  . Hypertension Mother   . Cancer Father     prostate  . Heart disease Paternal Grandfather     myocardial infarction   Social History   Social History  . Marital Status: Married    Spouse Name: N/A  . Number of Children: N/A  . Years of Education: N/A   Social History Main Topics  . Smoking status: Never Smoker   . Smokeless tobacco: Never Used  . Alcohol Use: No  . Drug Use: No  . Sexual Activity: Not Asked   Other Topics Concern  . None   Social History Narrative    Outpatient Encounter Prescriptions as of 09/09/2015  Medication Sig  . bisoprolol (ZEBETA) 5 MG tablet TAKE 1 TABLET BY MOUTH DAILY.  . Calcium Carbonate-Vitamin D (CALTRATE 600+D PO) Take by mouth.  . cephALEXin (KEFLEX) 250 MG capsule Take 250 mg by mouth daily.   . hydrochlorothiazide (HYDRODIURIL) 25 MG tablet TAKE 1 TABLET BY MOUTH DAILY (Patient taking differently: TAKE 1/2 TABLET BY MOUTH DAILY)  . losartan (COZAAR) 100 MG tablet TAKE 1 TABLET (100 MG TOTAL) BY MOUTH DAILY.  Marland Kitchen  Omega-3 Fatty Acids (FISH OIL) 1200 MG CAPS Take by mouth daily.  Marland Kitchen omeprazole (PRILOSEC) 20 MG capsule TAKE 1 CAPSULE (20 MG TOTAL) BY MOUTH 2 (TWO) TIMES DAILY.  Marland Kitchen sertraline (ZOLOFT) 50 MG tablet TAKE 1 AND 1/2 TABLETS EVERY DAY  . simvastatin (ZOCOR) 20 MG tablet TAKE 1 TABLET (20 MG TOTAL) BY MOUTH DAILY.  . [DISCONTINUED] losartan (COZAAR) 100 MG tablet TAKE 1 TABLET (100 MG TOTAL) BY MOUTH DAILY.  . [DISCONTINUED] simvastatin (ZOCOR) 20 MG tablet TAKE 1 TABLET (20 MG TOTAL) BY MOUTH DAILY.   No facility-administered encounter medications on file as of 09/09/2015.    Review of Systems  Constitutional: Negative for appetite change and unexpected weight change.  HENT: Negative for congestion and sinus pressure.   Respiratory: Negative for cough, chest tightness and shortness of breath.   Cardiovascular: Negative for chest pain, palpitations and leg swelling.  Gastrointestinal: Negative for nausea, vomiting, abdominal pain and diarrhea.  Genitourinary: Negative for dysuria and difficulty urinating.  Musculoskeletal: Negative for back pain and joint swelling.  Skin: Negative for color change and rash.  Neurological: Negative for dizziness, light-headedness and headaches.  Psychiatric/Behavioral: Negative for dysphoric mood and agitation.       Objective:  Physical Exam  Constitutional: She appears well-developed and well-nourished. No distress.  HENT:  Nose: Nose normal.  Mouth/Throat: Oropharynx is clear and moist.  Neck: Neck supple. No thyromegaly present.  Cardiovascular: Normal rate and regular rhythm.   Pulmonary/Chest: Breath sounds normal. No respiratory distress. She has no wheezes.  Abdominal: Soft. Bowel sounds are normal. There is no tenderness.  Musculoskeletal: She exhibits no edema or tenderness.  Lymphadenopathy:    She has no cervical adenopathy.  Skin: No rash noted. No erythema.  Psychiatric: She has a normal mood and affect. Her behavior is normal.     BP 112/60 mmHg  Pulse 59  Temp(Src) 98.4 F (36.9 C) (Oral)  Ht _0  (1.626 m)  Wt 184 lb 9.6 oz (83.734 kg)  BMI 31.67 kg/m2  SpO2 94% Wt Readings from Last 3 Encounters:  09/09/15 184 lb 9.6 oz (83.734 kg)  06/09/15 183 lb 8 oz (83.235 kg)  05/23/15 183 lb (83.008 kg)     Lab Results  Component Value Date   WBC 6.3 07/10/2015   HGB 13.0 07/10/2015   HCT 39.2 07/10/2015   PLT 226.0 07/10/2015   GLUCOSE 103* 07/10/2015   CHOL 164 07/10/2015   TRIG 157.0* 07/10/2015   HDL 44.70 07/10/2015   LDLDIRECT 102.3 05/28/2014   LDLCALC 88 07/10/2015   ALT 26 07/10/2015   AST 24 07/10/2015   NA 138 07/10/2015   K 4.0 07/10/2015   CL 100 07/10/2015   CREATININE 0.95 07/10/2015   BUN 14 07/10/2015   CO2 31 07/10/2015   TSH 2.29 07/10/2015   HGBA1C 6.3 07/10/2015    Mm Screening Breast Tomo Bilateral  05/26/2015  CLINICAL DATA:  Screening. EXAM: DIGITAL SCREENING BILATERAL MAMMOGRAM WITH 3D TOMO WITH CAD COMPARISON:  Previous exam(s). ACR Breast Density Category b: There are scattered areas of fibroglandular density. FINDINGS: There are no findings suspicious for malignancy. Images were processed with CAD. IMPRESSION: No mammographic evidence of malignancy. A result letter of this screening mammogram will be mailed directly to the patient. RECOMMENDATION: Screening mammogram in one year. (Code:SM-B-01Y) BI-RADS CATEGORY  1: Negative. Electronically Signed   By: Abelardo Diesel M.D.   On: 05/26/2015 13:19       Assessment & Plan:   Problem List Items Addressed This Visit    Abnormal liver function    Worked up by GI.  Felt to be due to fatty liver.  Follow liver panel.   Lab Results  Component Value Date   ALT 26 07/10/2015   AST 24 07/10/2015   ALKPHOS 68 07/10/2015   BILITOT 0.7 07/10/2015        Essential hypertension, benign - Primary    Blood pressure under good control.  Continue same medication regimen.  Follow pressures.  Follow metabolic panel.         Relevant Medications   simvastatin (ZOCOR) 20 MG tablet   losartan (COZAAR) 100 MG tablet   Other Relevant Orders   Basic metabolic panel   Hypercholesterolemia    On simvastatin.  Low cholesterol diet and exercise.  Follow lipid panel and liver function tests.        Relevant Medications   simvastatin (ZOCOR) 20 MG tablet   losartan (COZAAR) 100 MG tablet   Other Relevant Orders   Lipid panel   Hepatic function panel   Hyperglycemia    Low carb diet and exercise.  Follow met b and a1c.       Relevant Orders   Hemoglobin A1c  Obesity (BMI 30-39.9)    Diet and exercise.        Obsessive compulsive disorder    On zoloft.  Feels better.  Declines further evaluation.            Einar Pheasant, MD

## 2015-09-15 ENCOUNTER — Encounter: Payer: Self-pay | Admitting: Internal Medicine

## 2015-09-15 NOTE — Assessment & Plan Note (Signed)
Worked up by GI.  Felt to be due to fatty liver.  Follow liver panel.   Lab Results  Component Value Date   ALT 26 07/10/2015   AST 24 07/10/2015   ALKPHOS 68 07/10/2015   BILITOT 0.7 07/10/2015

## 2015-09-15 NOTE — Assessment & Plan Note (Signed)
On simvastatin.  Low cholesterol diet and exercise.  Follow lipid panel and liver function tests.   

## 2015-09-15 NOTE — Assessment & Plan Note (Signed)
Low carb diet and exercise.  Follow met b and a1c.  

## 2015-09-15 NOTE — Assessment & Plan Note (Signed)
Blood pressure under good control.  Continue same medication regimen.  Follow pressures.  Follow metabolic panel.   

## 2015-09-15 NOTE — Assessment & Plan Note (Signed)
On zoloft.  Feels better.  Declines further evaluation.

## 2015-09-15 NOTE — Assessment & Plan Note (Signed)
Diet and exercise.   

## 2015-11-11 ENCOUNTER — Encounter: Payer: Self-pay | Admitting: Internal Medicine

## 2015-11-11 ENCOUNTER — Other Ambulatory Visit (INDEPENDENT_AMBULATORY_CARE_PROVIDER_SITE_OTHER): Payer: Medicare Other

## 2015-11-11 ENCOUNTER — Other Ambulatory Visit: Payer: Self-pay | Admitting: Family Medicine

## 2015-11-11 DIAGNOSIS — R739 Hyperglycemia, unspecified: Secondary | ICD-10-CM

## 2015-11-11 DIAGNOSIS — I10 Essential (primary) hypertension: Secondary | ICD-10-CM

## 2015-11-11 DIAGNOSIS — E7800 Pure hypercholesterolemia, unspecified: Secondary | ICD-10-CM | POA: Diagnosis not present

## 2015-11-11 LAB — BASIC METABOLIC PANEL
BUN: 14 mg/dL (ref 6–23)
CHLORIDE: 103 meq/L (ref 96–112)
CO2: 29 mEq/L (ref 19–32)
CREATININE: 0.97 mg/dL (ref 0.40–1.20)
Calcium: 9.6 mg/dL (ref 8.4–10.5)
GFR: 59.92 mL/min — AB (ref >60.00)
Glucose, Bld: 99 mg/dL (ref 70–99)
Potassium: 3.8 mEq/L (ref 3.5–5.1)
Sodium: 138 mEq/L (ref 135–145)

## 2015-11-11 LAB — HEPATIC FUNCTION PANEL
ALBUMIN: 4.2 g/dL (ref 3.5–5.2)
ALT: 25 U/L (ref 0–35)
AST: 25 U/L (ref 0–37)
Alkaline Phosphatase: 66 U/L (ref 39–117)
BILIRUBIN DIRECT: 0.1 mg/dL (ref 0.0–0.3)
TOTAL PROTEIN: 6.9 g/dL (ref 6.0–8.3)
Total Bilirubin: 0.8 mg/dL (ref 0.2–1.2)

## 2015-11-11 LAB — LIPID PANEL
CHOL/HDL RATIO: 4
CHOLESTEROL: 165 mg/dL (ref 0–200)
HDL: 37.1 mg/dL — ABNORMAL LOW (ref >39.00)
NonHDL: 127.7
Triglycerides: 237 mg/dL — ABNORMAL HIGH (ref 0.0–149.0)
VLDL: 47.4 mg/dL — AB (ref 0.0–40.0)

## 2015-11-11 LAB — HEMOGLOBIN A1C: HEMOGLOBIN A1C: 6.2 % (ref 4.6–6.5)

## 2015-11-11 LAB — LDL CHOLESTEROL, DIRECT: LDL DIRECT: 98 mg/dL

## 2015-11-15 ENCOUNTER — Other Ambulatory Visit: Payer: Self-pay | Admitting: Internal Medicine

## 2015-12-29 ENCOUNTER — Other Ambulatory Visit: Payer: Self-pay | Admitting: Internal Medicine

## 2016-01-13 ENCOUNTER — Ambulatory Visit (INDEPENDENT_AMBULATORY_CARE_PROVIDER_SITE_OTHER): Payer: Medicare Other | Admitting: Internal Medicine

## 2016-01-13 ENCOUNTER — Encounter: Payer: Self-pay | Admitting: Internal Medicine

## 2016-01-13 VITALS — BP 112/70 | HR 64 | Temp 98.3°F | Resp 16 | Wt 184.0 lb

## 2016-01-13 DIAGNOSIS — Z1211 Encounter for screening for malignant neoplasm of colon: Secondary | ICD-10-CM | POA: Diagnosis not present

## 2016-01-13 DIAGNOSIS — F429 Obsessive-compulsive disorder, unspecified: Secondary | ICD-10-CM

## 2016-01-13 DIAGNOSIS — I10 Essential (primary) hypertension: Secondary | ICD-10-CM

## 2016-01-13 DIAGNOSIS — N289 Disorder of kidney and ureter, unspecified: Secondary | ICD-10-CM

## 2016-01-13 DIAGNOSIS — K7689 Other specified diseases of liver: Secondary | ICD-10-CM

## 2016-01-13 DIAGNOSIS — E669 Obesity, unspecified: Secondary | ICD-10-CM

## 2016-01-13 DIAGNOSIS — R739 Hyperglycemia, unspecified: Secondary | ICD-10-CM | POA: Diagnosis not present

## 2016-01-13 DIAGNOSIS — R945 Abnormal results of liver function studies: Secondary | ICD-10-CM

## 2016-01-13 DIAGNOSIS — E7800 Pure hypercholesterolemia, unspecified: Secondary | ICD-10-CM

## 2016-01-13 NOTE — Assessment & Plan Note (Signed)
On zoloft.  Stable.  Declines any further evaluation or intervention.  Follow.

## 2016-01-13 NOTE — Assessment & Plan Note (Signed)
Worked up by GI.  Felt to be due to fatty liver.  Last liver panel wnl.

## 2016-01-13 NOTE — Assessment & Plan Note (Signed)
On simvastatin.  Low cholesterol diet and exercise.  Follow lipid panel.  Triglycerides elevated last check.  Follow.

## 2016-01-13 NOTE — Assessment & Plan Note (Signed)
Low carb diet and exercise.  Follow met b and a1c.   Lab Results  Component Value Date   HGBA1C 6.2 11/11/2015

## 2016-01-13 NOTE — Progress Notes (Signed)
Patient ID: Lindsey Harmon, female   DOB: Jul 24, 1945, 70 y.o.   MRN: [id number]   Subjective:    Patient ID: Lindsey Harmon, female    DOB: Oct 31, 1945, 70 y.o.   MRN: [id number]  HPI  Patient here for a scheduled follow up.  She sees Dr Ouida Sills for her gyn care.  She self-caths for urinary retention.  Uses a ring pessary.  Stable.  Recommended yearly f/u - due 10-03/2016.  She is doing well.  We discussed the importance of staying active.  No cardiac symptoms with increased activity or exertion.  No sob.  No acid reflux.  No abdominal pain or cramping.  Bowels stable.  No blood.  Discussed colon screening.  She declines.  Discussed cologuard.  She wants to think about this.  Declines flu shot at this time.     Past Medical History:  Diagnosis Date  . Abnormal liver function   . Fibrocystic breast disease   . GERD (gastroesophageal reflux disease)   . Hypertension   . Pure hypercholesterolemia   . Urinary retention    Past Surgical History:  Procedure Laterality Date  . ABDOMINAL HYSTERECTOMY    . APPENDECTOMY    . Bladder Tack     Family History  Problem Relation Age of Onset  . Pneumonia Mother     died of presumed aspiration pneumonia  . Hypertension Mother   . Cancer Father     prostate  . Heart disease Paternal Grandfather     myocardial infarction   Social History   Social History  . Marital status: Married    Spouse name: N/A  . Number of children: N/A  . Years of education: N/A   Social History Main Topics  . Smoking status: Never Smoker  . Smokeless tobacco: Never Used  . Alcohol use No  . Drug use: No  . Sexual activity: Not Asked   Other Topics Concern  . None   Social History Narrative  . None    Outpatient Encounter Prescriptions as of 01/13/2016  Medication Sig  . bisoprolol (ZEBETA) 5 MG tablet TAKE 1 TABLET BY MOUTH DAILY.  . Calcium Carbonate-Vitamin D (CALTRATE 600+D PO) Take by mouth.  . cephALEXin (KEFLEX) 250 MG capsule Take 250 mg by  mouth daily.   . hydrochlorothiazide (HYDRODIURIL) 25 MG tablet TAKE 1 TABLET BY MOUTH DAILY (Patient taking differently: TAKE 0.5 TABLETS BY MOUTH DAILY)  . losartan (COZAAR) 100 MG tablet TAKE 1 TABLET (100 MG TOTAL) BY MOUTH DAILY.  Marland Kitchen Omega-3 Fatty Acids (FISH OIL) 1200 MG CAPS Take by mouth daily.  Marland Kitchen omeprazole (PRILOSEC) 20 MG capsule TAKE 1 CAPSULE (20 MG TOTAL) BY MOUTH 2 (TWO) TIMES DAILY.  Marland Kitchen sertraline (ZOLOFT) 50 MG tablet TAKE 1 AND 1/2 TABLETS EVERY DAY  . simvastatin (ZOCOR) 20 MG tablet TAKE 1 TABLET (20 MG TOTAL) BY MOUTH DAILY.  . [DISCONTINUED] loratadine (CLARITIN) 10 MG tablet TAKE 1 TABLET (10 MG TOTAL) BY MOUTH DAILY.   No facility-administered encounter medications on file as of 01/13/2016.     Review of Systems  Constitutional: Negative for appetite change and unexpected weight change.  HENT: Negative for congestion and sinus pressure.   Respiratory: Negative for cough, chest tightness and shortness of breath.   Cardiovascular: Negative for chest pain, palpitations and leg swelling.  Gastrointestinal: Negative for abdominal pain, diarrhea, nausea and vomiting.  Genitourinary: Negative for difficulty urinating and dysuria.  Musculoskeletal: Negative for back pain and joint swelling.  Skin: Negative  for color change and rash.  Neurological: Negative for dizziness, light-headedness and headaches.  Psychiatric/Behavioral: Negative for agitation and dysphoric mood.       Handling stress.  Does not feel needs anything more at this time.         Objective:     Blood pressure rechecked by me:  130/82  Physical Exam  Constitutional: She appears well-developed and well-nourished. No distress.  HENT:  Nose: Nose normal.  Mouth/Throat: Oropharynx is clear and moist.  Neck: Neck supple. No thyromegaly present.  Cardiovascular: Normal rate and regular rhythm.   Pulmonary/Chest: Breath sounds normal. No respiratory distress. She has no wheezes.  Abdominal: Soft. Bowel  sounds are normal. There is no tenderness.  Musculoskeletal: She exhibits no edema or tenderness.  Lymphadenopathy:    She has no cervical adenopathy.  Skin: No rash noted. No erythema.  Psychiatric: She has a normal mood and affect. Her behavior is normal.    BP 112/70 (BP Location: Left Arm, Patient Position: Sitting, Cuff Size: Large)   Pulse 64   Temp 98.3 F (36.8 C) (Oral)   Resp 16   Wt 184 lb (83.5 kg)   BMI 31.58 kg/m  Wt Readings from Last 3 Encounters:  01/13/16 184 lb (83.5 kg)  09/09/15 184 lb 9.6 oz (83.7 kg)  06/09/15 183 lb 8 oz (83.2 kg)     Lab Results  Component Value Date   WBC 6.3 07/10/2015   HGB 13.0 07/10/2015   HCT 39.2 07/10/2015   PLT 226.0 07/10/2015   GLUCOSE 99 11/11/2015   CHOL 165 11/11/2015   TRIG 237.0 (H) 11/11/2015   HDL 37.10 (L) 11/11/2015   LDLDIRECT 98.0 11/11/2015   LDLCALC 88 07/10/2015   ALT 25 11/11/2015   AST 25 11/11/2015   NA 138 11/11/2015   K 3.8 11/11/2015   CL 103 11/11/2015   CREATININE 0.97 11/11/2015   BUN 14 11/11/2015   CO2 29 11/11/2015   TSH 2.29 07/10/2015   HGBA1C 6.2 11/11/2015    Mm Screening Breast Tomo Bilateral  Result Date: 05/26/2015 CLINICAL DATA:  Screening. EXAM: DIGITAL SCREENING BILATERAL MAMMOGRAM WITH 3D TOMO WITH CAD COMPARISON:  Previous exam(s). ACR Breast Density Category b: There are scattered areas of fibroglandular density. FINDINGS: There are no findings suspicious for malignancy. Images were processed with CAD. IMPRESSION: No mammographic evidence of malignancy. A result letter of this screening mammogram will be mailed directly to the patient. RECOMMENDATION: Screening mammogram in one year. (Code:SM-B-01Y) BI-RADS CATEGORY  1: Negative. Electronically Signed   By: Abelardo Diesel M.D.   On: 05/26/2015 13:19       Assessment & Plan:   Problem List Items Addressed This Visit    Abnormal liver function    Worked up by GI.  Felt to be due to fatty liver.  Last liver panel wnl.         Essential hypertension, benign    Blood pressure under good control.  Continue same medication regimen.  Follow pressures.  Follow metabolic panel.        Relevant Orders   CBC with Differential/Platelet   TSH   Basic metabolic panel   Hypercholesterolemia    On simvastatin.  Low cholesterol diet and exercise.  Follow lipid panel.  Triglycerides elevated last check.  Follow.       Relevant Orders   Lipid panel   Hepatic function panel   Hyperglycemia    Low carb diet and exercise.  Follow met b and  a1c.   Lab Results  Component Value Date   HGBA1C 6.2 11/11/2015        Relevant Orders   Hemoglobin A1c   Obesity (BMI 30-39.9)    Diet and exercise.  Follow.       Obsessive compulsive disorder    On zoloft.  Stable.  Declines any further evaluation or intervention.  Follow.       Renal insufficiency    Last creatinine stable.  Continue to stay hydrated.  Follow.        Other Visit Diagnoses    Colon cancer screening    -  Primary   she declined colonoscopy or cologuard.  follow.        Einar Pheasant, MD

## 2016-01-13 NOTE — Assessment & Plan Note (Signed)
Diet and exercise.  Follow.  

## 2016-01-13 NOTE — Assessment & Plan Note (Signed)
Last creatinine stable.  Continue to stay hydrated.  Follow.

## 2016-01-13 NOTE — Assessment & Plan Note (Signed)
Blood pressure under good control.  Continue same medication regimen.  Follow pressures.  Follow metabolic panel.   

## 2016-01-23 ENCOUNTER — Other Ambulatory Visit: Payer: Self-pay | Admitting: Internal Medicine

## 2016-01-23 MED ORDER — SERTRALINE HCL 50 MG PO TABS: ORAL_TABLET | ORAL | 1 refills | Status: DC

## 2016-02-17 ENCOUNTER — Ambulatory Visit (INDEPENDENT_AMBULATORY_CARE_PROVIDER_SITE_OTHER): Payer: Medicare Other

## 2016-02-17 DIAGNOSIS — Z23 Encounter for immunization: Secondary | ICD-10-CM | POA: Diagnosis not present

## 2016-02-17 NOTE — Progress Notes (Signed)
Patient received flu shot 

## 2016-04-05 ENCOUNTER — Other Ambulatory Visit: Payer: Self-pay | Admitting: Obstetrics and Gynecology

## 2016-04-05 DIAGNOSIS — Z1231 Encounter for screening mammogram for malignant neoplasm of breast: Secondary | ICD-10-CM

## 2016-05-07 ENCOUNTER — Other Ambulatory Visit: Payer: Self-pay | Admitting: Internal Medicine

## 2016-05-18 ENCOUNTER — Other Ambulatory Visit (INDEPENDENT_AMBULATORY_CARE_PROVIDER_SITE_OTHER): Payer: Medicare Other

## 2016-05-18 DIAGNOSIS — R739 Hyperglycemia, unspecified: Secondary | ICD-10-CM

## 2016-05-18 DIAGNOSIS — I10 Essential (primary) hypertension: Secondary | ICD-10-CM | POA: Diagnosis not present

## 2016-05-18 DIAGNOSIS — E7800 Pure hypercholesterolemia, unspecified: Secondary | ICD-10-CM | POA: Diagnosis not present

## 2016-05-18 LAB — HEPATIC FUNCTION PANEL
ALBUMIN: 4.1 g/dL (ref 3.5–5.2)
ALT: 30 U/L (ref 0–35)
AST: 27 U/L (ref 0–37)
Alkaline Phosphatase: 61 U/L (ref 39–117)
Bilirubin, Direct: 0.1 mg/dL (ref 0.0–0.3)
TOTAL PROTEIN: 6.3 g/dL (ref 6.0–8.3)
Total Bilirubin: 0.8 mg/dL (ref 0.2–1.2)

## 2016-05-18 LAB — CBC WITH DIFFERENTIAL/PLATELET
BASOS PCT: 0.8 % (ref 0.0–3.0)
Basophils Absolute: 0.1 10*3/uL (ref 0.0–0.1)
EOS PCT: 3.2 % (ref 0.0–5.0)
Eosinophils Absolute: 0.2 10*3/uL (ref 0.0–0.7)
HEMATOCRIT: 36.8 % (ref 36.0–46.0)
HEMOGLOBIN: 12.7 g/dL (ref 12.0–15.0)
LYMPHS PCT: 27.1 % (ref 12.0–46.0)
Lymphs Abs: 1.9 10*3/uL (ref 0.7–4.0)
MCHC: 34.4 g/dL (ref 30.0–36.0)
MCV: 84.7 fl (ref 78.0–100.0)
Monocytes Absolute: 0.3 10*3/uL (ref 0.1–1.0)
Monocytes Relative: 5.1 % (ref 3.0–12.0)
Neutro Abs: 4.4 10*3/uL (ref 1.4–7.7)
Neutrophils Relative %: 63.8 % (ref 43.0–77.0)
Platelets: 224 10*3/uL (ref 150.0–400.0)
RBC: 4.35 Mil/uL (ref 3.87–5.11)
RDW: 15.4 % (ref 11.5–15.5)
WBC: 6.9 10*3/uL (ref 4.0–10.5)

## 2016-05-18 LAB — LIPID PANEL
CHOL/HDL RATIO: 3
Cholesterol: 150 mg/dL (ref 0–200)
HDL: 43.6 mg/dL (ref >39.00)
LDL Cholesterol: 67 mg/dL (ref 0–99)
NONHDL: 106.12
TRIGLYCERIDES: 198 mg/dL — AB (ref 0.0–149.0)
VLDL: 39.6 mg/dL (ref 0.0–40.0)

## 2016-05-18 LAB — BASIC METABOLIC PANEL
BUN: 22 mg/dL (ref 6–23)
CHLORIDE: 103 meq/L (ref 96–112)
CO2: 30 mEq/L (ref 19–32)
CREATININE: 0.95 mg/dL (ref 0.40–1.20)
Calcium: 9.3 mg/dL (ref 8.4–10.5)
GFR: 61.29 mL/min (ref >60.00)
Glucose, Bld: 102 mg/dL — ABNORMAL HIGH (ref 70–99)
Potassium: 3.9 mEq/L (ref 3.5–5.1)
Sodium: 139 mEq/L (ref 135–145)

## 2016-05-18 LAB — TSH: TSH: 4.09 u[IU]/mL (ref 0.35–4.50)

## 2016-05-18 LAB — HEMOGLOBIN A1C: HEMOGLOBIN A1C: 6.2 % (ref 4.6–6.5)

## 2016-05-20 ENCOUNTER — Encounter: Payer: Self-pay | Admitting: Internal Medicine

## 2016-05-21 ENCOUNTER — Encounter: Payer: Self-pay | Admitting: Internal Medicine

## 2016-05-21 ENCOUNTER — Ambulatory Visit (INDEPENDENT_AMBULATORY_CARE_PROVIDER_SITE_OTHER): Payer: Medicare Other | Admitting: Internal Medicine

## 2016-05-21 VITALS — BP 124/72 | HR 54 | Temp 97.8°F | Ht 64.0 in | Wt 185.2 lb

## 2016-05-21 DIAGNOSIS — Z1211 Encounter for screening for malignant neoplasm of colon: Secondary | ICD-10-CM

## 2016-05-21 DIAGNOSIS — R945 Abnormal results of liver function studies: Secondary | ICD-10-CM

## 2016-05-21 DIAGNOSIS — E7800 Pure hypercholesterolemia, unspecified: Secondary | ICD-10-CM

## 2016-05-21 DIAGNOSIS — E669 Obesity, unspecified: Secondary | ICD-10-CM | POA: Diagnosis not present

## 2016-05-21 DIAGNOSIS — I10 Essential (primary) hypertension: Secondary | ICD-10-CM

## 2016-05-21 DIAGNOSIS — R739 Hyperglycemia, unspecified: Secondary | ICD-10-CM | POA: Diagnosis not present

## 2016-05-21 DIAGNOSIS — F429 Obsessive-compulsive disorder, unspecified: Secondary | ICD-10-CM

## 2016-05-21 DIAGNOSIS — K7689 Other specified diseases of liver: Secondary | ICD-10-CM

## 2016-05-21 DIAGNOSIS — Z9109 Other allergy status, other than to drugs and biological substances: Secondary | ICD-10-CM | POA: Diagnosis not present

## 2016-05-21 NOTE — Progress Notes (Signed)
Patient ID: REA KALAMA, female   DOB: [birth year], 71 y.o.   MRN: [id number]   Subjective:    Patient ID: PARISH AUGUSTINE, female    DOB: 12/23/1945, 72 y.o.   MRN: [id number]  HPI  Patient here for a scheduled follow up.  Sees Dr Feliberto Gottron for her gyn care.  She self caths for urinary retention.  Has a ring pessary.  Stable.  Just evaluated 04/05/16.  States overall she is doing well.  Feels good.  Tries to stay active.  No chest pain.  No sob.  No acid reflux.  No abdominal pain or cramping.  Bowels stable.  Feels things are stable on zoloft.  Desires no further intervention or treatment.  Discussed the need for colon cancer screening.  She declines colonoscopy.  Agreed to cologuard. Some allergy symptoms.     Past Medical History:  Diagnosis Date  . Abnormal liver function   . Fibrocystic breast disease   . GERD (gastroesophageal reflux disease)   . Hypertension   . Pure hypercholesterolemia   . Urinary retention    Past Surgical History:  Procedure Laterality Date  . ABDOMINAL HYSTERECTOMY    . APPENDECTOMY    . Bladder Tack     Family History  Problem Relation Age of Onset  . Pneumonia Mother     died of presumed aspiration pneumonia  . Hypertension Mother   . Cancer Father     prostate  . Heart disease Paternal Grandfather     myocardial infarction   Social History   Social History  . Marital status: Married    Spouse name: N/A  . Number of children: N/A  . Years of education: N/A   Social History Main Topics  . Smoking status: Never Smoker  . Smokeless tobacco: Never Used  . Alcohol use No  . Drug use: No  . Sexual activity: Not Asked   Other Topics Concern  . None   Social History Narrative  . None    Outpatient Encounter Prescriptions as of 05/21/2016  Medication Sig  . bisoprolol (ZEBETA) 5 MG tablet TAKE 1 TABLET BY MOUTH DAILY.  . Calcium Carbonate-Vitamin D (CALTRATE 600+D PO) Take by mouth.  . cephALEXin (KEFLEX) 250 MG capsule Take 250 mg by  mouth daily.   . hydrochlorothiazide (HYDRODIURIL) 25 MG tablet TAKE 1 TABLET BY MOUTH DAILY  . losartan (COZAAR) 100 MG tablet TAKE 1 TABLET (100 MG TOTAL) BY MOUTH DAILY.  Marland Kitchen Omega-3 Fatty Acids (FISH OIL) 1200 MG CAPS Take by mouth daily.  Marland Kitchen omeprazole (PRILOSEC) 20 MG capsule TAKE 1 CAPSULE (20 MG TOTAL) BY MOUTH 2 (TWO) TIMES DAILY.  Marland Kitchen sertraline (ZOLOFT) 50 MG tablet TAKE 1 AND 1/2 TABLETS EVERY DAY  . simvastatin (ZOCOR) 20 MG tablet TAKE 1 TABLET (20 MG TOTAL) BY MOUTH DAILY.  . fluconazole (DIFLUCAN) 150 MG tablet TAKE 1 TABLET (150 MG TOTAL) BY MOUTH ONCE FOR 1 DOSE. MAY REPEAT TABLET IN 1 WEEK   No facility-administered encounter medications on file as of 05/21/2016.     Review of Systems  Constitutional: Negative for appetite change and unexpected weight change.  HENT: Negative for congestion and sinus pressure.   Respiratory: Negative for cough, chest tightness and shortness of breath.   Cardiovascular: Negative for chest pain, palpitations and leg swelling.  Gastrointestinal: Negative for abdominal pain, diarrhea, nausea and vomiting.  Genitourinary:       Self caths.    Musculoskeletal: Negative for back pain and joint swelling.  Skin: Negative for color change and rash.  Neurological: Negative for dizziness, light-headedness and headaches.  Psychiatric/Behavioral: Negative for agitation and dysphoric mood.       Objective:     Blood pressure rechecked by me:  132/80  Physical Exam  Constitutional: She appears well-developed and well-nourished. No distress.  HENT:  Nose: Nose normal.  Mouth/Throat: Oropharynx is clear and moist.  Neck: Neck supple. No thyromegaly present.  Cardiovascular: Normal rate and regular rhythm.   Pulmonary/Chest: Breath sounds normal. No respiratory distress. She has no wheezes.  Abdominal: Soft. Bowel sounds are normal. There is no tenderness.  Musculoskeletal: She exhibits no edema or tenderness.  Lymphadenopathy:    She has no  cervical adenopathy.  Skin: No rash noted. No erythema.  Psychiatric: She has a normal mood and affect. Her behavior is normal.    BP 124/72   Pulse (!) 54   Temp 97.8 F (36.6 C) (Oral)   Ht _0  (1.626 m)   Wt 185 lb 3.2 oz (84 kg)   SpO2 97%   BMI 31.79 kg/m  Wt Readings from Last 3 Encounters:  05/21/16 185 lb 3.2 oz (84 kg)  01/13/16 184 lb (83.5 kg)  09/09/15 184 lb 9.6 oz (83.7 kg)     Lab Results  Component Value Date   WBC 6.9 05/18/2016   HGB 12.7 05/18/2016   HCT 36.8 05/18/2016   PLT 224.0 05/18/2016   GLUCOSE 102 (H) 05/18/2016   CHOL 150 05/18/2016   TRIG 198.0 (H) 05/18/2016   HDL 43.60 05/18/2016   LDLDIRECT 98.0 11/11/2015   LDLCALC 67 05/18/2016   ALT 30 05/18/2016   AST 27 05/18/2016   NA 139 05/18/2016   K 3.9 05/18/2016   CL 103 05/18/2016   CREATININE 0.95 05/18/2016   BUN 22 05/18/2016   CO2 30 05/18/2016   TSH 4.09 05/18/2016   HGBA1C 6.2 05/18/2016    Mm Screening Breast Tomo Bilateral  Result Date: 05/26/2015 CLINICAL DATA:  Screening. EXAM: DIGITAL SCREENING BILATERAL MAMMOGRAM WITH 3D TOMO WITH CAD COMPARISON:  Previous exam(s). ACR Breast Density Category b: There are scattered areas of fibroglandular density. FINDINGS: There are no findings suspicious for malignancy. Images were processed with CAD. IMPRESSION: No mammographic evidence of malignancy. A result letter of this screening mammogram will be mailed directly to the patient. RECOMMENDATION: Screening mammogram in one year. (Code:SM-B-01Y) BI-RADS CATEGORY  1: Negative. Electronically Signed   By: Abelardo Diesel M.D.   On: 05/26/2015 13:19       Assessment & Plan:   Problem List Items Addressed This Visit    Abnormal liver function    Worked up by GI.  Felt to be due to fatty liver.  Liver function tests just checked and wnl.        Colon cancer screening    Agreed to cologuard.        Essential hypertension, benign    Blood pressure under good control.  Continue same  medication regimen.  Follow pressures.  Follow metabolic panel.        Hypercholesterolemia    On simvastatin.  Low cholesterol diet and exercise.  Follow lipid panel and liver function tests.   Lab Results  Component Value Date   CHOL 150 05/18/2016   HDL 43.60 05/18/2016   LDLCALC 67 05/18/2016   LDLDIRECT 98.0 11/11/2015   TRIG 198.0 (H) 05/18/2016   CHOLHDL 3 05/18/2016        Relevant Orders   Lipid panel  Hepatic function panel   Hyperglycemia    Low carb diet and exercise.  Follow met b and a1c.   Lab Results  Component Value Date   HGBA1C 6.2 05/18/2016        Relevant Orders   Hemoglobin V6K   Basic metabolic panel   Obesity (BMI 30-39.9)    Diet and exercise.  Follow.       Obsessive compulsive disorder    On zoloft.  Feels things are stable.  Desires no further intervention or treatment.  Follow.         Other Visit Diagnoses    Environmental allergies    -  Primary   treat with saline nasal spray and nasacort as directed.  follow.        Einar Pheasant, MD

## 2016-05-21 NOTE — Progress Notes (Signed)
Pre visit review using our clinic review tool, if applicable. No additional management support is needed unless otherwise documented below in the visit note. 

## 2016-05-21 NOTE — Patient Instructions (Signed)
Saline nasal spray - flush nose at least 2-3x/day  nasacort nasal spray - 2 sprays each nostril one time per day.  Do this in the evening.  

## 2016-05-23 ENCOUNTER — Encounter: Payer: Self-pay | Admitting: Internal Medicine

## 2016-05-23 DIAGNOSIS — Z1211 Encounter for screening for malignant neoplasm of colon: Secondary | ICD-10-CM | POA: Insufficient documentation

## 2016-05-23 NOTE — Assessment & Plan Note (Signed)
On zoloft.  Feels things are stable.  Desires no further intervention or treatment.  Follow.

## 2016-05-23 NOTE — Assessment & Plan Note (Signed)
Agreed to cologuard.

## 2016-05-23 NOTE — Assessment & Plan Note (Signed)
Blood pressure under good control.  Continue same medication regimen.  Follow pressures.  Follow metabolic panel.   

## 2016-05-23 NOTE — Assessment & Plan Note (Signed)
Diet and exercise.  Follow.  

## 2016-05-23 NOTE — Assessment & Plan Note (Signed)
On simvastatin.  Low cholesterol diet and exercise.  Follow lipid panel and liver function tests.   Lab Results  Component Value Date   CHOL 150 05/18/2016   HDL 43.60 05/18/2016   LDLCALC 67 05/18/2016   LDLDIRECT 98.0 11/11/2015   TRIG 198.0 (H) 05/18/2016   CHOLHDL 3 05/18/2016

## 2016-05-23 NOTE — Assessment & Plan Note (Signed)
Low carb diet and exercise.  Follow met b and a1c.   Lab Results  Component Value Date   HGBA1C 6.2 05/18/2016

## 2016-05-23 NOTE — Assessment & Plan Note (Signed)
Worked up by GI.  Felt to be due to fatty liver.  Liver function tests just checked and wnl.

## 2016-05-26 ENCOUNTER — Ambulatory Visit
Admission: RE | Admit: 2016-05-26 | Discharge: 2016-05-26 | Disposition: A | Discharge status: Home / Self Care | Payer: Medicare Other | Source: Ambulatory Visit | Attending: Obstetrics and Gynecology | Admitting: Obstetrics and Gynecology

## 2016-05-26 DIAGNOSIS — Z1231 Encounter for screening mammogram for malignant neoplasm of breast: Secondary | ICD-10-CM | POA: Diagnosis not present

## 2016-06-06 ENCOUNTER — Encounter: Payer: Self-pay | Admitting: Internal Medicine

## 2016-06-06 LAB — COLOGUARD: Cologuard: POSITIVE

## 2016-06-17 ENCOUNTER — Telehealth: Payer: Self-pay | Admitting: *Deleted

## 2016-06-17 DIAGNOSIS — Z1211 Encounter for screening for malignant neoplasm of colon: Secondary | ICD-10-CM

## 2016-06-17 NOTE — Telephone Encounter (Signed)
[facility] wanted to know if the colongard was received Contact [phone] Order number [id number]

## 2016-06-21 ENCOUNTER — Telehealth: Payer: Self-pay | Admitting: Internal Medicine

## 2016-06-21 NOTE — Telephone Encounter (Signed)
Pt called back returning your call. Please advise, thank you!  Call pt @ [phone]

## 2016-06-23 ENCOUNTER — Encounter: Payer: Self-pay | Admitting: Internal Medicine

## 2016-06-24 ENCOUNTER — Telehealth: Payer: Self-pay | Admitting: Internal Medicine

## 2016-06-24 NOTE — Telephone Encounter (Signed)
See other message

## 2016-06-24 NOTE — Telephone Encounter (Signed)
Spoke to pt she had some further questions she would like to discuss with you. I informed her that I would let you know. She would like to get referral to GI

## 2016-06-24 NOTE — Telephone Encounter (Signed)
I did receive cologuard results.  They were positive.  Called pt and left message to call office.  Please notify pt that her cologuard test was positive.  This means we need to get her referred for colonoscopy.  If agreeable, let me know and I will place the order for the referral to GI.

## 2016-06-24 NOTE — Telephone Encounter (Signed)
Pt called stating that Dr Nicki Reaper ask her to call. Please advise?  Call pt @ [phone] or cell [phone]. Thank you!

## 2016-06-25 NOTE — Telephone Encounter (Signed)
Called pt and discussed positive cologuard.  Questions answered.  She is agreeable to referral to GI.

## 2016-06-28 NOTE — Telephone Encounter (Signed)
Dr. Nicki Reaper spoke with patient she will call if any questions. Se did not have any at this time.

## 2016-07-15 ENCOUNTER — Other Ambulatory Visit: Payer: Self-pay | Admitting: Internal Medicine

## 2016-08-09 ENCOUNTER — Encounter: Payer: Self-pay | Admitting: *Deleted

## 2016-08-10 ENCOUNTER — Ambulatory Visit: Payer: Medicare Other | Admitting: Anesthesiology

## 2016-08-10 ENCOUNTER — Encounter
Admission: RE | Disposition: A | Discharge status: Home / Self Care | Payer: Self-pay | Source: Ambulatory Visit | Attending: Gastroenterology

## 2016-08-10 ENCOUNTER — Ambulatory Visit
Admission: RE | Admit: 2016-08-10 | Discharge: 2016-08-10 | Disposition: A | Discharge status: Home / Self Care | Payer: Medicare Other | Source: Ambulatory Visit | Attending: Gastroenterology | Admitting: Gastroenterology

## 2016-08-10 ENCOUNTER — Encounter: Payer: Self-pay | Admitting: *Deleted

## 2016-08-10 DIAGNOSIS — K5730 Diverticulosis of large intestine without perforation or abscess without bleeding: Secondary | ICD-10-CM | POA: Insufficient documentation

## 2016-08-10 DIAGNOSIS — D124 Benign neoplasm of descending colon: Secondary | ICD-10-CM | POA: Insufficient documentation

## 2016-08-10 DIAGNOSIS — K635 Polyp of colon: Secondary | ICD-10-CM | POA: Insufficient documentation

## 2016-08-10 DIAGNOSIS — D125 Benign neoplasm of sigmoid colon: Secondary | ICD-10-CM | POA: Diagnosis not present

## 2016-08-10 DIAGNOSIS — I10 Essential (primary) hypertension: Secondary | ICD-10-CM | POA: Diagnosis not present

## 2016-08-10 DIAGNOSIS — Z79899 Other long term (current) drug therapy: Secondary | ICD-10-CM | POA: Insufficient documentation

## 2016-08-10 DIAGNOSIS — Z1211 Encounter for screening for malignant neoplasm of colon: Secondary | ICD-10-CM | POA: Insufficient documentation

## 2016-08-10 DIAGNOSIS — K219 Gastro-esophageal reflux disease without esophagitis: Secondary | ICD-10-CM | POA: Insufficient documentation

## 2016-08-10 DIAGNOSIS — D122 Benign neoplasm of ascending colon: Secondary | ICD-10-CM | POA: Diagnosis not present

## 2016-08-10 DIAGNOSIS — D120 Benign neoplasm of cecum: Secondary | ICD-10-CM | POA: Insufficient documentation

## 2016-08-10 DIAGNOSIS — N6019 Diffuse cystic mastopathy of unspecified breast: Secondary | ICD-10-CM | POA: Diagnosis not present

## 2016-08-10 DIAGNOSIS — E7800 Pure hypercholesterolemia, unspecified: Secondary | ICD-10-CM | POA: Diagnosis not present

## 2016-08-10 DIAGNOSIS — N289 Disorder of kidney and ureter, unspecified: Secondary | ICD-10-CM | POA: Diagnosis not present

## 2016-08-10 DIAGNOSIS — D123 Benign neoplasm of transverse colon: Secondary | ICD-10-CM | POA: Insufficient documentation

## 2016-08-10 HISTORY — DX: Cystocele, unspecified: N81.10

## 2016-08-10 HISTORY — PX: COLONOSCOPY WITH PROPOFOL: SHX5780

## 2016-08-10 HISTORY — DX: Other female genital prolapse: N81.89

## 2016-08-10 HISTORY — DX: Rectocele: N81.6

## 2016-08-10 HISTORY — DX: Intrinsic sphincter deficiency (ISD): N36.42

## 2016-08-10 HISTORY — DX: Stress incontinence (female) (male): N39.3

## 2016-08-10 SURGERY — COLONOSCOPY WITH PROPOFOL
Anesthesia: General

## 2016-08-10 MED ORDER — EPHEDRINE SULFATE 50 MG/ML IJ SOLN
INTRAMUSCULAR | Status: DC | PRN
  Administered 2016-08-10: 10 mg via INTRAVENOUS

## 2016-08-10 MED ORDER — PROPOFOL 10 MG/ML IV BOLUS
INTRAVENOUS | Status: AC
  Filled 2016-08-10: qty 20

## 2016-08-10 MED ORDER — PHENYLEPHRINE HCL 10 MG/ML IJ SOLN
INTRAMUSCULAR | Status: DC | PRN
  Administered 2016-08-10 (×3): 100 ug via INTRAVENOUS

## 2016-08-10 MED ORDER — PROPOFOL 10 MG/ML IV BOLUS
INTRAVENOUS | Status: AC
Start: 2016-08-10 — End: 2016-08-10
  Filled 2016-08-10: qty 20

## 2016-08-10 MED ORDER — PROPOFOL 500 MG/50ML IV EMUL
INTRAVENOUS | Status: AC
  Filled 2016-08-10: qty 50

## 2016-08-10 MED ORDER — PROPOFOL 500 MG/50ML IV EMUL
INTRAVENOUS | Status: DC | PRN
Start: 2016-08-10 — End: 2016-08-10
  Administered 2016-08-10: 125 ug/kg/min via INTRAVENOUS

## 2016-08-10 MED ORDER — SODIUM CHLORIDE 0.9 % IV SOLN
INTRAVENOUS | Status: DC
  Administered 2016-08-10 (×3): via INTRAVENOUS

## 2016-08-10 MED ORDER — PROPOFOL 10 MG/ML IV BOLUS
INTRAVENOUS | Status: DC | PRN
  Administered 2016-08-10: 50 mg via INTRAVENOUS

## 2016-08-10 NOTE — Anesthesia Postprocedure Evaluation (Signed)
Anesthesia Post Note  Patient: Lindsey Harmon  Procedure(s) Performed: Procedure(s) (LRB): COLONOSCOPY WITH PROPOFOL (N/A)  Patient location during evaluation: PACU Anesthesia Type: General Level of consciousness: awake and alert and oriented Pain management: pain level controlled Vital Signs Assessment: post-procedure vital signs reviewed and stable Respiratory status: spontaneous breathing Cardiovascular status: blood pressure returned to baseline Anesthetic complications: no     Last Vitals:  Vitals:   08/10/16 1708 08/10/16 1718  BP:  (!) 126/48  Pulse:    Resp: 16   Temp:      Last Pain:  Vitals:   08/10/16 1400  TempSrc: Tympanic                 Katyra Tomassetti

## 2016-08-10 NOTE — Anesthesia Procedure Notes (Signed)
Date/Time: 08/10/2016 4:20 PM Performed by: Nelda Marseille Pre-anesthesia Checklist: Patient identified, Emergency Drugs available, Suction available, Patient being monitored and Timeout performed Oxygen Delivery Method: Nasal cannula

## 2016-08-10 NOTE — Anesthesia Post-op Follow-up Note (Cosign Needed)
Anesthesia QCDR form completed.        

## 2016-08-10 NOTE — Op Note (Signed)
[facility] Gastroenterology Patient Name: Lindsey Harmon Procedure Date: 08/10/2016 3:09 PM MRN: [id number] Account #: [id number] Date of Birth: 1945-07-12 Admit Type: Outpatient Age: 71 Room: [facility] ENDO ROOM 3 Gender: Female Note Status: Finalized Procedure:            Colonoscopy Indications:          Positive Cologuard test Providers:            Lollie Sails, MD Referring MD:         Einar Pheasant, MD (Referring MD) Medicines:            Monitored Anesthesia Care Complications:        No immediate complications. Procedure:            Pre-Anesthesia Assessment:                       - ASA Grade Assessment: III - A patient with severe                        systemic disease.                       After obtaining informed consent, the colonoscope was                        passed under direct vision. Throughout the procedure,                        the patient's blood pressure, pulse, and oxygen                        saturations were monitored continuously. The Olympus                        PCF-H180AL colonoscope ( S#: [id number] ) was introduced                        through the anus and advanced to the the cecum,                        identified by appendiceal orifice and ileocecal valve.                        The colonoscopy was performed with moderate difficulty.                        The patient tolerated the procedure well. Findings:      Multiple small and large-mouthed diverticula were found in the sigmoid       colon and descending colon.      Two sessile polyps were found in the proximal sigmoid colon. The polyps       were 2 to 3 mm in size. These polyps were removed with a cold biopsy       forceps. Resection and retrieval were complete.      The sigmoid colon was significantly redundant.      A 50 mm polyp was found in the distal descending colon. The polyp was       carpet-like and multi-lobulated. Biopsies were taken with a cold forceps      for histology. This lesion was also marked with tattoo ink, and  a second       biopsy was taken from the center of the lesion, placed in a separate       jar, marked center of lesion/distal descending tattooed lesion.      A 4 mm polyp was found in the cecum. The polyp was sessile. The polyp       was removed with a cold snare. Resection and retrieval were complete.      Two flat polyps were found in the cecum. The polyps were 1 to 2 mm in       size. These polyps were removed with a cold biopsy forceps. Resection       and retrieval were complete.      A 16 mm polyp was found in the cecum. The polyp was sessile. The polyp       was removed with a cold snare. The polyp was removed with a saline       injection-lift technique using a cold snare. The polyp was removed with       a piecemeal technique using a cold snare. Resection and retrieval were       complete. To prevent bleeding after the polypectomy, two hemostatic       clips were successfully placed (MR conditional). There was no bleeding       at the end of the procedure.      A 3 mm polyp was found in the proximal ascending colon. The polyp was       sessile. The polyp was removed with a cold biopsy forceps. Resection and       retrieval were complete.      A 8 mm polyp was found in the proximal ascending colon. The polyp was       sessile. The polyp was removed with a cold snare. Resection and       retrieval were complete.      A 3 mm polyp was found in the proximal transverse colon. The polyp was       flat. The polyp was removed with a cold biopsy forceps. Resection and       retrieval were complete.      A 4 mm polyp was found in the rectum. The polyp was sessile. The polyp       was removed with a cold snare. Resection and retrieval were complete.      The retroflexed view of the distal rectum and anal verge was normal and       showed no anal or rectal abnormalities.      The digital rectal exam was normal. Impression:            - Diverticulosis in the sigmoid colon and in the                        descending colon.                       - Two 2 to 3 mm polyps in the proximal sigmoid colon,                        removed with a cold biopsy forceps. Resected and                        retrieved.                       -  Redundant colon.                       - One 50 mm polyp in the distal descending colon.                        Biopsied.                       - One 4 mm polyp in the cecum, removed with a cold                        snare. Resected and retrieved.                       - Two 1 to 2 mm polyps in the cecum, removed with a                        cold biopsy forceps. Resected and retrieved.                       - One 16 mm polyp in the cecum, removed with a cold                        snare, removed using injection-lift and a cold snare                        and removed piecemeal using a cold snare. Resected and                        retrieved. Clips (MR conditional) were placed.                       - One 3 mm polyp in the proximal ascending colon,                        removed with a cold biopsy forceps. Resected and                        retrieved.                       - One 8 mm polyp in the proximal ascending colon,                        removed with a cold snare. Resected and retrieved.                       - One 3 mm polyp in the proximal transverse colon,                        removed with a cold biopsy forceps. Resected and                        retrieved.                       - One 4 mm polyp in the rectum, removed with a cold  snare. Resected and retrieved.                       - The distal rectum and anal verge are normal on                        retroflexion view. Recommendation:       - Discharge patient to home.                       - Clear liquid diet today.                       - Full liquid diet for 1 day, then advance as tolerated                         to soft diet. Procedure Code(s):    --- Professional ---                       [phone], Colonoscopy, flexible; with removal of tumor(s),                        polyp(s), or other lesion(s) by snare technique                       45380, 59, Colonoscopy, flexible; with biopsy, single                        or multiple                       45381, Colonoscopy, flexible; with directed submucosal                        injection(s), any substance Diagnosis Code(s):    --- Professional ---                       D12.5, Benign neoplasm of sigmoid colon                       D12.0, Benign neoplasm of cecum                       D12.4, Benign neoplasm of descending colon                       D12.2, Benign neoplasm of ascending colon                       D12.3, Benign neoplasm of transverse colon (hepatic                        flexure or splenic flexure)                       K62.1, Rectal polyp                       R19.5, Other fecal abnormalities                       K57.30, Diverticulosis of large intestine without  perforation or abscess without bleeding                       Q43.8, Other specified congenital malformations of                        intestine CPT copyright 2016 American Medical Association. All rights reserved. The codes documented in this report are preliminary and upon coder review may  be revised to meet current compliance requirements. Lollie Sails, MD 08/10/2016 4:58:11 PM This report has been signed electronically. Number of Addenda: 0 Note Initiated On: 08/10/2016 3:09 PM Scope Withdrawal Time: 0 hours 49 minutes 55 seconds  Total Procedure Duration: 1 hour 9 minutes 56 seconds       [facility]

## 2016-08-10 NOTE — H&P (Signed)
Outpatient short stay form Pre-procedure 08/10/2016 2:33 PM Lollie Sails MD  Primary Physician: Dr. Einar Pheasant  Reason for visit:  Colonoscopy  History of present illness:  Patient is a 71 year old female presenting today for her initial colonoscopy. He tolerated her prep well. She did have a little bit of nausea but was clear at the end. He takes no aspirin or blood thinning agents. She did have a positive cologard test. She has had multiple pelvic instrumentations from a GYN standpoint.   Current Facility-Administered Medications:  .  0.9 %  sodium chloride infusion, , Intravenous, Continuous, Lollie Sails, MD, Last Rate: 20 mL/hr at 08/10/16 1417  Prescriptions Prior to Admission  Medication Sig Dispense Refill Last Dose  . bisoprolol (ZEBETA) 5 MG tablet TAKE 1 TABLET BY MOUTH DAILY. 90 tablet 2 08/10/2016 at 0600  . Calcium Carbonate-Vitamin D (CALTRATE 600+D PO) Take by mouth.   Past Week at Unknown time  . cephALEXin (KEFLEX) 250 MG capsule Take 250 mg by mouth daily.    08/09/2016 at Unknown time  . fluconazole (DIFLUCAN) 150 MG tablet TAKE 1 TABLET (150 MG TOTAL) BY MOUTH ONCE FOR 1 DOSE. MAY REPEAT TABLET IN 1 WEEK  3 08/09/2016 at Unknown time  . hydrochlorothiazide (HYDRODIURIL) 25 MG tablet TAKE 1 TABLET BY MOUTH DAILY 90 tablet 1 08/10/2016 at 0600  . losartan (COZAAR) 100 MG tablet TAKE 1 TABLET (100 MG TOTAL) BY MOUTH DAILY. 90 tablet 1 08/10/2016 at 0600  . Omega-3 Fatty Acids (FISH OIL) 1200 MG CAPS Take by mouth daily.   Past Week at Unknown time  . omeprazole (PRILOSEC) 20 MG capsule TAKE 1 CAPSULE (20 MG TOTAL) BY MOUTH 2 (TWO) TIMES DAILY. 180 capsule 1 08/09/2016 at Unknown time  . OXYQUINOLONE SULFATE VAGINAL (TRIMO-SAN) 0.025 % GEL Place vaginally 2 (two) times a week.   08/09/2016 at Unknown time  . sertraline (ZOLOFT) 50 MG tablet TAKE 1 AND 1/2 TABLETS EVERY DAY 135 tablet 1 08/09/2016 at Unknown time  . simvastatin (ZOCOR) 20 MG tablet TAKE 1 TABLET (20 MG  TOTAL) BY MOUTH DAILY. 90 tablet 1 08/09/2016 at Unknown time     Allergies  Allergen Reactions  . Ace Inhibitors Cough    cough     Past Medical History:  Diagnosis Date  . Abnormal liver function   . Cystocele, unspecified (CODE)   . Fibrocystic breast disease   . GERD (gastroesophageal reflux disease)   . Hypertension   . Intrinsic sphincter deficiency   . Pelvic relaxation   . Pure hypercholesterolemia   . Rectocele   . SUI (stress urinary incontinence, female)   . Urinary retention   . Urinary retention     Review of systems:      Physical Exam    Heart and lungs: Regular rate and rhythm without rub or gallop, lungs are bilaterally clear.    HEENT: Normocephalic atraumatic eyes are anicteric    Other:     Pertinant exam for procedure: Soft nontender nondistended bowel sounds positive normoactive.    Planned proceedures: Colonoscopy and indicated procedures. I have discussed the risks benefits and complications of procedures to include not limited to bleeding, infection, perforation and the risk of sedation and the patient wishes to proceed.    Lollie Sails, MD Gastroenterology 08/10/2016  2:33 PM

## 2016-08-10 NOTE — Anesthesia Preprocedure Evaluation (Signed)
Anesthesia Evaluation  Patient identified by MRN, date of birth, ID band Patient awake    Reviewed: Allergy & Precautions, NPO status , Patient's Chart, lab work & pertinent test results, reviewed documented beta blocker date and time   Airway Mallampati: II       Dental no notable dental hx.    Pulmonary neg pulmonary ROS,    Pulmonary exam normal        Cardiovascular hypertension, Pt. on medications and Pt. on home beta blockers Normal cardiovascular exam     Neuro/Psych negative neurological ROS     GI/Hepatic Neg liver ROS, GERD  Medicated and Controlled,  Endo/Other  negative endocrine ROS  Renal/GU Renal disease  negative genitourinary   Musculoskeletal negative musculoskeletal ROS (+)   Abdominal Normal abdominal exam  (+)   Peds negative pediatric ROS (+)  Hematology negative hematology ROS (+)   Anesthesia Other Findings   Reproductive/Obstetrics                             Anesthesia Physical Anesthesia Plan  ASA: III  Anesthesia Plan: General   Post-op Pain Management:    Induction: Intravenous  Airway Management Planned: Nasal Cannula  Additional Equipment:   Intra-op Plan:   Post-operative Plan:   Informed Consent: I have reviewed the patients History and Physical, chart, labs and discussed the procedure including the risks, benefits and alternatives for the proposed anesthesia with the patient or authorized representative who has indicated his/her understanding and acceptance.     Plan Discussed with: CRNA and Surgeon  Anesthesia Plan Comments:         Anesthesia Quick Evaluation

## 2016-08-10 NOTE — Transfer of Care (Signed)
Immediate Anesthesia Transfer of Care Note  Patient: Lindsey Harmon  Procedure(s) Performed: Procedure(s): COLONOSCOPY WITH PROPOFOL (N/A)  Patient Location: PACU  Anesthesia Type:General  Level of Consciousness: sedated  Airway & Oxygen Therapy: Patient Spontanous Breathing and Patient connected to nasal cannula oxygen  Post-op Assessment: Report given to RN and Post -op Vital signs reviewed and stable  Post vital signs: Reviewed and stable  Last Vitals:  Vitals:   08/10/16 1400 08/10/16 1648  BP: 132/74 (!) 111/53  Pulse: 66   Resp: 18   Temp: (!) 35.6 C     Last Pain:  Vitals:   08/10/16 1400  TempSrc: Tympanic         Complications: No apparent anesthesia complications

## 2016-08-12 ENCOUNTER — Encounter: Payer: Self-pay | Admitting: Gastroenterology

## 2016-08-12 LAB — SURGICAL PATHOLOGY

## 2016-08-16 ENCOUNTER — Encounter: Payer: Self-pay | Admitting: Internal Medicine

## 2016-08-16 DIAGNOSIS — Z86010 Personal history of colonic polyps: Secondary | ICD-10-CM | POA: Insufficient documentation

## 2016-09-01 ENCOUNTER — Encounter: Payer: Self-pay | Admitting: *Deleted

## 2016-09-06 ENCOUNTER — Encounter: Payer: Self-pay | Admitting: General Surgery

## 2016-09-06 ENCOUNTER — Ambulatory Visit (INDEPENDENT_AMBULATORY_CARE_PROVIDER_SITE_OTHER): Payer: Medicare Other | Admitting: General Surgery

## 2016-09-06 VITALS — BP 142/78 | HR 68 | Resp 15 | Ht 64.0 in | Wt 182.0 lb

## 2016-09-06 DIAGNOSIS — Z86010 Personal history of colonic polyps: Secondary | ICD-10-CM | POA: Diagnosis not present

## 2016-09-06 NOTE — Progress Notes (Signed)
Patient ID: BRITANI BEATTIE, female   DOB: Jul 17, 1945, 71 y.o.   MRN: [id number]  Chief Complaint  Patient presents with  . Other    colon polyp    HPI Lindsey Harmon is a 71 y.o. female here today following up from a colonoscopy, which was done on 08/10/2016. Patient states she moves her bowels daily. This was her first colonoscopy.  Here today with her husband Tarri Fuller is present at visit.   HPI  Past Medical History:  Diagnosis Date  . Abnormal liver function   . Cystocele, unspecified (CODE)   . Fibrocystic breast disease   . GERD (gastroesophageal reflux disease)   . Hypertension   . Intrinsic sphincter deficiency   . Pelvic relaxation   . Pure hypercholesterolemia   . Rectocele   . SUI (stress urinary incontinence, female)   . Urinary retention   . Urinary retention     Past Surgical History:  Procedure Laterality Date  . ABDOMINAL HYSTERECTOMY    . APPENDECTOMY    . Bladder Tack    . burch urethropexy  10/24/2000   laparoscopic colpopexy/culdoplasty  . COLONOSCOPY WITH PROPOFOL N/A 08/10/2016   Procedure: COLONOSCOPY WITH PROPOFOL;  Surgeon: Lollie Sails, MD;  Location: [facility] ENDOSCOPY;  Service: Endoscopy;  Laterality: N/A;  . COLPORRHAPHY     forv repair cystocele anterior  . TRANSVAGINAL TAPE (TVT) REMOVAL      Family History  Problem Relation Age of Onset  . Pneumonia Mother     died of presumed aspiration pneumonia  . Hypertension Mother   . Cancer Father     prostate  . Heart disease Paternal Grandfather     myocardial infarction    Social History Social History  Substance Use Topics  . Smoking status: Never Smoker  . Smokeless tobacco: Never Used  . Alcohol use No    Allergies  Allergen Reactions  . Ace Inhibitors Cough    cough    Current Outpatient Prescriptions  Medication Sig Dispense Refill  . bisoprolol (ZEBETA) 5 MG tablet TAKE 1 TABLET BY MOUTH DAILY. 90 tablet 2  . Calcium Carbonate-Vitamin D (CALTRATE 600+D PO) Take by  mouth.    . cephALEXin (KEFLEX) 250 MG capsule Take 250 mg by mouth daily.     . hydrochlorothiazide (HYDRODIURIL) 25 MG tablet TAKE 1 TABLET BY MOUTH DAILY 90 tablet 1  . losartan (COZAAR) 100 MG tablet TAKE 1 TABLET (100 MG TOTAL) BY MOUTH DAILY. 90 tablet 1  . Omega-3 Fatty Acids (FISH OIL) 1200 MG CAPS Take by mouth daily.    Marland Kitchen omeprazole (PRILOSEC) 20 MG capsule TAKE 1 CAPSULE (20 MG TOTAL) BY MOUTH 2 (TWO) TIMES DAILY. 180 capsule 1  . sertraline (ZOLOFT) 50 MG tablet TAKE 1 AND 1/2 TABLETS EVERY DAY 135 tablet 1  . simvastatin (ZOCOR) 20 MG tablet TAKE 1 TABLET (20 MG TOTAL) BY MOUTH DAILY. 90 tablet 1   No current facility-administered medications for this visit.     Review of Systems Review of Systems  Constitutional: Negative.   Respiratory: Negative.   Cardiovascular: Negative.   Gastrointestinal: Negative.     Blood pressure (!) 142/78, pulse 68, resp. rate 15, height 5\' 4"  (1.626 m), weight 182 lb (82.6 kg).  Physical Exam Physical Exam  Constitutional: She is oriented to person, place, and time. She appears well-developed and well-nourished.  Eyes: Conjunctivae are normal. No scleral icterus.  Neck: Neck supple.  Cardiovascular: Normal rate and regular rhythm.   Pulses:  Femoral pulses are 2+ on the right side, and 2+ on the left side.      Dorsalis pedis pulses are 2+ on the right side, and 2+ on the left side.       Posterior tibial pulses are 2+ on the right side, and 2+ on the left side.  Pulmonary/Chest: Effort normal and breath sounds normal.  Abdominal: Soft. Normal appearance and bowel sounds are normal. There is no hepatomegaly. There is no tenderness. No hernia.  Lymphadenopathy:    She has no cervical adenopathy.  Neurological: She is alert and oriented to person, place, and time.  Skin: Skin is warm and dry.    Data Reviewed DIAGNOSIS:  A. COLON POLYPS X 2, PROXIMAL SIGMOID; COLD BIOPSY:  - TUBULAR ADENOMAS, 5 FRAGMENTS.  - NEGATIVE FOR  HIGH-GRADE DYSPLASIA AND MALIGNANCY.   B. COLON POLYP, DISTAL DESCENDING; COLD BIOPSY:  - VILLOUS ADENOMA, 2 FRAGMENTS.  - NEGATIVE FOR HIGH-GRADE DYSPLASIA AND MALIGNANCY.  I. COLON POLYP MEASURING 5 CM, DISTAL DESCENDING; COLD BIOPSY FROM  CENTER OF LESION:  - VILLOUS ADENOMA.  - NEGATIVE FOR HIGH-GRADE DYSPLASIA AND MALIGNANCY.   C. COLON POLYP, CECUM; COLD SNARE:  - TUBULAR ADENOMA.  - NEGATIVE FOR HIGH-GRADE DYSPLASIA AND MALIGNANCY.   D. COLON POLYP X 2, CECUM; COLD BIOPSY:  - TUBULAR ADENOMAS, 2 FRAGMENTS.  - NEGATIVE FOR HIGH-GRADE DYSPLASIA AND MALIGNANCY.   E. COLON POLYP, CECUM; COLD BIOPSY:  - TUBULOVILLOUS ADENOMA, MULTIPLE FRAGMENTS.  - NEGATIVE FOR HIGH-GRADE DYSPLASIA AND MALIGNANCY.   F. COLON POLYP, PROXIMAL ASCENDING; COLD BIOPSY:  - TUBULAR ADENOMA.  - NEGATIVE FOR HIGH-GRADE DYSPLASIA AND MALIGNANCY.   G. COLON, PROXIMAL ASCENDING; COLD SNARE:  - TUBULAR ADENOMA, 3 FRAGMENTS.  - NEGATIVE FOR HIGH-GRADE DYSPLASIA AND MALIGNANCY.   H. COLON POLYP, PROXIMAL TRANSVERSE; COLD BIOPSY:  - TUBULAR ADENOMA.  - NEGATIVE FOR HIGH-GRADE DYSPLASIA AND MALIGNANCY.   J. COLON POLYP, RECTOSIGMOID JUNCTION; COLD SNARE:  - HYPERPLASTIC POLYP, 2 FRAGMENTS.  - NEGATIVE FORDYSPLASIA AND MALIGNANCY.   Colonoscopy report of 08/10/2016 reviewed.  Laboratory studies of 05/18/2016 reviewed.  Hemoglobin 12.7 with an MCV of 85. Modest decrease from an MCV of 91 3 years earlier. Hemoglobin stable. Platelet count 224,000.  Basic metabolic panel was unremarkable. Creatinine of 0.95 with an estimated GFR of 61. Stable over the last year.  Liver function panel of the same date unremarkable.  Assessment    Multiple colonic polyps on first exam, large polyp in the sigmoid colon.    Plan    Options for management were reviewed in regards to the large polyp described in the sigmoid colon: 1) surgical resection versus 2) attempted endoscopic removal.  Pros and cons of each  were reviewed. Sigmoid resection would remove this area in its entirety, but does carry the risks associated with major surgery. Without any atypia noted on the biopsies completed by the endoscopist, it is likely that this is indeed a benign lesion and if it were to be completely removed endoscopically the patient would be placed in the same surveillance pattern (1 year follow-up from now) as she would if she had a colon resection. The downside of this as if it could not be entirely resected or if atypia/malignancy was identified on the removal of complete lesion the patient would be a candidate for sigmoid resection at that time.  We spent about 45 minutes going over the pros and cons of each approach. She'll consider her options and notify the office of how she  would like to proceed.     HPI, Physical Exam, Assessment and Plan have been scribed under the direction and in the presence of Hervey Ard, MD.  Gaspar Cola, CMA  I have completed the exam and reviewed the above documentation for accuracy and completeness.  I agree with the above.  Haematologist has been used and any errors in dictation or transcription are unintentional.  Hervey Ard, M.D., F.A.C.S.  Robert Bellow 09/07/2016, 4:43 PM

## 2016-09-09 ENCOUNTER — Telehealth: Payer: Self-pay | Admitting: *Deleted

## 2016-09-09 ENCOUNTER — Telehealth: Payer: Self-pay | Admitting: General Surgery

## 2016-09-09 MED ORDER — POLYETHYLENE GLYCOL 3350 17 GM/SCOOP PO POWD: ORAL | 0 refills | Status: DC

## 2016-09-09 NOTE — Telephone Encounter (Signed)
Patient has been scheduled for a colonoscopy/polypectomy on 10-20-16 at [facility]. Miralax prescription has been sent in to the patient's pharmacy today. Colonoscopy instructions have been reviewed with the patient. This patient is aware to call the office if they have further questions.

## 2016-09-09 NOTE — Telephone Encounter (Signed)
Spoke with the patient regarding her goals. At present she would like to proceed with repeat colonoscopy and attempted complete polypectomy. We reviewed the down side: 1) potential identification of unrecognized malignancy versus 2) inability to completely remove the polyp versus 3) colonic injury as opposed to proceeding directly to colon resection with all its associated risks.  She like to do this in June after her upcoming high school reunion.

## 2016-09-09 NOTE — Telephone Encounter (Signed)
SHE CAME IN THE OFFICE TO LET DR Bary Castilla KNOW WHICH PROCEDURE SHE HAS DECIDED ON.SHE WOULD LIKE THE LEAST INVASIVE PROCEDURE THAT DOES NOT INVOLVE CUTTING THE COLON TO REMOVE THE POLYP.

## 2016-09-13 NOTE — Progress Notes (Signed)
Thank you :)

## 2016-09-17 ENCOUNTER — Other Ambulatory Visit: Payer: Self-pay | Admitting: Internal Medicine

## 2016-09-21 ENCOUNTER — Ambulatory Visit (INDEPENDENT_AMBULATORY_CARE_PROVIDER_SITE_OTHER): Payer: Medicare Other

## 2016-09-21 VITALS — BP 122/70 | HR 58 | Temp 98.4°F | Resp 14 | Ht 64.0 in | Wt 184.1 lb

## 2016-09-21 DIAGNOSIS — Z0000 Encounter for general adult medical examination without abnormal findings: Secondary | ICD-10-CM

## 2016-09-21 NOTE — Patient Instructions (Addendum)
Lindsey Harmon , Thank you for taking time to come for your Medicare Wellness Visit. I appreciate your ongoing commitment to your health goals. Please review the following plan we discussed and let me know if I can assist you in the future.   Follow up with Dr. Nicki Reaper as needed.    Consider Bone Density (Dexa Scan).  Educational material provided.  Consider Prevnar 13 vaccine.  Educational material provided.  Consider Hepatitis C screening.  Educational material provided.  Bring a copy of your [place] and/or Living Will to be scanned into chart.  Have a great day!  These are the goals we discussed: Goals    . Increase physical activity          Standing/chair and stretch exercises 10 minutes daily.   Use the treadmill 15 minutes 3 times weekly, as tolerated.    . Increase water intake          Stay hydrated.    . Low carb foods       This is a list of the screening recommended for you and due dates:  Health Maintenance  Topic Date Due  .  Hepatitis C: One time screening is recommended by Center for Disease Control  (CDC) for  adults born from [birth year] through 1967.   08/14/1945  . Tetanus Vaccine  05/21/1962  . DEXA scan (bone density measurement)  05/21/2008  . Pneumonia vaccines (1 of 2 - PCV13) 05/21/2008  . Flu Shot  12/15/2016  . Mammogram  05/26/2018  . Colon Cancer Screening  08/11/2026    Bone Densitometry Bone densitometry is an imaging test that uses a special X-ray to measure the amount of calcium and other minerals in your bones (bone density). This test is also known as a bone mineral density test or dual-energy X-ray absorptiometry (DXA). The test can measure bone density at your hip and your spine. It is similar to having a regular X-ray. You may have this test to:  Diagnose a condition that causes weak or thin bones (osteoporosis).  Predict your risk of a broken bone (fracture).  Determine how well osteoporosis treatment is working. Tell  a health care provider about:  Any allergies you have.  All medicines you are taking, including vitamins, herbs, eye drops, creams, and over-the-counter medicines.  Any problems you or family members have had with anesthetic medicines.  Any blood disorders you have.  Any surgeries you have had.  Any medical conditions you have.  Possibility of pregnancy.  Any other medical test you had within the previous 14 days that used contrast material. What are the risks? Generally, this is a safe procedure. However, problems can occur and may include the following:  This test exposes you to a very small amount of radiation.  The risks of radiation exposure may be greater to unborn children. What happens before the procedure?  Do not take any calcium supplements for 24 hours before having the test. You can otherwise eat and drink what you usually do.  Take off all metal jewelry, eyeglasses, dental appliances, and any other metal objects. What happens during the procedure?  You may lie on an exam table. There will be an X-ray generator below you and an imaging device above you.  Other devices, such as boxes or braces, may be used to position your body properly for the scan.  You will need to lie still while the machine slowly scans your body.  The images will show up  on a computer monitor. What happens after the procedure? You may need more testing at a later time. This information is not intended to replace advice given to you by your health care provider. Make sure you discuss any questions you have with your health care provider. Document Released: 05/25/2004 Document Revised: 10/09/2015 Document Reviewed: 10/11/2013 Elsevier Interactive Patient Education  2017 [employer].

## 2016-09-21 NOTE — Progress Notes (Signed)
Subjective:   AMAYRANI BENNICK is a 71 y.o. female who presents for an Initial Medicare Annual Wellness Visit.  Review of Systems    No ROS.  Medicare Wellness Visit.  Cardiac Risk Factors include: advanced age (>55men, >63 women);obesity (BMI >30kg/m2);hypertension     Objective:    Today's Vitals   09/21/16 1008  BP: 122/70  Pulse: (!) 58  Resp: 14  Temp: 98.4 F (36.9 C)  TempSrc: Oral  SpO2: 97%  Weight: 184 lb 1.9 oz (83.5 kg)  Height: 5\' 4"  (1.626 m)   Body mass index is 31.6 kg/m.   Current Medications (verified) Outpatient Encounter Prescriptions as of 09/21/2016  Medication Sig  . bisoprolol (ZEBETA) 5 MG tablet TAKE 1 TABLET BY MOUTH DAILY.  . Calcium Carbonate-Vitamin D (CALTRATE 600+D PO) Take by mouth.  . cephALEXin (KEFLEX) 250 MG capsule Take 250 mg by mouth daily.   . Estradiol 1 MG/GM GEL Place onto the skin 2 (two) times daily.  . hydrochlorothiazide (HYDRODIURIL) 25 MG tablet TAKE 1 TABLET BY MOUTH DAILY  . losartan (COZAAR) 100 MG tablet TAKE 1 TABLET (100 MG TOTAL) BY MOUTH DAILY.  Marland Kitchen Omega-3 Fatty Acids (FISH OIL) 1200 MG CAPS Take by mouth daily.  Marland Kitchen omeprazole (PRILOSEC) 20 MG capsule TAKE 1 CAPSULE (20 MG TOTAL) BY MOUTH 2 (TWO) TIMES DAILY.  Marland Kitchen polyethylene glycol powder (GLYCOLAX/MIRALAX) powder 255 grams one bottle for colonoscopy prep  . sertraline (ZOLOFT) 50 MG tablet TAKE 1 AND 1/2 TABLETS EVERY DAY  . simvastatin (ZOCOR) 20 MG tablet TAKE 1 TABLET (20 MG TOTAL) BY MOUTH DAILY.   No facility-administered encounter medications on file as of 09/21/2016.     Allergies (verified) Ace inhibitors   History: Past Medical History:  Diagnosis Date  . Abnormal liver function   . Cystocele, unspecified (CODE)   . Fibrocystic breast disease   . GERD (gastroesophageal reflux disease)   . Hypertension   . Intrinsic sphincter deficiency   . Pelvic relaxation   . Pure hypercholesterolemia   . Rectocele   . SUI (stress urinary incontinence,  female)   . Urinary retention   . Urinary retention    Past Surgical History:  Procedure Laterality Date  . ABDOMINAL HYSTERECTOMY    . APPENDECTOMY    . Bladder Tack    . burch urethropexy  10/24/2000   laparoscopic colpopexy/culdoplasty  . COLONOSCOPY WITH PROPOFOL N/A 08/10/2016   Procedure: COLONOSCOPY WITH PROPOFOL;  Surgeon: Lollie Sails, MD;  Location: [facility] ENDOSCOPY;  Service: Endoscopy;  Laterality: N/A;  . COLPORRHAPHY     forv repair cystocele anterior  . TRANSVAGINAL TAPE (TVT) REMOVAL     Family History  Problem Relation Age of Onset  . Pneumonia Mother     died of presumed aspiration pneumonia  . Hypertension Mother   . Cancer Father     prostate  . Heart disease Paternal Grandfather     myocardial infarction   Social History   Occupational History  . Not on file.   Social History Main Topics  . Smoking status: Never Smoker  . Smokeless tobacco: Never Used  . Alcohol use No  . Drug use: No  . Sexual activity: Not on file    Tobacco Counseling Counseling given: Not Answered   Activities of Daily Living In your present state of health, do you have any difficulty performing the following activities: 09/21/2016  Hearing? N  Vision? N  Difficulty concentrating or making decisions? N  Walking or  climbing stairs? N  Dressing or bathing? N  Doing errands, shopping? N  Preparing Food and eating ? N  Using the Toilet? N  In the past six months, have you accidently leaked urine? Y  Do you have problems with loss of bowel control? N  Managing your Medications? N  Managing your Finances? N  Housekeeping or managing your Housekeeping? N  Some recent data might be hidden    Immunizations and Health Maintenance Immunization History  Administered Date(s) Administered  . Influenza, High Dose Seasonal PF 02/17/2016   Health Maintenance Due  Topic Date Due  . Hepatitis C Screening  [birth year]  . TETANUS/TDAP  05/21/1962  . DEXA SCAN  05/21/2008  .  PNA vac Low Risk Adult (1 of 2 - PCV13) 05/21/2008    Patient Care Team: Einar Pheasant, MD as PCP - General (Internal Medicine)  Indicate any recent Medical Services you may have received from other than [facility] providers in the past year (date may be approximate).     Assessment:   This is a routine wellness examination for Viana. The goal of the wellness visit is to assist the patient how to close the gaps in care and create a preventative care plan for the patient.   Taking calcium VIT D as appropriate/Osteoporosis risk reviewed.  Medications reviewed; taking without issues or barriers.  Safety issues reviewed; smoke detectors in the home. No firearms in the home.  Wears seatbelts when driving or riding with others. Patient does wear sunscreen or protective clothing when in direct sunlight. No violence in the home.  Depression- PHQ 2 &9 complete.  No signs/symptoms or verbal communication regarding little pleasure in doing things, feeling down, depressed or hopeless. No changes in sleeping, energy, eating, concentrating.  No thoughts of self harm or harm towards others.  Time spent on this topic is 8 minutes.   Patient is alert, normal appearance, oriented to person/place/and time. Correctly identified the president of the [place], recall of 3/3 words, and performing simple calculations.  Patient displays appropriate judgement and can read correct time from watch face.  No new identified risk were noted.  No failures at ADL's or IADL's.   BMI- discussed the importance of a healthy diet, water intake and exercise. Educational material provided.   24 hour diet recall: Breakfast: 2 piece french toast Dinner: [employer] chicken sandwich  Daily fluid intake: 2 cups of caffeine, 4 cups of water  HTN- followed by PCP.  Dental- every six months.  Dr. Nicoletta Dress.   Sleep patterns- Sleeps 6-7 hours at night.  Wakes feeling rested.  Naps  Dexa Scan discussed, educational material  provided.  Prevnar 13 and TDAP vaccine discussed , educational material provided.  Hepatitis C Screening discussed, educational material provided.  Patient Concerns: No new patient concerns.  Follow up with PCP as needed.  Hearing/Vision screen Hearing Screening Comments: Patient is able to hear conversational tones without difficulty.  No issues reported.  Vision Screening Comments: Followed by Dr. Ellin Mayhew Phillip Heal) Wears corrective lenses when reading and driving Last OV 7169 Visual acuity not assessed per patient preference since they have regular follow up with the ophthalmologist  Dietary issues and exercise activities discussed: Current Exercise Habits: Home exercise routine, Type of exercise: stretching, Time (Minutes): 10, Frequency (Times/Week): 1, Weekly Exercise (Minutes/Week): 10, Intensity: Mild  Goals    . Increase physical activity          Standing/chair and stretch exercises 10 minutes daily.   Use the treadmill  15 minutes 3 times weekly, as tolerated.    . Increase water intake          Stay hydrated.    . Low carb foods      Depression Screen PHQ 2/9 Scores 09/21/2016 05/23/2015 02/26/2014 01/30/2013 08/07/2012  PHQ - 2 Score 0 0 0 0 0  PHQ- 9 Score 0 - - - -    Fall Risk Fall Risk  09/21/2016 05/23/2015 02/26/2014 01/30/2013 08/07/2012  Falls in the past year? No No Yes No No  Number falls in past yr: - - 1 - -  Injury with Fall? - - Yes - -    Cognitive Function: MMSE - Mini Mental State Exam 09/21/2016  Orientation to time 5  Orientation to Place 5  Registration 3  Attention/ Calculation 5  Recall 3  Language- name 2 objects 2  Language- repeat 1  Language- follow 3 step command 3  Language- read & follow direction 1  Write a sentence 1  Copy design 1  Total score 30        Screening Tests Health Maintenance  Topic Date Due  . Hepatitis C Screening  [birth year]  . TETANUS/TDAP  05/21/1962  . DEXA SCAN  05/21/2008  . PNA vac Low Risk Adult (1  of 2 - PCV13) 05/21/2008  . INFLUENZA VACCINE  12/15/2016  . MAMMOGRAM  05/26/2018  . COLONOSCOPY  08/11/2026      Plan:    End of life planning; Advance aging; Advanced directives discussed. Copy of current HCPOA/Living Will requested.    I have personally reviewed and noted the following in the patient's chart:   . Medical and social history . Use of alcohol, tobacco or illicit drugs  . Current medications and supplements . Functional ability and status . Nutritional status . Physical activity . Advanced directives . List of other physicians . Hospitalizations, surgeries, and ER visits in previous 12 months . Vitals . Screenings to include cognitive, depression, and falls . Referrals and appointments  In addition, I have reviewed and discussed with patient certain preventive protocols, quality metrics, and best practice recommendations. A written personalized care plan for preventive services as well as general preventive health recommendations were provided to patient.     Varney Biles, LPN   12/22/3252    Reviewed above information.  Agree with plan.  Dr Nicki Reaper

## 2016-10-06 ENCOUNTER — Other Ambulatory Visit: Payer: Self-pay | Admitting: General Surgery

## 2016-10-06 DIAGNOSIS — Z86010 Personal history of colonic polyps: Secondary | ICD-10-CM

## 2016-10-14 ENCOUNTER — Encounter: Payer: Self-pay | Admitting: *Deleted

## 2016-10-20 ENCOUNTER — Ambulatory Visit
Admission: RE | Admit: 2016-10-20 | Discharge: 2016-10-20 | Disposition: A | Discharge status: Home / Self Care | Payer: Medicare Other | Source: Ambulatory Visit | Attending: General Surgery | Admitting: General Surgery

## 2016-10-20 ENCOUNTER — Encounter: Payer: Self-pay | Admitting: *Deleted

## 2016-10-20 ENCOUNTER — Ambulatory Visit: Payer: Medicare Other | Admitting: Anesthesiology

## 2016-10-20 ENCOUNTER — Encounter
Admission: RE | Disposition: A | Discharge status: Home / Self Care | Payer: Self-pay | Source: Ambulatory Visit | Attending: General Surgery

## 2016-10-20 DIAGNOSIS — Z79899 Other long term (current) drug therapy: Secondary | ICD-10-CM | POA: Diagnosis not present

## 2016-10-20 DIAGNOSIS — K219 Gastro-esophageal reflux disease without esophagitis: Secondary | ICD-10-CM | POA: Diagnosis not present

## 2016-10-20 DIAGNOSIS — I10 Essential (primary) hypertension: Secondary | ICD-10-CM | POA: Diagnosis not present

## 2016-10-20 DIAGNOSIS — Z86010 Personal history of colonic polyps: Secondary | ICD-10-CM

## 2016-10-20 DIAGNOSIS — E7800 Pure hypercholesterolemia, unspecified: Secondary | ICD-10-CM | POA: Insufficient documentation

## 2016-10-20 DIAGNOSIS — D124 Benign neoplasm of descending colon: Secondary | ICD-10-CM | POA: Diagnosis not present

## 2016-10-20 DIAGNOSIS — Z90710 Acquired absence of both cervix and uterus: Secondary | ICD-10-CM | POA: Diagnosis not present

## 2016-10-20 DIAGNOSIS — K635 Polyp of colon: Secondary | ICD-10-CM | POA: Diagnosis not present

## 2016-10-20 HISTORY — PX: COLONOSCOPY WITH PROPOFOL: SHX5780

## 2016-10-20 SURGERY — COLONOSCOPY WITH PROPOFOL
Anesthesia: General

## 2016-10-20 MED ORDER — SODIUM CHLORIDE 0.9 % IV SOLN
INTRAVENOUS | Status: DC
  Administered 2016-10-20: 10:00:00 via INTRAVENOUS

## 2016-10-20 MED ORDER — PROPOFOL 10 MG/ML IV BOLUS
INTRAVENOUS | Status: DC | PRN
  Administered 2016-10-20: 90 mg via INTRAVENOUS

## 2016-10-20 MED ORDER — EPHEDRINE SULFATE 50 MG/ML IJ SOLN
INTRAMUSCULAR | Status: AC
  Filled 2016-10-20: qty 1

## 2016-10-20 MED ORDER — PROPOFOL 500 MG/50ML IV EMUL
INTRAVENOUS | Status: DC | PRN
  Administered 2016-10-20: 110 ug/kg/min via INTRAVENOUS

## 2016-10-20 MED ORDER — EPHEDRINE SULFATE 50 MG/ML IJ SOLN
INTRAMUSCULAR | Status: DC | PRN
  Administered 2016-10-20 (×2): 10 mg via INTRAVENOUS

## 2016-10-20 MED ORDER — GLYCOPYRROLATE 0.2 MG/ML IJ SOLN
INTRAMUSCULAR | Status: AC
  Filled 2016-10-20: qty 1

## 2016-10-20 MED ORDER — GLYCOPYRROLATE 0.2 MG/ML IJ SOLN
INTRAMUSCULAR | Status: DC | PRN
  Administered 2016-10-20: 0.2 mg via INTRAVENOUS

## 2016-10-20 MED ORDER — PROPOFOL 500 MG/50ML IV EMUL
INTRAVENOUS | Status: AC
  Filled 2016-10-20: qty 50

## 2016-10-20 NOTE — Anesthesia Post-op Follow-up Note (Cosign Needed)
Anesthesia QCDR form completed.        

## 2016-10-20 NOTE — Anesthesia Preprocedure Evaluation (Signed)
Anesthesia Evaluation  Patient identified by MRN, date of birth, ID band Patient awake    Reviewed: Allergy & Precautions, H&P , NPO status , Patient's Chart, lab work & pertinent test results, reviewed documented beta blocker date and time   Airway Mallampati: II   Neck ROM: full    Dental  (+) Poor Dentition, Teeth Intact   Pulmonary neg pulmonary ROS,    Pulmonary exam normal        Cardiovascular hypertension, negative cardio ROS Normal cardiovascular exam Rhythm:regular Rate:Normal     Neuro/Psych PSYCHIATRIC DISORDERS negative neurological ROS  negative psych ROS   GI/Hepatic negative GI ROS, Neg liver ROS, GERD  Medicated,  Endo/Other  negative endocrine ROS  Renal/GU Renal diseasenegative Renal ROS  negative genitourinary   Musculoskeletal   Abdominal   Peds  Hematology negative hematology ROS (+)   Anesthesia Other Findings Past Medical History: No date: Abnormal liver function No date: Cystocele, unspecified (CODE) No date: Fibrocystic breast disease No date: GERD (gastroesophageal reflux disease) No date: Hypertension No date: Intrinsic sphincter deficiency No date: Pelvic relaxation No date: Pure hypercholesterolemia No date: Rectocele No date: SUI (stress urinary incontinence, female) No date: Urinary retention No date: Urinary retention Past Surgical History: No date: ABDOMINAL HYSTERECTOMY No date: APPENDECTOMY No date: Bladder Tack 10/24/2000: burch urethropexy     Comment: laparoscopic colpopexy/culdoplasty 08/10/2016: COLONOSCOPY WITH PROPOFOL N/A     Comment: Procedure: COLONOSCOPY WITH PROPOFOL;                Surgeon: Lollie Sails, MD;  Location[facility]              ENDOSCOPY;  Service: Endoscopy;  Laterality:               N/A; No date: COLPORRHAPHY     Comment: forv repair cystocele anterior No date: TRANSVAGINAL TAPE (TVT) REMOVAL BMI    Body Mass Index:  31.41 kg/m     Reproductive/Obstetrics negative OB ROS                             Anesthesia Physical Anesthesia Plan  ASA: II  Anesthesia Plan: General   Post-op Pain Management:    Induction:   PONV Risk Score and Plan:   Airway Management Planned:   Additional Equipment:   Intra-op Plan:   Post-operative Plan:   Informed Consent: I have reviewed the patients History and Physical, chart, labs and discussed the procedure including the risks, benefits and alternatives for the proposed anesthesia with the patient or authorized representative who has indicated his/her understanding and acceptance.   Dental Advisory Given  Plan Discussed with: CRNA  Anesthesia Plan Comments:         Anesthesia Quick Evaluation

## 2016-10-20 NOTE — H&P (Signed)
THERISA MENNELLA [id number] 06-Jan-1946     HPI:  71 y/o woman with recent colonoscopy identifying a large polyp in the distal descending colon. Options for management reviewed: 1) proceed directly to colon resection or  2)attempt at endoscopic excision. For attempted endoscopic resection today.  Tolerated prep well.   Prescriptions Prior to Admission  Medication Sig Dispense Refill Last Dose  . bisoprolol (ZEBETA) 5 MG tablet TAKE 1 TABLET BY MOUTH DAILY. 90 tablet 2 10/20/2016 at Unknown time  . Calcium Carbonate-Vitamin D (CALTRATE 600+D PO) Take by mouth.   Past Week at Unknown time  . cephALEXin (KEFLEX) 250 MG capsule Take 250 mg by mouth daily.    10/19/2016 at Unknown time  . hydrochlorothiazide (HYDRODIURIL) 25 MG tablet TAKE 1 TABLET BY MOUTH DAILY 90 tablet 1 10/20/2016 at Unknown time  . losartan (COZAAR) 100 MG tablet TAKE 1 TABLET (100 MG TOTAL) BY MOUTH DAILY. 90 tablet 1 10/20/2016 at Unknown time  . Omega-3 Fatty Acids (FISH OIL) 1200 MG CAPS Take by mouth daily.   Past Week at Unknown time  . omeprazole (PRILOSEC) 20 MG capsule TAKE 1 CAPSULE (20 MG TOTAL) BY MOUTH 2 (TWO) TIMES DAILY. 180 capsule 1 10/19/2016 at Unknown time  . sertraline (ZOLOFT) 50 MG tablet TAKE 1 AND 1/2 TABLETS EVERY DAY 135 tablet 1 10/19/2016 at Unknown time  . simvastatin (ZOCOR) 20 MG tablet TAKE 1 TABLET (20 MG TOTAL) BY MOUTH DAILY. 90 tablet 1 10/19/2016 at Unknown time  . Estradiol 1 MG/GM GEL Place onto the skin 2 (two) times daily.   Not Taking at Unknown time  . polyethylene glycol powder (GLYCOLAX/MIRALAX) powder 255 grams one bottle for colonoscopy prep 255 g 0 Taking   Allergies  Allergen Reactions  . Ace Inhibitors Cough    cough   Past Medical History:  Diagnosis Date  . Abnormal liver function   . Cystocele, unspecified (CODE)   . Fibrocystic breast disease   . GERD (gastroesophageal reflux disease)   . Hypertension   . Intrinsic sphincter deficiency   . Pelvic relaxation   . Pure  hypercholesterolemia   . Rectocele   . SUI (stress urinary incontinence, female)   . Urinary retention   . Urinary retention    Past Surgical History:  Procedure Laterality Date  . ABDOMINAL HYSTERECTOMY    . APPENDECTOMY    . Bladder Tack    . burch urethropexy  10/24/2000   laparoscopic colpopexy/culdoplasty  . COLONOSCOPY WITH PROPOFOL N/A 08/10/2016   Procedure: COLONOSCOPY WITH PROPOFOL;  Surgeon: Lollie Sails, MD;  [facility] ENDOSCOPY;  Service: Endoscopy;  Laterality: N/A;  . COLPORRHAPHY     forv repair cystocele anterior  . TRANSVAGINAL TAPE (TVT) REMOVAL     Social History   Social History  . Marital status: Married    Spouse name: N/A  . Number of children: N/A  . Years of education: N/A   Occupational History  . Not on file.   Social History Main Topics  . Smoking status: Never Smoker  . Smokeless tobacco: Never Used  . Alcohol use No  . Drug use: No  . Sexual activity: Not on file   Other Topics Concern  . Not on file   Social History Narrative  . No narrative on file   Social History   Social History Narrative  . No narrative on file     ROS: Negative.     PE: HEENT: Negative. Lungs: Clear. Cardio: RR.  Assessment/Plan:  Proceed with planned polyp removal. Possibility of inability to remove reviewed.  Robert Bellow 10/20/2016

## 2016-10-20 NOTE — Op Note (Signed)
[facility] Patient Name: Lindsey Harmon Procedure Date: 10/20/2016 10:22 AM MRN: [id number] Account #: [id number] Date of Birth: 04-27-1946 Admit Type: Outpatient Age: 71 Room: [facility] ENDO ROOM 1 Gender: Female Note Status: Finalized Procedure:            Colonoscopy Indications:          Therapeutic procedure for known colon polyps Providers:            Robert Bellow, MD Referring MD:         Einar Pheasant, MD (Referring MD) Medicines:            Monitored Anesthesia Care Complications:        No immediate complications. Procedure:            Pre-Anesthesia Assessment:                       - Prior to the procedure, a History and Physical was                        performed, and patient medications, allergies and                        sensitivities were reviewed. The patient's tolerance of                        previous anesthesia was reviewed.                       - The risks and benefits of the procedure and the                        sedation options and risks were discussed with the                        patient. All questions were answered and informed                        consent was obtained.                       After obtaining informed consent, the colonoscope was                        passed under direct vision. Throughout the procedure,                        the patient's blood pressure, pulse, and oxygen                        saturations were monitored continuously. The                        Colonoscope was introduced through the anus and                        advanced to the the descending colon to examine a                        polypectomy site. This was the intended extent. The  colonoscopy was performed without difficulty. The                        patient tolerated the procedure well. The quality of                        the bowel preparation was excellent. Findings:      A 45 mm polyp  was found in the descending colon mid descending colon.       The polyp was sessile. 12 cc NS injection utilized. Duckbill snare used.       Central portion removed with biopsy forceps. To close a defect after       mucosal resection, one hemostatic clip was successfully placed (MR       conditional). There was no bleeding at the end of the procedure.      A 10 mm polyp was found in the distal descending colon. The polyp was       sessile. Biopsies were taken with a cold forceps for histology. Impression:           - One 45 mm polyp in the descending colon in the mid                        descending colon. Clip (MR conditional) was placed.                       - One 10 mm polyp in the distal descending colon.                        Biopsied. Recommendation:       - Telephone endoscopist for pathology results in 1 week. Procedure Code(s):    --- Professional ---                       [phone], 28, Colonoscopy, flexible; with biopsy, single                        or multiple Diagnosis Code(s):    --- Professional ---                       D12.4, Benign neoplasm of descending colon                       K63.5, Polyp of colon CPT copyright 2016 American Medical Association. All rights reserved. The codes documented in this report are preliminary and upon coder review may  be revised to meet current compliance requirements. Robert Bellow, MD 10/20/2016 11:32:21 AM This report has been signed electronically. Number of Addenda: 0 Note Initiated On: 10/20/2016 10:22 AM Total Procedure Duration: 0 hours 46 minutes 7 seconds       [facility]

## 2016-10-20 NOTE — Transfer of Care (Signed)
Immediate Anesthesia Transfer of Care Note  Patient: Lindsey Harmon  Procedure(s) Performed: Procedure(s): COLONOSCOPY WITH PROPOFOL (N/A)  Patient Location: PACU and Endoscopy Unit  Anesthesia Type:General  Level of Consciousness: awake, alert  and oriented  Airway & Oxygen Therapy: Patient Spontanous Breathing and Patient connected to nasal cannula oxygen  Post-op Assessment: Report given to RN and Post -op Vital signs reviewed and stable  Post vital signs: Reviewed and stable  Last Vitals:  Vitals:   10/20/16 1132 10/20/16 1134  BP: (!) 108/55 (!) 108/55  Pulse: 70 67  Resp: 16 16  Temp: 36.3 C     Last Pain:  Vitals:   10/20/16 1132  TempSrc: Tympanic         Complications: No apparent anesthesia complications

## 2016-10-21 ENCOUNTER — Encounter: Payer: Self-pay | Admitting: General Surgery

## 2016-10-21 LAB — SURGICAL PATHOLOGY

## 2016-10-22 NOTE — Anesthesia Postprocedure Evaluation (Signed)
Anesthesia Post Note  Patient: Lindsey Harmon  Procedure(s) Performed: Procedure(s) (LRB): COLONOSCOPY WITH PROPOFOL (N/A)  Patient location during evaluation: PACU Anesthesia Type: General Level of consciousness: awake and alert Pain management: pain level controlled Vital Signs Assessment: post-procedure vital signs reviewed and stable Respiratory status: spontaneous breathing, nonlabored ventilation, respiratory function stable and patient connected to nasal cannula oxygen Cardiovascular status: blood pressure returned to baseline and stable Postop Assessment: no signs of nausea or vomiting Anesthetic complications: no     Last Vitals:  Vitals:   10/20/16 1134 10/20/16 1144  BP: (!) 108/55 (!) 113/55  Pulse: 67 63  Resp: 16 17  Temp:      Last Pain:  Vitals:   10/21/16 0731  TempSrc:   PainSc: 0-No pain                 Molli Barrows

## 2016-10-28 ENCOUNTER — Other Ambulatory Visit: Payer: Self-pay | Admitting: Internal Medicine

## 2016-11-22 ENCOUNTER — Other Ambulatory Visit (INDEPENDENT_AMBULATORY_CARE_PROVIDER_SITE_OTHER): Payer: Medicare Other

## 2016-11-22 DIAGNOSIS — R739 Hyperglycemia, unspecified: Secondary | ICD-10-CM

## 2016-11-22 DIAGNOSIS — E7800 Pure hypercholesterolemia, unspecified: Secondary | ICD-10-CM | POA: Diagnosis not present

## 2016-11-22 LAB — LIPID PANEL
Cholesterol: 162 mg/dL (ref 0–200)
HDL: 45.1 mg/dL (ref >39.00)
LDL Cholesterol: 77 mg/dL (ref 0–99)
NonHDL: 117.35
Total CHOL/HDL Ratio: 4
Triglycerides: 200 mg/dL — ABNORMAL HIGH (ref 0.0–149.0)
VLDL: 40 mg/dL (ref 0.0–40.0)

## 2016-11-22 LAB — HEPATIC FUNCTION PANEL
ALBUMIN: 4.2 g/dL (ref 3.5–5.2)
ALT: 30 U/L (ref 0–35)
AST: 29 U/L (ref 0–37)
Alkaline Phosphatase: 66 U/L (ref 39–117)
Bilirubin, Direct: 0.2 mg/dL (ref 0.0–0.3)
Total Bilirubin: 0.7 mg/dL (ref 0.2–1.2)
Total Protein: 6.9 g/dL (ref 6.0–8.3)

## 2016-11-22 LAB — BASIC METABOLIC PANEL
BUN: 20 mg/dL (ref 6–23)
CALCIUM: 9.6 mg/dL (ref 8.4–10.5)
CO2: 29 meq/L (ref 19–32)
CREATININE: 1.01 mg/dL (ref 0.40–1.20)
Chloride: 100 mEq/L (ref 96–112)
GFR: 57.02 mL/min — ABNORMAL LOW (ref >60.00)
Glucose, Bld: 108 mg/dL — ABNORMAL HIGH (ref 70–99)
Potassium: 3.7 mEq/L (ref 3.5–5.1)
Sodium: 137 mEq/L (ref 135–145)

## 2016-11-22 LAB — HEMOGLOBIN A1C: HEMOGLOBIN A1C: 6.3 % (ref 4.6–6.5)

## 2016-11-23 ENCOUNTER — Encounter: Payer: Self-pay | Admitting: Internal Medicine

## 2016-11-24 ENCOUNTER — Ambulatory Visit (INDEPENDENT_AMBULATORY_CARE_PROVIDER_SITE_OTHER): Payer: Medicare Other | Admitting: Internal Medicine

## 2016-11-24 ENCOUNTER — Encounter: Payer: Self-pay | Admitting: Internal Medicine

## 2016-11-24 DIAGNOSIS — F429 Obsessive-compulsive disorder, unspecified: Secondary | ICD-10-CM

## 2016-11-24 DIAGNOSIS — Z86010 Personal history of colonic polyps: Secondary | ICD-10-CM

## 2016-11-24 DIAGNOSIS — R945 Abnormal results of liver function studies: Secondary | ICD-10-CM

## 2016-11-24 DIAGNOSIS — Z23 Encounter for immunization: Secondary | ICD-10-CM | POA: Diagnosis not present

## 2016-11-24 DIAGNOSIS — R739 Hyperglycemia, unspecified: Secondary | ICD-10-CM

## 2016-11-24 DIAGNOSIS — N289 Disorder of kidney and ureter, unspecified: Secondary | ICD-10-CM

## 2016-11-24 DIAGNOSIS — E7800 Pure hypercholesterolemia, unspecified: Secondary | ICD-10-CM | POA: Diagnosis not present

## 2016-11-24 DIAGNOSIS — K7689 Other specified diseases of liver: Secondary | ICD-10-CM | POA: Diagnosis not present

## 2016-11-24 DIAGNOSIS — Z6831 Body mass index (BMI) 31.0-31.9, adult: Secondary | ICD-10-CM

## 2016-11-24 DIAGNOSIS — I10 Essential (primary) hypertension: Secondary | ICD-10-CM

## 2016-11-24 NOTE — Progress Notes (Signed)
Pre-visit discussion using our clinic review tool. No additional management support is needed unless otherwise documented below in the visit note.  

## 2016-11-24 NOTE — Progress Notes (Signed)
Patient ID: Lindsey Harmon, female   DOB: Apr 14, 1946, 71 y.o.   MRN: [id number]   Subjective:    Patient ID: Lindsey Harmon, female    DOB: Jul 09, 1945, 71 y.o.   MRN: [id number]  HPI  Patient here for a scheduled follow up.  States she is doing well.  Some increased stress recently with colonoscopy and f/u scopes. Seeing Dr Bary Castilla.  Large polyp found on colonoscopy.  Was referred to Dr Bary Castilla.  Had endoscopic resection 10/20/16.  Pathology - tubulovillous adenoma and also tubular adenoma removed (smaller polyp).  Is scheduled in 6 months for f/u colonoscopy.  Eating and drinking well.  No nausea or vomiting.  Bowels moving.  No abdominal pain or cramping.  Taking zoloft.  Does not feel needs anything more.     Past Medical History:  Diagnosis Date  . Abnormal liver function   . Cystocele, unspecified (CODE)   . Fibrocystic breast disease   . GERD (gastroesophageal reflux disease)   . Hypertension   . Intrinsic sphincter deficiency   . Pelvic relaxation   . Pure hypercholesterolemia   . Rectocele   . SUI (stress urinary incontinence, female)   . Urinary retention   . Urinary retention    Past Surgical History:  Procedure Laterality Date  . ABDOMINAL HYSTERECTOMY    . APPENDECTOMY    . Bladder Tack    . burch urethropexy  10/24/2000   laparoscopic colpopexy/culdoplasty  . COLONOSCOPY WITH PROPOFOL N/A 08/10/2016   Procedure: COLONOSCOPY WITH PROPOFOL;  Surgeon: Lollie Sails, MD;  Location: [facility] ENDOSCOPY;  Service: Endoscopy;  Laterality: N/A;  . COLONOSCOPY WITH PROPOFOL N/A 10/20/2016   Procedure: COLONOSCOPY WITH PROPOFOL;  Surgeon: Robert Bellow, MD;  Location: [facility] ENDOSCOPY;  Service: Endoscopy;  Laterality: N/A;  . COLPORRHAPHY     forv repair cystocele anterior  . TRANSVAGINAL TAPE (TVT) REMOVAL     Family History  Problem Relation Age of Onset  . Pneumonia Mother        died of presumed aspiration pneumonia  . Hypertension Mother   . Cancer Father    prostate  . Heart disease Paternal Grandfather        myocardial infarction   Social History   Social History  . Marital status: Married    Spouse name: N/A  . Number of children: N/A  . Years of education: N/A   Social History Main Topics  . Smoking status: Never Smoker  . Smokeless tobacco: Never Used  . Alcohol use No  . Drug use: No  . Sexual activity: Not Asked   Other Topics Concern  . None   Social History Narrative  . None    Outpatient Encounter Prescriptions as of 11/24/2016  Medication Sig  . bisoprolol (ZEBETA) 5 MG tablet TAKE 1 TABLET BY MOUTH DAILY.  . Calcium Carbonate-Vitamin D (CALTRATE 600+D PO) Take by mouth.  . cephALEXin (KEFLEX) 250 MG capsule Take 250 mg by mouth daily.   . hydrochlorothiazide (HYDRODIURIL) 25 MG tablet TAKE 1 TABLET BY MOUTH DAILY (Patient taking differently: TAKE 1/2 TABLET BY MOUTH DAILY)  . losartan (COZAAR) 100 MG tablet TAKE 1 TABLET (100 MG TOTAL) BY MOUTH DAILY.  Marland Kitchen Omega-3 Fatty Acids (FISH OIL) 1200 MG CAPS Take by mouth daily.  Marland Kitchen omeprazole (PRILOSEC) 20 MG capsule TAKE 1 CAPSULE (20 MG TOTAL) BY MOUTH 2 (TWO) TIMES DAILY.  Marland Kitchen sertraline (ZOLOFT) 50 MG tablet TAKE 1 AND 1/2 TABLETS EVERY DAY  . simvastatin (ZOCOR) 20  MG tablet TAKE 1 TABLET (20 MG TOTAL) BY MOUTH DAILY.  . [DISCONTINUED] Estradiol 1 MG/GM GEL Place onto the skin 2 (two) times daily.   No facility-administered encounter medications on file as of 11/24/2016.     Review of Systems  Constitutional: Negative for appetite change and unexpected weight change.  HENT: Negative for congestion and sinus pressure.   Respiratory: Negative for cough, chest tightness and shortness of breath.   Cardiovascular: Negative for chest pain, palpitations and leg swelling.  Gastrointestinal: Negative for abdominal pain, diarrhea, nausea and vomiting.  Genitourinary: Negative for difficulty urinating and dysuria.  Musculoskeletal: Negative for back pain and joint swelling.    Skin: Negative for color change and rash.  Neurological: Negative for dizziness, light-headedness and headaches.  Psychiatric/Behavioral: Negative for agitation and dysphoric mood.       Objective:    Physical Exam  Constitutional: She appears well-developed and well-nourished. No distress.  HENT:  Nose: Nose normal.  Mouth/Throat: Oropharynx is clear and moist.  Neck: Neck supple. No thyromegaly present.  Cardiovascular: Normal rate and regular rhythm.   Pulmonary/Chest: Breath sounds normal. No respiratory distress. She has no wheezes.  Abdominal: Soft. Bowel sounds are normal. There is no tenderness.  Musculoskeletal: She exhibits no edema or tenderness.  Lymphadenopathy:    She has no cervical adenopathy.  Skin: No rash noted. No erythema.  Psychiatric: She has a normal mood and affect. Her behavior is normal.    BP 120/68 (BP Location: Left Arm, Patient Position: Sitting, Cuff Size: Normal)   Pulse (!) 54   Temp 98.5 F (36.9 C) (Oral)   Resp 12   Ht '5\' 4"'  (1.626 m)   Wt 184 lb 3.2 oz (83.6 kg)   SpO2 95%   BMI 31.62 kg/m  Wt Readings from Last 3 Encounters:  11/24/16 184 lb 3.2 oz (83.6 kg)  10/20/16 183 lb (83 kg)  09/21/16 184 lb 1.9 oz (83.5 kg)     Lab Results  Component Value Date   WBC 6.9 05/18/2016   HGB 12.7 05/18/2016   HCT 36.8 05/18/2016   PLT 224.0 05/18/2016   GLUCOSE 108 (H) 11/22/2016   CHOL 162 11/22/2016   TRIG 200.0 (H) 11/22/2016   HDL 45.10 11/22/2016   LDLDIRECT 98.0 11/11/2015   LDLCALC 77 11/22/2016   ALT 30 11/22/2016   AST 29 11/22/2016   NA 137 11/22/2016   K 3.7 11/22/2016   CL 100 11/22/2016   CREATININE 1.01 11/22/2016   BUN 20 11/22/2016   CO2 29 11/22/2016   TSH 4.09 05/18/2016   HGBA1C 6.3 11/22/2016       Assessment & Plan:   Problem List Items Addressed This Visit    Abnormal liver function    Worked up by GI.  Felt to be due to fatty liver.  Recent liver panel wnl.  Follow.  Continue diet and exercise.         BMI 31.0-31.9,adult    Discussed diet and exercise.  Follow.       Essential hypertension, benign    Blood pressure under good control.  Continue same medication regimen.  Follow pressures.  Follow metabolic panel.        Relevant Orders   Basic metabolic panel   History of colon polyps    Colonoscopy as outlined.  F/u endoscopic resection of tubulovillous adenoma and tubular adenoma as outlined.  Seeing Dr Bary Castilla.  States planned f/u colonoscopy in 6 months.  Hypercholesterolemia    Discussed cholesterol results.  Low cholesterol diet and exercise.  On simvastatin.  Follow lipid panel and liver function tests.        Relevant Orders   Hepatic function panel   Lipid panel   Hyperglycemia    Low carb diet and exercise.  Follow met b and a1c.   Lab Results  Component Value Date   HGBA1C 6.3 11/22/2016        Relevant Orders   Hemoglobin A1c   Obsessive compulsive disorder    On zoloft.  Feel things are stable.  Follow. Desires no further intervention or evaluation.       Renal insufficiency    Creatinine 1.01 (GFR 57).  Stable.  Follow.           Einar Pheasant, MD

## 2016-11-27 ENCOUNTER — Encounter: Payer: Self-pay | Admitting: Internal Medicine

## 2016-11-27 NOTE — Assessment & Plan Note (Signed)
On zoloft.  Feel things are stable.  Follow. Desires no further intervention or evaluation.

## 2016-11-27 NOTE — Assessment & Plan Note (Signed)
Discussed diet and exercise.  Follow.  

## 2016-11-27 NOTE — Assessment & Plan Note (Signed)
Blood pressure under good control.  Continue same medication regimen.  Follow pressures.  Follow metabolic panel.   

## 2016-11-27 NOTE — Assessment & Plan Note (Signed)
Worked up by GI.  Felt to be due to fatty liver.  Recent liver panel wnl.  Follow.  Continue diet and exercise.

## 2016-11-27 NOTE — Assessment & Plan Note (Signed)
Creatinine 1.01 (GFR 57).  Stable.  Follow.

## 2016-11-27 NOTE — Assessment & Plan Note (Signed)
Colonoscopy as outlined.  F/u endoscopic resection of tubulovillous adenoma and tubular adenoma as outlined.  Seeing Dr Bary Castilla.  States planned f/u colonoscopy in 6 months.

## 2016-11-27 NOTE — Assessment & Plan Note (Signed)
Discussed cholesterol results.  Low cholesterol diet and exercise.  On simvastatin.  Follow lipid panel and liver function tests.

## 2016-11-27 NOTE — Assessment & Plan Note (Signed)
Low carb diet and exercise.  Follow met b and a1c.   Lab Results  Component Value Date   HGBA1C 6.3 11/22/2016

## 2017-01-04 ENCOUNTER — Other Ambulatory Visit: Payer: Self-pay | Admitting: Internal Medicine

## 2017-02-22 ENCOUNTER — Ambulatory Visit (INDEPENDENT_AMBULATORY_CARE_PROVIDER_SITE_OTHER): Payer: Medicare Other

## 2017-02-22 DIAGNOSIS — Z23 Encounter for immunization: Secondary | ICD-10-CM

## 2017-04-06 ENCOUNTER — Other Ambulatory Visit: Payer: Self-pay | Admitting: Obstetrics and Gynecology

## 2017-04-06 DIAGNOSIS — Z1231 Encounter for screening mammogram for malignant neoplasm of breast: Secondary | ICD-10-CM

## 2017-04-19 ENCOUNTER — Other Ambulatory Visit: Payer: Self-pay | Admitting: Internal Medicine

## 2017-04-25 ENCOUNTER — Ambulatory Visit: Payer: Medicare Other | Admitting: General Surgery

## 2017-05-24 ENCOUNTER — Ambulatory Visit: Payer: Medicare Other | Admitting: General Surgery

## 2017-05-24 ENCOUNTER — Encounter: Payer: Self-pay | Admitting: General Surgery

## 2017-05-24 VITALS — BP 138/74 | HR 53 | Resp 14 | Ht 64.0 in | Wt 186.0 lb

## 2017-05-24 DIAGNOSIS — Z86010 Personal history of colonic polyps: Secondary | ICD-10-CM | POA: Diagnosis not present

## 2017-05-24 MED ORDER — POLYETHYLENE GLYCOL 3350 17 GM/SCOOP PO POWD: 1.0000 | Freq: Once | ORAL | 0 refills | Status: AC

## 2017-05-24 NOTE — Progress Notes (Signed)
Patient ID: Lindsey Harmon, female   DOB: [birth year], 72 y.o.   MRN: [id number]  Chief Complaint  Patient presents with  . Follow-up    HPI Lindsey Harmon is a 72 y.o. female here today for her follow up colonoscopy done on 10/20/2016. No GI problems at this time. Moves her bowels daily.   Past Medical History:  Diagnosis Date  . Abnormal liver function   . Cystocele, unspecified (CODE)   . Fibrocystic breast disease   . GERD (gastroesophageal reflux disease)   . Hypertension   . Intrinsic sphincter deficiency   . Pelvic relaxation   . Pure hypercholesterolemia   . Rectocele   . SUI (stress urinary incontinence, female)   . Urinary retention   . Urinary retention     Past Surgical History:  Procedure Laterality Date  . ABDOMINAL HYSTERECTOMY    . APPENDECTOMY    . Bladder Tack    . burch urethropexy  10/24/2000   laparoscopic colpopexy/culdoplasty  . COLONOSCOPY WITH PROPOFOL N/A 08/10/2016   Procedure: COLONOSCOPY WITH PROPOFOL;  Surgeon: Lollie Sails, MD;  Location: [facility] ENDOSCOPY;  Service: Endoscopy;  Laterality: N/A;  . COLONOSCOPY WITH PROPOFOL N/A 10/20/2016   Procedure: COLONOSCOPY WITH PROPOFOL;  Surgeon: Robert Bellow, MD;  Location: [facility] ENDOSCOPY;  Service: Endoscopy;  Laterality: N/A;  . COLPORRHAPHY     forv repair cystocele anterior  . TRANSVAGINAL TAPE (TVT) REMOVAL      Family History  Problem Relation Age of Onset  . Pneumonia Mother        died of presumed aspiration pneumonia  . Hypertension Mother   . Cancer Father        prostate  . Heart disease Paternal Grandfather        myocardial infarction    Social History Social History   Tobacco Use  . Smoking status: Never Smoker  . Smokeless tobacco: Never Used  Substance Use Topics  . Alcohol use: No    Alcohol/week: 0.0 oz  . Drug use: No    Allergies  Allergen Reactions  . Ace Inhibitors Cough    cough    Current Outpatient Medications  Medication Sig Dispense Refill  .  bisoprolol (ZEBETA) 5 MG tablet TAKE 1 TABLET BY MOUTH DAILY. 90 tablet 2  . Calcium Carbonate-Vitamin D (CALTRATE 600+D PO) Take by mouth.    . cephALEXin (KEFLEX) 250 MG capsule Take 250 mg by mouth daily.     . hydrochlorothiazide (HYDRODIURIL) 25 MG tablet TAKE 1 TABLET BY MOUTH EVERY DAY 90 tablet 1  . losartan (COZAAR) 100 MG tablet TAKE 1 TABLET BY MOUTH EVERY DAY 90 tablet 1  . Omega-3 Fatty Acids (FISH OIL) 1200 MG CAPS Take by mouth daily.    Marland Kitchen omeprazole (PRILOSEC) 20 MG capsule TAKE 1 CAPSULE (20 MG TOTAL) BY MOUTH 2 (TWO) TIMES DAILY. 180 capsule 1  . sertraline (ZOLOFT) 50 MG tablet TAKE 1 AND 1/2 TABLETS EVERY DAY 135 tablet 1  . simvastatin (ZOCOR) 20 MG tablet TAKE 1 TABLET BY MOUTH EVERY DAY 90 tablet 1  . polyethylene glycol powder (GLYCOLAX/MIRALAX) powder Take 255 g by mouth once for 1 dose. Mix whole container with 64 ounces of clear liquids 255 g 0   No current facility-administered medications for this visit.     Review of Systems Review of Systems  Constitutional: Negative.   Respiratory: Negative.   Cardiovascular: Negative.     Blood pressure 138/74, pulse (!) 53, resp. rate 14, height  5\' 4"  (1.626 m), weight 186 lb (84.4 kg).  Physical Exam Physical Exam  Constitutional: She is oriented to person, place, and time. She appears well-developed and well-nourished.  Cardiovascular: Normal rate, regular rhythm and normal heart sounds.  Pulmonary/Chest: Effort normal.  Neurological: She is alert and oriented to person, place, and time.  Skin: Skin is warm and dry.    Data Reviewed October 20, 2016 polypectomy results: DIAGNOSIS:  A. COLON POLYP, PROXIMAL DESCENDING AT 60 CM; HOT SNARE:  - TUBULOVILLOUS ADENOMA, MULTIPLE FRAGMENTS.  - NEGATIVE FOR HIGH-GRADE DYSPLASIA AND MALIGNANCY.   B. COLON POLYP, DISTAL DESCENDING; HOT SNARE:  - TUBULAR ADENOMA, 3 FRAGMENTS.  - NEGATIVE FOR HIGH-GRADE DYSPLASIA AND MALIGNANCY.   C. BASE OF COLON POLYP, PROXIMAL  DESCENDING AT 60 CM; COLD BIOPSY:  - TWO MUCOSAL FRAGMENTS WITH CAUTERY ARTIFACT LIMITING ASSESSMENT FOR  DYSPLASIA.  - NEGATIVE FOR MALIGNANCY.   August 10, 2016 to biopsy results: B. COLON POLYP, DISTAL DESCENDING; COLD BIOPSY:  - VILLOUS ADENOMA, 2 FRAGMENTS.  - NEGATIVE FOR HIGH-GRADE DYSPLASIA AND MALIGNANCY.   Colonoscopy originally completed for a positive cold guard test.  Multiple tubular adenomas were identified in the colon including a tubulovillous adenoma in the cecum.  Assessment    Doing well status post endoscopic resection of a 50 mm polyp in the descending colon.  Candidate for follow-up exam.    Plan    The opportunity to have the study completed with Dr. Gustavo Lah had been discussed with the patient.  She declined.  We will plan for repeat colonoscopy at this time, with recommendation for future studies based on the number of polyps identified.  Colonoscopy with possible biopsy/polypectomy prn: Information regarding the procedure, including its potential risks and complications (including but not limited to perforation of the bowel, which may require emergency surgery to repair, and bleeding) was verbally given to the patient. Educational information regarding lower intestinal endoscopy was given to the patient. Written instructions for how to complete the bowel prep using Miralax were provided. The importance of drinking ample fluids to avoid dehydration as a result of the prep emphasized.  HPI, Physical Exam, Assessment and Plan have been scribed under the direction and in the presence of Hervey Ard, MD.  Gaspar Cola, CMA  The patient is scheduled for a Colonoscopy at [facility] on [facility]/01/17. They are aware to call the day before to get their arrival time. She will stop her fish oil one week prior. Miralax prescription has been sent into the patient's pharmacy. The patient is aware of date and instructions.  Documented by Lesly Rubenstein LPN  I have completed the  exam and reviewed the above documentation for accuracy and completeness.  I agree with the above.  Haematologist has been used and any errors in dictation or transcription are unintentional.  Hervey Ard, M.D., F.A.C.S.  Robert Bellow 05/24/2017, 11:16 AM

## 2017-05-24 NOTE — Patient Instructions (Addendum)
Colonoscopy, Adult A colonoscopy is an exam to look at the entire large intestine. During the exam, a lubricated, bendable tube is inserted into the anus and then passed into the rectum, colon, and other parts of the large intestine. A colonoscopy is often done as a part of normal colorectal screening or in response to certain symptoms, such as anemia, persistent diarrhea, abdominal pain, and blood in the stool. The exam can help screen for and diagnose medical problems, including:  Tumors.  Polyps.  Inflammation.  Areas of bleeding.  Tell a health care provider about:  Any allergies you have.  All medicines you are taking, including vitamins, herbs, eye drops, creams, and over-the-counter medicines.  Any problems you or family members have had with anesthetic medicines.  Any blood disorders you have.  Any surgeries you have had.  Any medical conditions you have.  Any problems you have had passing stool. What are the risks? Generally, this is a safe procedure. However, problems may occur, including:  Bleeding.  A tear in the intestine.  A reaction to medicines given during the exam.  Infection (rare).  What happens before the procedure? Eating and drinking restrictions Follow instructions from your health care provider about eating and drinking, which may include:  A few days before the procedure - follow a low-fiber diet. Avoid nuts, seeds, dried fruit, raw fruits, and vegetables.  1-3 days before the procedure - follow a clear liquid diet. Drink only clear liquids, such as clear broth or bouillon, black coffee or tea, clear juice, clear soft drinks or sports drinks, gelatin dessert, and popsicles. Avoid any liquids that contain red or purple dye.  On the day of the procedure - do not eat or drink anything during the 2 hours before the procedure, or within the time period that your health care provider recommends.  Bowel prep If you were prescribed an oral bowel prep  to clean out your colon:  Take it as told by your health care provider. Starting the day before your procedure, you will need to drink a large amount of medicated liquid. The liquid will cause you to have multiple loose stools until your stool is almost clear or light green.  If your skin or anus gets irritated from diarrhea, you may use these to relieve the irritation: ? Medicated wipes, such as adult wet wipes with aloe and vitamin E. ? A skin soothing-product like petroleum jelly.  If you vomit while drinking the bowel prep, take a break for up to 60 minutes and then begin the bowel prep again. If vomiting continues and you cannot take the bowel prep without vomiting, call your health care provider.  General instructions  Ask your health care provider about changing or stopping your regular medicines. This is especially important if you are taking diabetes medicines or blood thinners.  Plan to have someone take you home from the hospital or clinic. What happens during the procedure?  An IV tube may be inserted into one of your veins.  You will be given medicine to help you relax (sedative).  To reduce your risk of infection: ? Your health care team will wash or sanitize their hands. ? Your anal area will be washed with soap.  You will be asked to lie on your side with your knees bent.  Your health care provider will lubricate a long, thin, flexible tube. The tube will have a camera and a light on the end.  The tube will be inserted into your   anus.  The tube will be gently eased through your rectum and colon.  Air will be delivered into your colon to keep it open. You may feel some pressure or cramping.  The camera will be used to take images during the procedure.  A small tissue sample may be removed from your body to be examined under a microscope (biopsy). If any potential problems are found, the tissue will be sent to a lab for testing.  If small polyps are found, your  health care provider may remove them and have them checked for cancer cells.  The tube that was inserted into your anus will be slowly removed. The procedure may vary among health care providers and hospitals. What happens after the procedure?  Your blood pressure, heart rate, breathing rate, and blood oxygen level will be monitored until the medicines you were given have worn off.  Do not drive for 24 hours after the exam.  You may have a small amount of blood in your stool.  You may pass gas and have mild abdominal cramping or bloating due to the air that was used to inflate your colon during the exam.  It is up to you to get the results of your procedure. Ask your health care provider, or the department performing the procedure, when your results will be ready. This information is not intended to replace advice given to you by your health care provider. Make sure you discuss any questions you have with your health care provider. Document Released: 04/30/2000 Document Revised: 03/03/2016 Document Reviewed: 07/15/2015 Elsevier Interactive Patient Education  [employer].  The patient is scheduled for a Colonoscopy at [facility] on 06/01/17. They are aware to call the day before to get their arrival time. She will stop her fish oil one week prior. Miralax prescription has been sent into the patient's pharmacy. The patient is aware of date and instructions.

## 2017-05-25 ENCOUNTER — Other Ambulatory Visit (INDEPENDENT_AMBULATORY_CARE_PROVIDER_SITE_OTHER): Payer: Medicare Other

## 2017-05-25 DIAGNOSIS — I10 Essential (primary) hypertension: Secondary | ICD-10-CM

## 2017-05-25 DIAGNOSIS — E7800 Pure hypercholesterolemia, unspecified: Secondary | ICD-10-CM | POA: Diagnosis not present

## 2017-05-25 DIAGNOSIS — R739 Hyperglycemia, unspecified: Secondary | ICD-10-CM | POA: Diagnosis not present

## 2017-05-25 LAB — LIPID PANEL
CHOL/HDL RATIO: 4
Cholesterol: 163 mg/dL (ref 0–200)
HDL: 46.3 mg/dL (ref >39.00)
LDL Cholesterol: 84 mg/dL (ref 0–99)
NonHDL: 116.84
TRIGLYCERIDES: 165 mg/dL — AB (ref 0.0–149.0)
VLDL: 33 mg/dL (ref 0.0–40.0)

## 2017-05-25 LAB — HEPATIC FUNCTION PANEL
ALBUMIN: 4.2 g/dL (ref 3.5–5.2)
ALK PHOS: 63 U/L (ref 39–117)
ALT: 26 U/L (ref 0–35)
AST: 24 U/L (ref 0–37)
Bilirubin, Direct: 0.1 mg/dL (ref 0.0–0.3)
TOTAL PROTEIN: 6.7 g/dL (ref 6.0–8.3)
Total Bilirubin: 0.7 mg/dL (ref 0.2–1.2)

## 2017-05-25 LAB — BASIC METABOLIC PANEL
BUN: 18 mg/dL (ref 6–23)
CALCIUM: 9.3 mg/dL (ref 8.4–10.5)
CO2: 29 meq/L (ref 19–32)
Chloride: 100 mEq/L (ref 96–112)
Creatinine, Ser: 0.98 mg/dL (ref 0.40–1.20)
GFR: 58.96 mL/min — ABNORMAL LOW (ref >60.00)
Glucose, Bld: 105 mg/dL — ABNORMAL HIGH (ref 70–99)
POTASSIUM: 3.9 meq/L (ref 3.5–5.1)
SODIUM: 138 meq/L (ref 135–145)

## 2017-05-25 LAB — HEMOGLOBIN A1C: Hgb A1c MFr Bld: 6.2 % (ref 4.6–6.5)

## 2017-05-26 ENCOUNTER — Encounter: Payer: Self-pay | Admitting: Internal Medicine

## 2017-05-27 ENCOUNTER — Encounter: Payer: Self-pay | Admitting: Internal Medicine

## 2017-05-27 ENCOUNTER — Ambulatory Visit: Payer: Medicare Other | Admitting: Internal Medicine

## 2017-05-27 DIAGNOSIS — K7689 Other specified diseases of liver: Secondary | ICD-10-CM | POA: Diagnosis not present

## 2017-05-27 DIAGNOSIS — N289 Disorder of kidney and ureter, unspecified: Secondary | ICD-10-CM

## 2017-05-27 DIAGNOSIS — Z6832 Body mass index (BMI) 32.0-32.9, adult: Secondary | ICD-10-CM

## 2017-05-27 DIAGNOSIS — E7800 Pure hypercholesterolemia, unspecified: Secondary | ICD-10-CM

## 2017-05-27 DIAGNOSIS — R739 Hyperglycemia, unspecified: Secondary | ICD-10-CM

## 2017-05-27 DIAGNOSIS — Z87440 Personal history of urinary (tract) infections: Secondary | ICD-10-CM | POA: Diagnosis not present

## 2017-05-27 DIAGNOSIS — Z860100 Personal history of colon polyps, unspecified: Secondary | ICD-10-CM

## 2017-05-27 DIAGNOSIS — I10 Essential (primary) hypertension: Secondary | ICD-10-CM

## 2017-05-27 DIAGNOSIS — R945 Abnormal results of liver function studies: Secondary | ICD-10-CM

## 2017-05-27 DIAGNOSIS — F429 Obsessive-compulsive disorder, unspecified: Secondary | ICD-10-CM | POA: Diagnosis not present

## 2017-05-27 NOTE — Progress Notes (Signed)
Patient ID: Lindsey Harmon, female   DOB: 07/22/1945, 72 y.o.   MRN: [id number]   Subjective:    Patient ID: Lindsey Harmon, female    DOB: Oct 28, 1945, 72 y.o.   MRN: [id number]  HPI  Patient here for a scheduled follow up.  She reports she is doing well.  Feels good.  No chest pain.  No sob.  No acid reflux.  No abdominal pain.  Bowels moving.  Saw gyn - Dr Ouida Sills.  States everything checked out fine.  Planning for f/u colonoscopy next week.  Some anxiety related to this.  States her blood pressures on outside checks averaging 130/70.    Past Medical History:  Diagnosis Date  . Abnormal liver function   . Cystocele, unspecified (CODE)   . Fibrocystic breast disease   . GERD (gastroesophageal reflux disease)   . Hypertension   . Intrinsic sphincter deficiency   . Pelvic relaxation   . Pure hypercholesterolemia   . Rectocele   . SUI (stress urinary incontinence, female)   . Urinary retention   . Urinary retention    Past Surgical History:  Procedure Laterality Date  . ABDOMINAL HYSTERECTOMY    . APPENDECTOMY    . Bladder Tack    . burch urethropexy  10/24/2000   laparoscopic colpopexy/culdoplasty  . COLONOSCOPY WITH PROPOFOL N/A 08/10/2016   Procedure: COLONOSCOPY WITH PROPOFOL;  Surgeon: Lollie Sails, MD;  Location: [facility] ENDOSCOPY;  Service: Endoscopy;  Laterality: N/A;  . COLONOSCOPY WITH PROPOFOL N/A 10/20/2016   Procedure: COLONOSCOPY WITH PROPOFOL;  Surgeon: Robert Bellow, MD;  Location: [facility] ENDOSCOPY;  Service: Endoscopy;  Laterality: N/A;  . COLPORRHAPHY     forv repair cystocele anterior  . TRANSVAGINAL TAPE (TVT) REMOVAL     Family History  Problem Relation Age of Onset  . Pneumonia Mother        died of presumed aspiration pneumonia  . Hypertension Mother   . Cancer Father        prostate  . Heart disease Paternal Grandfather        myocardial infarction   Social History   Socioeconomic History  . Marital status: Married    Spouse name: None    . Number of children: None  . Years of education: None  . Highest education level: None  Social Needs  . Financial resource strain: None  . Food insecurity - worry: None  . Food insecurity - inability: None  . Transportation needs - medical: None  . Transportation needs - non-medical: None  Occupational History  . None  Tobacco Use  . Smoking status: Never Smoker  . Smokeless tobacco: Never Used  Substance and Sexual Activity  . Alcohol use: No    Alcohol/week: 0.0 oz  . Drug use: No  . Sexual activity: None  Other Topics Concern  . None  Social History Narrative  . None    Outpatient Encounter Medications as of 05/27/2017  Medication Sig  . bisoprolol (ZEBETA) 5 MG tablet TAKE 1 TABLET BY MOUTH DAILY.  . Calcium Carbonate-Vitamin D (CALTRATE 600+D PO) Take by mouth.  . cephALEXin (KEFLEX) 250 MG capsule Take 250 mg by mouth daily.   . hydrochlorothiazide (HYDRODIURIL) 25 MG tablet TAKE 1 TABLET BY MOUTH EVERY DAY  . losartan (COZAAR) 100 MG tablet TAKE 1 TABLET BY MOUTH EVERY DAY  . Omega-3 Fatty Acids (FISH OIL) 1200 MG CAPS Take by mouth daily.  Marland Kitchen omeprazole (PRILOSEC) 20 MG capsule TAKE 1 CAPSULE (20 MG  TOTAL) BY MOUTH 2 (TWO) TIMES DAILY.  Marland Kitchen sertraline (ZOLOFT) 50 MG tablet TAKE 1 AND 1/2 TABLETS EVERY DAY  . simvastatin (ZOCOR) 20 MG tablet TAKE 1 TABLET BY MOUTH EVERY DAY   No facility-administered encounter medications on file as of 05/27/2017.     Review of Systems  Constitutional: Negative for appetite change and unexpected weight change.  HENT: Negative for congestion and sinus pressure.   Respiratory: Negative for cough, chest tightness and shortness of breath.   Cardiovascular: Negative for chest pain, palpitations and leg swelling.  Gastrointestinal: Negative for abdominal pain, diarrhea, nausea and vomiting.  Genitourinary: Negative for difficulty urinating and dysuria.  Musculoskeletal: Negative for back pain and joint swelling.  Skin: Negative for  color change and rash.  Neurological: Negative for dizziness, light-headedness and headaches.  Psychiatric/Behavioral: Negative for agitation and dysphoric mood.       Objective:     Blood pressure rechecked by me:  138-140/78  Physical Exam  Constitutional: She appears well-developed and well-nourished. No distress.  HENT:  Nose: Nose normal.  Mouth/Throat: Oropharynx is clear and moist.  Neck: Neck supple. No thyromegaly present.  Cardiovascular: Normal rate and regular rhythm.  Pulmonary/Chest: Breath sounds normal. No respiratory distress. She has no wheezes.  Abdominal: Soft. Bowel sounds are normal. There is no tenderness.  Musculoskeletal: She exhibits no edema or tenderness.  Lymphadenopathy:    She has no cervical adenopathy.  Skin: No rash noted. No erythema.  Psychiatric: She has a normal mood and affect. Her behavior is normal.    BP (!) 154/80   Pulse 60   Temp 98.4 F (36.9 C) (Oral)   Ht _0  (1.626 m)   Wt 188 lb (85.3 kg)   SpO2 99%   BMI 32.27 kg/m  Wt Readings from Last 3 Encounters:  05/27/17 188 lb (85.3 kg)  05/24/17 186 lb (84.4 kg)  11/24/16 184 lb 3.2 oz (83.6 kg)     Lab Results  Component Value Date   WBC 6.9 05/18/2016   HGB 12.7 05/18/2016   HCT 36.8 05/18/2016   PLT 224.0 05/18/2016   GLUCOSE 105 (H) 05/25/2017   CHOL 163 05/25/2017   TRIG 165.0 (H) 05/25/2017   HDL 46.30 05/25/2017   LDLDIRECT 98.0 11/11/2015   LDLCALC 84 05/25/2017   ALT 26 05/25/2017   AST 24 05/25/2017   NA 138 05/25/2017   K 3.9 05/25/2017   CL 100 05/25/2017   CREATININE 0.98 05/25/2017   BUN 18 05/25/2017   CO2 29 05/25/2017   TSH 4.09 05/18/2016   HGBA1C 6.2 05/25/2017       Assessment & Plan:   Problem List Items Addressed This Visit    Abnormal liver function    Worked up by GI.  Felt to be due to fatty liver.  Recent liver panel wnl.  Continue diet and exercise.        Relevant Orders   Hepatic function panel   BMI 32.0-32.9,adult     Diet and exercise.  Follow.       Essential hypertension, benign    Blood pressure has been under good control.  Slightly elevated today.  Improved on recheck.  Have her to continue current medications.  Spot check her pressure.  Notify me if elevation.  Follow.        Relevant Orders   CBC with Differential/Platelet   TSH   Basic metabolic panel   History of colon polyps    Colonoscopy as outlined.  Seeing Dr Bary Castilla.  Planning for f/u colonoscopy next week.        History of frequent urinary tract infections    Seeing gyn.  Self cath.  Follow.  Stable.        Hypercholesterolemia    Low cholesterol diet and exercise.  On simvastatin.  Follow lipid panel and liver function tests.        Relevant Orders   Lipid panel   Hyperglycemia    Low carb diet and exercise.  Follow met b and a1c.        Relevant Orders   Hemoglobin A1c   Obsessive compulsive disorder    On zoloft.  Stable.  Does not feel needs anything more at this time.  Follow.        Renal insufficiency    Creatinine has been stable.  Follow.            Einar Pheasant, MD

## 2017-05-27 NOTE — Progress Notes (Signed)
Pre visit review using our clinic review tool, if applicable. No additional management support is needed unless otherwise documented below in the visit note. 

## 2017-05-29 ENCOUNTER — Encounter: Payer: Self-pay | Admitting: Internal Medicine

## 2017-05-29 NOTE — Assessment & Plan Note (Signed)
Low cholesterol diet and exercise.  On simvastatin.  Follow lipid panel and liver function tests.   

## 2017-05-29 NOTE — Assessment & Plan Note (Signed)
Colonoscopy as outlined.  Seeing Dr Bary Castilla.  Planning for f/u colonoscopy next week.

## 2017-05-29 NOTE — Assessment & Plan Note (Signed)
Worked up by GI.  Felt to be due to fatty liver.  Recent liver panel wnl.  Continue diet and exercise.

## 2017-05-29 NOTE — Assessment & Plan Note (Signed)
Creatinine has been stable.  Follow.

## 2017-05-29 NOTE — Assessment & Plan Note (Signed)
Low carb diet and exercise.  Follow met b and a1c.   

## 2017-05-29 NOTE — Assessment & Plan Note (Signed)
Diet and exercise.  Follow.  

## 2017-05-29 NOTE — Assessment & Plan Note (Signed)
Blood pressure has been under good control.  Slightly elevated today.  Improved on recheck.  Have her to continue current medications.  Spot check her pressure.  Notify me if elevation.  Follow.

## 2017-05-29 NOTE — Assessment & Plan Note (Signed)
Seeing gyn.  Self cath.  Follow.  Stable.

## 2017-05-29 NOTE — Assessment & Plan Note (Signed)
On zoloft.  Stable.  Does not feel needs anything more at this time.  Follow.

## 2017-05-30 ENCOUNTER — Ambulatory Visit
Admission: RE | Admit: 2017-05-30 | Discharge: 2017-05-30 | Disposition: A | Discharge status: Home / Self Care | Payer: Medicare Other | Source: Ambulatory Visit | Attending: Obstetrics and Gynecology | Admitting: Obstetrics and Gynecology

## 2017-05-30 ENCOUNTER — Other Ambulatory Visit: Payer: Self-pay | Admitting: Obstetrics and Gynecology

## 2017-05-30 DIAGNOSIS — Z1231 Encounter for screening mammogram for malignant neoplasm of breast: Secondary | ICD-10-CM | POA: Diagnosis not present

## 2017-06-01 ENCOUNTER — Ambulatory Visit: Payer: Medicare Other | Admitting: Anesthesiology

## 2017-06-01 ENCOUNTER — Encounter: Payer: Self-pay | Admitting: *Deleted

## 2017-06-01 ENCOUNTER — Encounter
Admission: RE | Disposition: A | Discharge status: Home / Self Care | Payer: Self-pay | Source: Ambulatory Visit | Attending: General Surgery

## 2017-06-01 ENCOUNTER — Ambulatory Visit
Admission: RE | Admit: 2017-06-01 | Discharge: 2017-06-01 | Disposition: A | Discharge status: Home / Self Care | Payer: Medicare Other | Source: Ambulatory Visit | Attending: General Surgery | Admitting: General Surgery

## 2017-06-01 ENCOUNTER — Other Ambulatory Visit: Payer: Self-pay

## 2017-06-01 DIAGNOSIS — R112 Nausea with vomiting, unspecified: Secondary | ICD-10-CM | POA: Diagnosis not present

## 2017-06-01 DIAGNOSIS — Z79899 Other long term (current) drug therapy: Secondary | ICD-10-CM | POA: Diagnosis not present

## 2017-06-01 DIAGNOSIS — D123 Benign neoplasm of transverse colon: Secondary | ICD-10-CM | POA: Insufficient documentation

## 2017-06-01 DIAGNOSIS — E7800 Pure hypercholesterolemia, unspecified: Secondary | ICD-10-CM | POA: Diagnosis not present

## 2017-06-01 DIAGNOSIS — K219 Gastro-esophageal reflux disease without esophagitis: Secondary | ICD-10-CM | POA: Diagnosis not present

## 2017-06-01 DIAGNOSIS — Z860100 Personal history of colon polyps, unspecified: Secondary | ICD-10-CM

## 2017-06-01 DIAGNOSIS — I10 Essential (primary) hypertension: Secondary | ICD-10-CM | POA: Diagnosis not present

## 2017-06-01 DIAGNOSIS — Z1211 Encounter for screening for malignant neoplasm of colon: Secondary | ICD-10-CM | POA: Insufficient documentation

## 2017-06-01 DIAGNOSIS — D124 Benign neoplasm of descending colon: Secondary | ICD-10-CM | POA: Diagnosis not present

## 2017-06-01 DIAGNOSIS — K635 Polyp of colon: Secondary | ICD-10-CM | POA: Diagnosis not present

## 2017-06-01 HISTORY — PX: COLONOSCOPY WITH PROPOFOL: SHX5780

## 2017-06-01 SURGERY — COLONOSCOPY WITH PROPOFOL
Anesthesia: General

## 2017-06-01 MED ORDER — LIDOCAINE 2% (20 MG/ML) 5 ML SYRINGE
INTRAMUSCULAR | Status: DC | PRN
  Administered 2017-06-01: 40 mg via INTRAVENOUS

## 2017-06-01 MED ORDER — ONDANSETRON HCL 4 MG/2ML IJ SOLN: 4.0000 mg | Freq: Once | INTRAMUSCULAR | Status: DC | PRN

## 2017-06-01 MED ORDER — FENTANYL CITRATE (PF) 100 MCG/2ML IJ SOLN: 25.0000 ug | INTRAMUSCULAR | Status: DC | PRN

## 2017-06-01 MED ORDER — FENTANYL CITRATE (PF) 100 MCG/2ML IJ SOLN
INTRAMUSCULAR | Status: DC | PRN
  Administered 2017-06-01: 50 ug via INTRAVENOUS

## 2017-06-01 MED ORDER — PROPOFOL 10 MG/ML IV BOLUS
INTRAVENOUS | Status: DC | PRN
  Administered 2017-06-01: 100 mg via INTRAVENOUS

## 2017-06-01 MED ORDER — SODIUM CHLORIDE 0.9 % IV SOLN: INTRAVENOUS | Status: DC

## 2017-06-01 MED ORDER — GLYCOPYRROLATE 0.2 MG/ML IJ SOLN
INTRAMUSCULAR | Status: DC | PRN
  Administered 2017-06-01: 0.2 mg via INTRAVENOUS

## 2017-06-01 MED ORDER — EPHEDRINE SULFATE 50 MG/ML IJ SOLN
INTRAMUSCULAR | Status: DC | PRN
  Administered 2017-06-01 (×2): 10 mg via INTRAVENOUS

## 2017-06-01 MED ORDER — PROPOFOL 500 MG/50ML IV EMUL
INTRAVENOUS | Status: AC
  Filled 2017-06-01: qty 50

## 2017-06-01 MED ORDER — GLYCOPYRROLATE 0.2 MG/ML IJ SOLN
INTRAMUSCULAR | Status: AC
  Filled 2017-06-01: qty 1

## 2017-06-01 MED ORDER — PROPOFOL 500 MG/50ML IV EMUL
INTRAVENOUS | Status: DC | PRN
  Administered 2017-06-01: 140 ug/kg/min via INTRAVENOUS

## 2017-06-01 MED ORDER — PHENYLEPHRINE HCL 10 MG/ML IJ SOLN
INTRAMUSCULAR | Status: DC | PRN
  Administered 2017-06-01 (×3): 100 ug via INTRAVENOUS

## 2017-06-01 MED ORDER — SODIUM CHLORIDE 0.9 % IV SOLN
INTRAVENOUS | Status: DC | PRN
  Administered 2017-06-01: 10:00:00 via INTRAVENOUS
  Administered 2017-06-01: 1000 mL

## 2017-06-01 MED ORDER — FENTANYL CITRATE (PF) 100 MCG/2ML IJ SOLN
INTRAMUSCULAR | Status: AC
  Filled 2017-06-01: qty 2

## 2017-06-01 NOTE — Op Note (Signed)
[facility] Gastroenterology Patient Name: Lindsey Harmon Procedure Date: 06/01/2017 9:40 AM MRN: [id number] Account #: [id number] Date of Birth: May 17, 1946 Admit Type: Outpatient Age: 72 Room: [facility] ENDO ROOM 1 Gender: Female Note Status: Finalized Procedure:            Colonoscopy Indications:          High risk colon cancer surveillance: Personal history                        of colonic polyps Providers:            Robert Bellow, MD Referring MD:         Einar Pheasant, MD (Referring MD) Medicines:            Monitored Anesthesia Care Complications:        No immediate complications. Procedure:            Pre-Anesthesia Assessment:                       - Prior to the procedure, a History and Physical was                        performed, and patient medications, allergies and                        sensitivities were reviewed. The patient's tolerance of                        previous anesthesia was reviewed.                       - The risks and benefits of the procedure and the                        sedation options and risks were discussed with the                        patient. All questions were answered and informed                        consent was obtained.                       After obtaining informed consent, the colonoscope was                        passed under direct vision. Throughout the procedure,                        the patient's blood pressure, pulse, and oxygen                        saturations were monitored continuously. The                        Colonoscope was introduced through the anus and                        advanced to the the cecum, identified by appendiceal  orifice and ileocecal valve. The colonoscopy was                        performed with difficulty due to restricted mobility of                        the colon. Successful completion of the procedure was                        aided by  changing the patient to a supine position. The                        patient tolerated the procedure well. The quality of                        the bowel preparation was excellent. Findings:      Two sessile polyps were found in the mid descending colon and distal       transverse colon. The polyps were 5 to 7 mm in size. These were biopsied       with a cold large-capacity forceps for histology.      The retroflexed view of the distal rectum and anal verge was normal and       showed no anal or rectal abnormalities.      Many medium-mouthed diverticula were found in the sigmoid colon. Impression:           - Two 5 to 7 mm polyps in the mid descending colon and                        in the distal transverse colon. Biopsied.                       - The distal rectum and anal verge are normal on                        retroflexion view. Recommendation:       - Repeat colonoscopy in 3 years for surveillance. Procedure Code(s):    --- Professional ---                       [id number], Colonoscopy, flexible; with biopsy, single or                        multiple Diagnosis Code(s):    --- Professional ---                       Z86.010, Personal history of colonic polyps                       D12.4, Benign neoplasm of descending colon                       D12.3, Benign neoplasm of transverse colon (hepatic                        flexure or splenic flexure) CPT copyright 2016 American Medical Association. All rights reserved. The codes documented in this report are preliminary and upon coder review may  be revised to meet current compliance requirements. Robert Bellow, MD 06/01/2017 10:24:28 AM  This report has been signed electronically. Number of Addenda: 0 Note Initiated On: 06/01/2017 9:40 AM Scope Withdrawal Time: 0 hours 10 minutes 35 seconds  Total Procedure Duration: 0 hours 24 minutes 10 seconds       [facility]

## 2017-06-01 NOTE — H&P (Signed)
Lindsey Harmon [id number] 1945-07-06     HPI: 72 year old woman now 42-month endoscopic resection of a large tubulovillous polyp of the proximal descending colon.  Did experience some nausea and an episode of vomiting with the prep. Reports the bowel movements are clear.  Repeat endoscopy to assess for residual polypoid tissue.  Multiple polyps were identified in the descending colon  Medications Prior to Admission  Medication Sig Dispense Refill Last Dose  . bisoprolol (ZEBETA) 5 MG tablet TAKE 1 TABLET BY MOUTH DAILY. 90 tablet 2 06/01/2017 at 0600  . Calcium Carbonate-Vitamin D (CALTRATE 600+D PO) Take by mouth.   05/31/2017 at Unknown time  . cephALEXin (KEFLEX) 250 MG capsule Take 250 mg by mouth daily.    05/31/2017 at Unknown time  . hydrochlorothiazide (HYDRODIURIL) 25 MG tablet TAKE 1 TABLET BY MOUTH EVERY DAY 90 tablet 1 05/31/2017 at Unknown time  . losartan (COZAAR) 100 MG tablet TAKE 1 TABLET BY MOUTH EVERY DAY 90 tablet 1 06/01/2017 at 0600  . Omega-3 Fatty Acids (FISH OIL) 1200 MG CAPS Take by mouth daily.   Past Week at Unknown time  . omeprazole (PRILOSEC) 20 MG capsule TAKE 1 CAPSULE (20 MG TOTAL) BY MOUTH 2 (TWO) TIMES DAILY. 180 capsule 1 05/31/2017 at Unknown time  . sertraline (ZOLOFT) 50 MG tablet TAKE 1 AND 1/2 TABLETS EVERY DAY 135 tablet 1 05/31/2017 at Unknown time  . simvastatin (ZOCOR) 20 MG tablet TAKE 1 TABLET BY MOUTH EVERY DAY 90 tablet 1 05/31/2017 at Unknown time   Allergies  Allergen Reactions  . Ace Inhibitors Cough    cough   Past Medical History:  Diagnosis Date  . Abnormal liver function   . Cystocele, unspecified (CODE)   . Fibrocystic breast disease   . GERD (gastroesophageal reflux disease)   . Hypertension   . Intrinsic sphincter deficiency   . Pelvic relaxation   . Pure hypercholesterolemia   . Rectocele   . SUI (stress urinary incontinence, female)   . Urinary retention   . Urinary retention    Past Surgical History:  Procedure Laterality  Date  . ABDOMINAL HYSTERECTOMY    . APPENDECTOMY    . Bladder Tack    . burch urethropexy  10/24/2000   laparoscopic colpopexy/culdoplasty  . COLONOSCOPY WITH PROPOFOL N/A 08/10/2016   Procedure: COLONOSCOPY WITH PROPOFOL;  Surgeon: Lollie Sails, MD;  Location: [facility] ENDOSCOPY;  Service: Endoscopy;  Laterality: N/A;  . COLONOSCOPY WITH PROPOFOL N/A 10/20/2016   Procedure: COLONOSCOPY WITH PROPOFOL;  Surgeon: Robert Bellow, MD;  Location: [facility] ENDOSCOPY;  Service: Endoscopy;  Laterality: N/A;  . COLPORRHAPHY     forv repair cystocele anterior  . TRANSVAGINAL TAPE (TVT) REMOVAL     Social History   Socioeconomic History  . Marital status: Married    Spouse name: Not on file  . Number of children: Not on file  . Years of education: Not on file  . Highest education level: Not on file  Social Needs  . Financial resource strain: Not on file  . Food insecurity - worry: Not on file  . Food insecurity - inability: Not on file  . Transportation needs - medical: Not on file  . Transportation needs - non-medical: Not on file  Occupational History  . Not on file  Tobacco Use  . Smoking status: Never Smoker  . Smokeless tobacco: Never Used  Substance and Sexual Activity  . Alcohol use: No    Alcohol/week: 0.0 oz  . Drug use:  No  . Sexual activity: Not on file  Other Topics Concern  . Not on file  Social History Narrative  . Not on file   Social History   Social History Narrative  . Not on file     ROS: Negative.     PE: HEENT: Negative. Lungs: Clear. Cardio: RR.  Assessment/Plan:  Proceed with planned endoscopy.  [facility] 06/01/2017

## 2017-06-01 NOTE — Anesthesia Post-op Follow-up Note (Signed)
Anesthesia QCDR form completed.        

## 2017-06-01 NOTE — Anesthesia Postprocedure Evaluation (Signed)
Anesthesia Post Note  Patient: Lindsey Harmon  Procedure(s) Performed: COLONOSCOPY WITH PROPOFOL (N/A )  Patient location during evaluation: PACU Anesthesia Type: General Level of consciousness: awake and alert and oriented Pain management: pain level controlled Vital Signs Assessment: post-procedure vital signs reviewed and stable Respiratory status: spontaneous breathing Cardiovascular status: blood pressure returned to baseline Anesthetic complications: no     Last Vitals:  Vitals:   06/01/17 1045 06/01/17 1055  BP: (!) 113/56 (!) 107/59  Pulse: 67 62  Resp: 19 17  Temp:    SpO2: 98% 99%    Last Pain:  Vitals:   06/01/17 1024  TempSrc: Tympanic                 Penni Penado

## 2017-06-01 NOTE — Anesthesia Preprocedure Evaluation (Signed)
Anesthesia Evaluation  Patient identified by MRN, date of birth, ID band Patient awake    Reviewed: Allergy & Precautions, NPO status , Patient's Chart, lab work & pertinent test results, reviewed documented beta blocker date and time   Airway Mallampati: II       Dental no notable dental hx.    Pulmonary neg pulmonary ROS,    Pulmonary exam normal        Cardiovascular hypertension, Pt. on medications and Pt. on home beta blockers Normal cardiovascular exam     Neuro/Psych negative neurological ROS     GI/Hepatic Neg liver ROS, GERD  Medicated and Controlled,  Endo/Other  negative endocrine ROS  Renal/GU Renal disease  negative genitourinary   Musculoskeletal negative musculoskeletal ROS (+)   Abdominal Normal abdominal exam  (+)   Peds negative pediatric ROS (+)  Hematology negative hematology ROS (+)   Anesthesia Other Findings   Reproductive/Obstetrics                             Anesthesia Physical  Anesthesia Plan  ASA: III  Anesthesia Plan: General   Post-op Pain Management:    Induction: Intravenous  PONV Risk Score and Plan:   Airway Management Planned: Nasal Cannula  Additional Equipment:   Intra-op Plan:   Post-operative Plan:   Informed Consent: I have reviewed the patients History and Physical, chart, labs and discussed the procedure including the risks, benefits and alternatives for the proposed anesthesia with the patient or authorized representative who has indicated his/her understanding and acceptance.     Plan Discussed with: CRNA and Surgeon  Anesthesia Plan Comments:         Anesthesia Quick Evaluation

## 2017-06-01 NOTE — Transfer of Care (Signed)
Immediate Anesthesia Transfer of Care Note  Patient: Lindsey Harmon  Procedure(s) Performed: COLONOSCOPY WITH PROPOFOL (N/A )  Patient Location: PACU and Endoscopy Unit  Anesthesia Type:General  Level of Consciousness: sedated  Airway & Oxygen Therapy: Patient Spontanous Breathing and Patient connected to nasal cannula oxygen  Post-op Assessment: Report given to RN  Post vital signs: Reviewed and stable  Last Vitals:  Vitals:   06/01/17 0929  BP: (!) 138/58  Pulse: (!) 58  Resp: 16  Temp: (!) 35.8 C  SpO2: 96%    Last Pain:  Vitals:   06/01/17 0929  TempSrc: Tympanic         Complications: No apparent anesthesia complications

## 2017-06-02 ENCOUNTER — Encounter: Payer: Self-pay | Admitting: General Surgery

## 2017-06-02 LAB — SURGICAL PATHOLOGY

## 2017-06-03 ENCOUNTER — Telehealth: Payer: Self-pay

## 2017-06-03 ENCOUNTER — Other Ambulatory Visit: Payer: Self-pay | Admitting: Internal Medicine

## 2017-06-03 NOTE — Telephone Encounter (Signed)
Notified patient as instructed, patient pleased. Discussed follow-up appointments, patient agrees  

## 2017-06-03 NOTE — Telephone Encounter (Signed)
-----   Message from Robert Bellow, MD sent at 06/03/2017 10:17 AM EST ----- Please notify path OK. Should be in recalls for f/u in three years.  ----- Message ----- From: Interface, Lab In Three Zero One Sent: 06/02/2017   8:38 PM To: Robert Bellow, MD

## 2017-06-27 ENCOUNTER — Other Ambulatory Visit: Payer: Self-pay | Admitting: Internal Medicine

## 2017-09-20 ENCOUNTER — Other Ambulatory Visit: Payer: Self-pay | Admitting: Internal Medicine

## 2017-09-22 ENCOUNTER — Ambulatory Visit (INDEPENDENT_AMBULATORY_CARE_PROVIDER_SITE_OTHER): Payer: Medicare Other

## 2017-09-22 VITALS — BP 140/80 | HR 53 | Temp 98.4°F | Resp 14 | Ht 63.75 in | Wt 188.6 lb

## 2017-09-22 DIAGNOSIS — Z0000 Encounter for general adult medical examination without abnormal findings: Secondary | ICD-10-CM

## 2017-09-22 NOTE — Patient Instructions (Addendum)
Lindsey Harmon , Thank you for taking time to come for your Medicare Wellness Visit. I appreciate your ongoing commitment to your health goals. Please review the following plan we discussed and let me know if I can assist you in the future.   Follow up as needed.    Bring a copy of your [place] and/or Living Will to be scanned into chart.  Have a great day!  These are the goals we discussed: Goals    . Increase physical activity     Chair/standing exercises as demonstrated.  Build with repetitive movements.   Walk for exercise when able.        This is a list of the screening recommended for you and due dates:  Health Maintenance  Topic Date Due  .  Hepatitis C: One time screening is recommended by Center for Disease Control  (CDC) for  adults born from [birth year] through 1967.   1945/07/25  . Tetanus Vaccine  05/21/1962  . DEXA scan (bone density measurement)  05/21/2008  . Pneumonia vaccines (2 of 2 - PPSV23) 11/24/2017  . Flu Shot  12/15/2017  . Mammogram  05/31/2019  . Colon Cancer Screening  06/02/2027   Bone Densitometry Bone densitometry is an imaging test that uses a special X-ray to measure the amount of calcium and other minerals in your bones (bone density). This test is also known as a bone mineral density test or dual-energy X-ray absorptiometry (DXA). The test can measure bone density at your hip and your spine. It is similar to having a regular X-ray. You may have this test to:  Diagnose a condition that causes weak or thin bones (osteoporosis).  Predict your risk of a broken bone (fracture).  Determine how well osteoporosis treatment is working.  Tell a health care provider about:  Any allergies you have.  All medicines you are taking, including vitamins, herbs, eye drops, creams, and over-the-counter medicines.  Any problems you or family members have had with anesthetic medicines.  Any blood disorders you have.  Any surgeries you have  had.  Any medical conditions you have.  Possibility of pregnancy.  Any other medical test you had within the previous 14 days that used contrast material. What are the risks? Generally, this is a safe procedure. However, problems can occur and may include the following:  This test exposes you to a very small amount of radiation.  The risks of radiation exposure may be greater to unborn children.  What happens before the procedure?  Do not take any calcium supplements for 24 hours before having the test. You can otherwise eat and drink what you usually do.  Take off all metal jewelry, eyeglasses, dental appliances, and any other metal objects. What happens during the procedure?  You may lie on an exam table. There will be an X-ray generator below you and an imaging device above you.  Other devices, such as boxes or braces, may be used to position your body properly for the scan.  You will need to lie still while the machine slowly scans your body.  The images will show up on a computer monitor. What happens after the procedure? You may need more testing at a later time. This information is not intended to replace advice given to you by your health care provider. Make sure you discuss any questions you have with your health care provider. Document Released: 05/25/2004 Document Revised: 10/09/2015 Document Reviewed: 10/11/2013 Elsevier Interactive Patient Education  2018  [employer].

## 2017-09-22 NOTE — Progress Notes (Addendum)
Subjective:   Lindsey Harmon is a 72 y.o. female who presents for Medicare Annual (Subsequent) preventive examination.  Review of Systems:  No ROS.  Medicare Wellness Visit. Additional risk factors are reflected in the social history.  Cardiac Risk Factors include: advanced age (>50men, >9 women);hypertension;obesity (BMI >30kg/m2)     Objective:     Vitals: BP 140/80 (BP Location: Left Arm, Patient Position: Sitting, Cuff Size: Normal)   Pulse (!) 53   Temp 98.4 F (36.9 C) (Oral)   Resp 14   Ht 5' 3.75" (1.619 m)   Wt 188 lb 9.6 oz (85.5 kg)   SpO2 98%   BMI 32.63 kg/m   Body mass index is 32.63 kg/m.  Advanced Directives 09/22/2017 06/01/2017 10/20/2016 09/21/2016 08/10/2016 01/13/2016 09/09/2015  Does Patient Have a Medical Advance Directive? Yes Yes Yes Yes Yes Yes No  Type of Paramedic of [place];Living will [place];Living will Living will;Healthcare Power of Attorney Living will;Healthcare Power of [place];Living will [place];Living will -  Does patient want to make changes to medical advance directive? No - Patient declined - - No - Patient declined - - -  Copy of [place] in Chart? No - copy requested No - copy requested No - copy requested No - copy requested No - copy requested - -    Tobacco Social History   Tobacco Use  Smoking Status Never Smoker  Smokeless Tobacco Never Used     Counseling given: Not Answered   Clinical Intake:  Pre-visit preparation completed: Yes  Pain : No/denies pain                 Past Medical History:  Diagnosis Date  . Abnormal liver function   . Cystocele, unspecified (CODE)   . Fibrocystic breast disease   . GERD (gastroesophageal reflux disease)   . Hypertension   . Intrinsic sphincter deficiency   . Pelvic relaxation   . Pure hypercholesterolemia   . Rectocele   . SUI (stress urinary  incontinence, female)   . Urinary retention   . Urinary retention    Past Surgical History:  Procedure Laterality Date  . ABDOMINAL HYSTERECTOMY    . APPENDECTOMY    . Bladder Tack    . burch urethropexy  10/24/2000   laparoscopic colpopexy/culdoplasty  . COLONOSCOPY WITH PROPOFOL N/A 08/10/2016   Procedure: COLONOSCOPY WITH PROPOFOL;  Surgeon: Lollie Sails, MD;  Location: [facility] ENDOSCOPY;  Service: Endoscopy;  Laterality: N/A;  . COLONOSCOPY WITH PROPOFOL N/A 10/20/2016   Procedure: COLONOSCOPY WITH PROPOFOL;  Surgeon: Robert Bellow, MD;  Location: [facility] ENDOSCOPY;  Service: Endoscopy;  Laterality: N/A;  . COLONOSCOPY WITH PROPOFOL N/A 06/01/2017   Procedure: COLONOSCOPY WITH PROPOFOL;  Surgeon: Robert Bellow, MD;  Location: [facility] ENDOSCOPY;  Service: Endoscopy;  Laterality: N/A;  . COLPORRHAPHY     forv repair cystocele anterior  . TRANSVAGINAL TAPE (TVT) REMOVAL     Family History  Problem Relation Age of Onset  . Pneumonia Mother        died of presumed aspiration pneumonia  . Hypertension Mother   . Cancer Father        prostate  . Heart disease Paternal Grandfather        myocardial infarction  . Alzheimer's disease Brother    Social History   Socioeconomic History  . Marital status: Married    Spouse name: Not on file  .  Number of children: Not on file  . Years of education: Not on file  . Highest education level: Not on file  Occupational History  . Not on file  Social Needs  . Financial resource strain: Not hard at all  . Food insecurity:    Worry: Never true    Inability: Never true  . Transportation needs:    Medical: No    Non-medical: No  Tobacco Use  . Smoking status: Never Smoker  . Smokeless tobacco: Never Used  Substance and Sexual Activity  . Alcohol use: No    Alcohol/week: 0.0 oz  . Drug use: No  . Sexual activity: Not on file  Lifestyle  . Physical activity:    Days per week: Not on file    Minutes per session: Not on file  .  Stress: To some extent  Relationships  . Social connections:    Talks on phone: Not on file    Gets together: Not on file    Attends religious service: Not on file    Active member of club or organization: Not on file    Attends meetings of clubs or organizations: Not on file    Relationship status: Not on file  Other Topics Concern  . Not on file  Social History Narrative  . Not on file    Outpatient Encounter Medications as of 09/22/2017  Medication Sig  . bisoprolol (ZEBETA) 5 MG tablet TAKE 1 TABLET BY MOUTH EVERY DAY  . Calcium Carbonate-Vitamin D (CALTRATE 600+D PO) Take by mouth.  . cephALEXin (KEFLEX) 250 MG capsule Take 250 mg by mouth daily.   . hydrochlorothiazide (HYDRODIURIL) 25 MG tablet TAKE 1 TABLET BY MOUTH EVERY DAY  . losartan (COZAAR) 100 MG tablet TAKE 1 TABLET BY MOUTH EVERY DAY  . Omega-3 Fatty Acids (FISH OIL) 1200 MG CAPS Take by mouth daily.  Marland Kitchen omeprazole (PRILOSEC) 20 MG capsule TAKE 1 CAPSULE (20 MG TOTAL) BY MOUTH 2 (TWO) TIMES DAILY.  Marland Kitchen sertraline (ZOLOFT) 50 MG tablet TAKE 1 AND 1/2 TABLETS EVERY DAY  . simvastatin (ZOCOR) 20 MG tablet TAKE 1 TABLET BY MOUTH EVERY DAY   No facility-administered encounter medications on file as of 09/22/2017.     Activities of Daily Living In your present state of health, do you have any difficulty performing the following activities: 09/22/2017  Hearing? N  Vision? N  Difficulty concentrating or making decisions? N  Walking or climbing stairs? N  Dressing or bathing? N  Doing errands, shopping? N  Preparing Food and eating ? N  Using the Toilet? N  In the past six months, have you accidently leaked urine? N  Do you have problems with loss of bowel control? N  Managing your Medications? N  Managing your Finances? N  Housekeeping or managing your Housekeeping? N  Some recent data might be hidden    Patient Care Team: Einar Pheasant, MD as PCP - General (Internal Medicine)    Assessment:   This is a routine  wellness examination for Lindsey Harmon.  The goal of the wellness visit is to assist the patient how to close the gaps in care and create a preventative care plan for the patient.   The roster of all physicians providing medical care to patient is listed in the Snapshot section of the chart.  Taking calcium VIT D as appropriate/Osteoporosis risk reviewed.    Safety issues reviewed; Smoke and carbon monoxide detectors in the home. No firearms in the home. Wears seatbelts  when driving or riding with others. No violence in the home.  They do not have excessive sun exposure.  Discussed the need for sun protection: hats, long sleeves and the use of sunscreen if there is significant sun exposure.  Patient is alert, normal appearance, oriented to person/place/and time.  Correctly identified the president of the [place] and recalls of 5/5 words. Performs simple calculations and can read correct time from watch face. Displays appropriate judgement.  No new identified risk were noted.  No failures at ADL's or IADL's.    BMI- discussed the importance of a healthy diet, water intake and the benefits of aerobic exercise. Educational material provided.   24 hour diet recall: Regular diet  Dental- every 6 months.    Eye- Visual acuity not assessed per patient preference since they have regular follow up with the ophthalmologist.  Wears corrective lenses.  Sleep patterns- Sleeps through the night without issues.  Dexa Scan discussed.  Educational material provided.   Hepatitis C discussed. Educational material provided.   TDAP vaccine deferred per patient preference.  Follow up with insurance.  Educational material provided.  Patient Concerns: None at this time. Follow up with PCP as needed.  Exercise Activities and Dietary recommendations Current Exercise Habits: Home exercise routine, Type of exercise: stretching;Other - see comments(Standing exercises ), Time (Minutes): 10, Frequency (Times/Week):  7, Weekly Exercise (Minutes/Week): 70, Intensity: Moderate  Goals    . Increase physical activity     Chair/standing exercises as demonstrated.  Build with repetitive movements.   Walk for exercise when able.        Fall Risk Fall Risk  09/22/2017 05/27/2017 09/21/2016 05/23/2015 02/26/2014  Falls in the past year? No No No No Yes  Number falls in past yr: - - - - 1  Injury with Fall? - - - - Yes   Depression Screen PHQ 2/9 Scores 09/22/2017 05/27/2017 09/21/2016 05/23/2015  PHQ - 2 Score 0 0 0 0  PHQ- 9 Score - - 0 -     Cognitive Function MMSE - Mini Mental State Exam 09/22/2017 09/21/2016  Orientation to time 5 5  Orientation to Place 5 5  Registration 3 3  Attention/ Calculation 5 5  Recall 3 3  Language- name 2 objects 2 2  Language- repeat 1 1  Language- follow 3 step command 3 3  Language- read & follow direction 1 1  Write a sentence 1 1  Copy design 1 1  Total score 30 30        Immunization History  Administered Date(s) Administered  . Influenza, High Dose Seasonal PF 02/17/2016, 02/22/2017  . Pneumococcal Conjugate-13 11/24/2016   Screening Tests Health Maintenance  Topic Date Due  . Hepatitis C Screening  May 10, 1946  . TETANUS/TDAP  05/21/1962  . DEXA SCAN  05/21/2008  . PNA vac Low Risk Adult (2 of 2 - PPSV23) 11/24/2017  . INFLUENZA VACCINE  12/15/2017  . MAMMOGRAM  05/31/2019  . COLONOSCOPY  06/02/2027      Plan:    End of life planning; Advance aging; Advanced directives discussed. Copy of current HCPOA/Living Will requested.    I have personally reviewed and noted the following in the patient's chart:   . Medical and social history . Use of alcohol, tobacco or illicit drugs  . Current medications and supplements . Functional ability and status . Nutritional status . Physical activity . Advanced directives . List of other physicians . Hospitalizations, surgeries, and ER visits in previous 12 months .  Vitals . Screenings to include cognitive,  depression, and falls . Referrals and appointments  In addition, I have reviewed and discussed with patient certain preventive protocols, quality metrics, and best practice recommendations. A written personalized care plan for preventive services as well as general preventive health recommendations were provided to patient.     Varney Biles, LPN  07/23/4534    Reviewed above information.  Agree with assessment and plan.   Dr Nicki Reaper

## 2017-10-04 ENCOUNTER — Other Ambulatory Visit: Payer: Self-pay | Admitting: Internal Medicine

## 2017-10-24 ENCOUNTER — Telehealth: Payer: Self-pay

## 2017-10-24 NOTE — Telephone Encounter (Signed)
Copied from [facility] [id number]. Topic: Quick Communication - See Telephone Encounter >> Oct 24, 2017  2:23 PM Antonieta Iba C wrote: CRM for notification. See Telephone encounter for: 10/24/17.  Pt says that she was advised by the pharmacy that her medication bisoprolol (ZEBETA) 5 MG tablet is out of stock and they dont know when it will be back in stock. Pt says that pharmacy doesn't know another pharmacy that have medication in stock, pt would like to be advised further on what she should do?    CB: [id number] or [phone]

## 2017-10-25 ENCOUNTER — Other Ambulatory Visit: Payer: Self-pay

## 2017-10-25 MED ORDER — BISOPROLOL FUMARATE 5 MG PO TABS: 5.0000 mg | ORAL_TABLET | Freq: Every day | ORAL | 2 refills | Status: DC

## 2017-10-25 NOTE — Telephone Encounter (Signed)
Pt states [facility] DOES have this in stock, and they can have this in stock.  Pt states they  Are expecting you to call and verify this Rx. [facility] day [facility] Address: [address] [place], [place], [place] [address]  Phone: [phone]

## 2017-10-25 NOTE — Telephone Encounter (Signed)
Patient is calling [facility] to see if they have it in stock

## 2017-10-25 NOTE — Telephone Encounter (Signed)
rx sent in. Pt aware 

## 2017-11-21 ENCOUNTER — Other Ambulatory Visit (INDEPENDENT_AMBULATORY_CARE_PROVIDER_SITE_OTHER): Payer: Medicare Other

## 2017-11-21 DIAGNOSIS — R739 Hyperglycemia, unspecified: Secondary | ICD-10-CM | POA: Diagnosis not present

## 2017-11-21 DIAGNOSIS — I10 Essential (primary) hypertension: Secondary | ICD-10-CM | POA: Diagnosis not present

## 2017-11-21 DIAGNOSIS — R945 Abnormal results of liver function studies: Secondary | ICD-10-CM

## 2017-11-21 DIAGNOSIS — E7800 Pure hypercholesterolemia, unspecified: Secondary | ICD-10-CM | POA: Diagnosis not present

## 2017-11-21 LAB — LIPID PANEL
CHOL/HDL RATIO: 4
CHOLESTEROL: 155 mg/dL (ref 0–200)
HDL: 42.4 mg/dL (ref >39.00)
NONHDL: 112.94
Triglycerides: 222 mg/dL — ABNORMAL HIGH (ref 0.0–149.0)
VLDL: 44.4 mg/dL — ABNORMAL HIGH (ref 0.0–40.0)

## 2017-11-21 LAB — CBC WITH DIFFERENTIAL/PLATELET
BASOS ABS: 0.1 10*3/uL (ref 0.0–0.1)
BASOS PCT: 1 % (ref 0.0–3.0)
Eosinophils Absolute: 0.2 10*3/uL (ref 0.0–0.7)
Eosinophils Relative: 3.4 % (ref 0.0–5.0)
HCT: 38.9 % (ref 36.0–46.0)
Hemoglobin: 13.2 g/dL (ref 12.0–15.0)
Lymphocytes Relative: 28.5 % (ref 12.0–46.0)
Lymphs Abs: 1.7 10*3/uL (ref 0.7–4.0)
MCHC: 33.9 g/dL (ref 30.0–36.0)
MCV: 87.9 fl (ref 78.0–100.0)
MONOS PCT: 6.9 % (ref 3.0–12.0)
Monocytes Absolute: 0.4 10*3/uL (ref 0.1–1.0)
NEUTROS ABS: 3.6 10*3/uL (ref 1.4–7.7)
Neutrophils Relative %: 60.2 % (ref 43.0–77.0)
PLATELETS: 212 10*3/uL (ref 150.0–400.0)
RBC: 4.43 Mil/uL (ref 3.87–5.11)
RDW: 15.2 % (ref 11.5–15.5)
WBC: 6 10*3/uL (ref 4.0–10.5)

## 2017-11-21 LAB — BASIC METABOLIC PANEL
BUN: 15 mg/dL (ref 6–23)
CALCIUM: 9.5 mg/dL (ref 8.4–10.5)
CO2: 27 meq/L (ref 19–32)
CREATININE: 0.97 mg/dL (ref 0.40–1.20)
Chloride: 102 mEq/L (ref 96–112)
GFR: 59.58 mL/min — ABNORMAL LOW (ref >60.00)
Glucose, Bld: 105 mg/dL — ABNORMAL HIGH (ref 70–99)
Potassium: 3.7 mEq/L (ref 3.5–5.1)
Sodium: 138 mEq/L (ref 135–145)

## 2017-11-21 LAB — HEPATIC FUNCTION PANEL
ALT: 23 U/L (ref 0–35)
AST: 25 U/L (ref 0–37)
Albumin: 4.2 g/dL (ref 3.5–5.2)
Alkaline Phosphatase: 60 U/L (ref 39–117)
BILIRUBIN DIRECT: 0.1 mg/dL (ref 0.0–0.3)
Total Bilirubin: 0.7 mg/dL (ref 0.2–1.2)
Total Protein: 6.7 g/dL (ref 6.0–8.3)

## 2017-11-21 LAB — HEMOGLOBIN A1C: Hgb A1c MFr Bld: 6.3 % (ref 4.6–6.5)

## 2017-11-21 LAB — LDL CHOLESTEROL, DIRECT: Direct LDL: 87 mg/dL

## 2017-11-21 LAB — TSH: TSH: 3.8 u[IU]/mL (ref 0.35–4.50)

## 2017-11-22 ENCOUNTER — Encounter: Payer: Self-pay | Admitting: Internal Medicine

## 2017-11-23 ENCOUNTER — Ambulatory Visit: Payer: Medicare Other | Admitting: Internal Medicine

## 2017-11-23 ENCOUNTER — Encounter: Payer: Self-pay | Admitting: Internal Medicine

## 2017-11-23 DIAGNOSIS — Z860100 Personal history of colon polyps, unspecified: Secondary | ICD-10-CM

## 2017-11-23 DIAGNOSIS — I10 Essential (primary) hypertension: Secondary | ICD-10-CM | POA: Diagnosis not present

## 2017-11-23 DIAGNOSIS — R945 Abnormal results of liver function studies: Secondary | ICD-10-CM

## 2017-11-23 DIAGNOSIS — R739 Hyperglycemia, unspecified: Secondary | ICD-10-CM | POA: Diagnosis not present

## 2017-11-23 DIAGNOSIS — E7800 Pure hypercholesterolemia, unspecified: Secondary | ICD-10-CM

## 2017-11-23 DIAGNOSIS — F429 Obsessive-compulsive disorder, unspecified: Secondary | ICD-10-CM

## 2017-11-23 NOTE — Progress Notes (Signed)
Patient ID: Lindsey Harmon, female   DOB: January 21, 1946, 72 y.o.   MRN: [id number]   Subjective:    Patient ID: Lindsey Harmon, female    DOB: April 13, 1946, 72 y.o.   MRN: [id number]  HPI  Patient here for a scheduled follow up.  She reports she is doing relatively well.  Overall she feels she is handling stress well.  On zoloft.  Overall stable.  Tries to stay active.  No chest pain.  No sob.  No acid reflux.  No abdominal pain.  Bowels moving.  Outside blood pressure readings averaging 120-140/60-80.     Past Medical History:  Diagnosis Date  . Abnormal liver function   . Cystocele, unspecified (CODE)   . Fibrocystic breast disease   . GERD (gastroesophageal reflux disease)   . Hypertension   . Intrinsic sphincter deficiency   . Pelvic relaxation   . Pure hypercholesterolemia   . Rectocele   . SUI (stress urinary incontinence, female)   . Urinary retention   . Urinary retention    Past Surgical History:  Procedure Laterality Date  . ABDOMINAL HYSTERECTOMY    . APPENDECTOMY    . Bladder Tack    . burch urethropexy  10/24/2000   laparoscopic colpopexy/culdoplasty  . COLONOSCOPY WITH PROPOFOL N/A 08/10/2016   Procedure: COLONOSCOPY WITH PROPOFOL;  Surgeon: Lollie Sails, MD;  Location: [facility] ENDOSCOPY;  Service: Endoscopy;  Laterality: N/A;  . COLONOSCOPY WITH PROPOFOL N/A 10/20/2016   Procedure: COLONOSCOPY WITH PROPOFOL;  Surgeon: Robert Bellow, MD;  Location: [facility] ENDOSCOPY;  Service: Endoscopy;  Laterality: N/A;  . COLONOSCOPY WITH PROPOFOL N/A 06/01/2017   Procedure: COLONOSCOPY WITH PROPOFOL;  Surgeon: Robert Bellow, MD;  Location: [facility] ENDOSCOPY;  Service: Endoscopy;  Laterality: N/A;  . COLPORRHAPHY     forv repair cystocele anterior  . TRANSVAGINAL TAPE (TVT) REMOVAL     Family History  Problem Relation Age of Onset  . Pneumonia Mother        died of presumed aspiration pneumonia  . Hypertension Mother   . Cancer Father        prostate  . Heart disease  Paternal Grandfather        myocardial infarction  . Alzheimer's disease Brother    Social History   Socioeconomic History  . Marital status: Married    Spouse name: Not on file  . Number of children: Not on file  . Years of education: Not on file  . Highest education level: Not on file  Occupational History  . Not on file  Social Needs  . Financial resource strain: Not hard at all  . Food insecurity:    Worry: Never true    Inability: Never true  . Transportation needs:    Medical: No    Non-medical: No  Tobacco Use  . Smoking status: Never Smoker  . Smokeless tobacco: Never Used  Substance and Sexual Activity  . Alcohol use: No    Alcohol/week: 0.0 oz  . Drug use: No  . Sexual activity: Not on file  Lifestyle  . Physical activity:    Days per week: Not on file    Minutes per session: Not on file  . Stress: To some extent  Relationships  . Social connections:    Talks on phone: Not on file    Gets together: Not on file    Attends religious service: Not on file    Active member of club or organization: Not on file  Attends meetings of clubs or organizations: Not on file    Relationship status: Not on file  Other Topics Concern  . Not on file  Social History Narrative  . Not on file    Outpatient Encounter Medications as of 11/23/2017  Medication Sig  . bisoprolol (ZEBETA) 5 MG tablet Take 1 tablet (5 mg total) by mouth daily.  . Calcium Carbonate-Vitamin D (CALTRATE 600+D PO) Take by mouth.  . cephALEXin (KEFLEX) 250 MG capsule Take 250 mg by mouth daily.   . hydrochlorothiazide (HYDRODIURIL) 25 MG tablet TAKE 1 TABLET BY MOUTH EVERY DAY  . losartan (COZAAR) 100 MG tablet TAKE 1 TABLET BY MOUTH EVERY DAY  . Omega-3 Fatty Acids (FISH OIL) 1200 MG CAPS Take by mouth daily.  Marland Kitchen omeprazole (PRILOSEC) 20 MG capsule TAKE 1 CAPSULE BY MOUTH TWICE A DAY  . sertraline (ZOLOFT) 50 MG tablet TAKE 1 AND 1/2 TABLETS EVERY DAY  . simvastatin (ZOCOR) 20 MG tablet TAKE 1  TABLET BY MOUTH EVERY DAY   No facility-administered encounter medications on file as of 11/23/2017.     Review of Systems  Constitutional: Negative for appetite change and unexpected weight change.  HENT: Negative for congestion and sinus pressure.   Respiratory: Negative for cough, chest tightness and shortness of breath.   Cardiovascular: Negative for chest pain, palpitations and leg swelling.  Gastrointestinal: Negative for abdominal pain, diarrhea, nausea and vomiting.  Genitourinary: Negative for difficulty urinating and dysuria.  Musculoskeletal: Negative for joint swelling and myalgias.  Skin: Negative for color change and rash.  Neurological: Negative for dizziness, light-headedness and headaches.  Psychiatric/Behavioral: Negative for agitation and dysphoric mood.       Objective:    Physical Exam  Constitutional: She appears well-developed and well-nourished. No distress.  HENT:  Nose: Nose normal.  Mouth/Throat: Oropharynx is clear and moist.  Neck: Neck supple. No thyromegaly present.  Cardiovascular: Normal rate and regular rhythm.  Pulmonary/Chest: Breath sounds normal. No respiratory distress. She has no wheezes.  Abdominal: Soft. Bowel sounds are normal. There is no tenderness.  Musculoskeletal: She exhibits no edema or tenderness.  Lymphadenopathy:    She has no cervical adenopathy.  Skin: No rash noted. No erythema.  Psychiatric: She has a normal mood and affect. Her behavior is normal.    BP 128/78 (BP Location: Right Arm, Patient Position: Sitting, Cuff Size: Normal)   Pulse (!) 56   Ht 5' 3.25" (1.607 m)   Wt 186 lb 12.8 oz (84.7 kg)   SpO2 95%   BMI 32.83 kg/m  Wt Readings from Last 3 Encounters:  11/23/17 186 lb 12.8 oz (84.7 kg)  09/22/17 188 lb 9.6 oz (85.5 kg)  06/01/17 185 lb (83.9 kg)     Lab Results  Component Value Date   WBC 6.0 11/21/2017   HGB 13.2 11/21/2017   HCT 38.9 11/21/2017   PLT 212.0 11/21/2017   GLUCOSE 105 (H)  11/21/2017   CHOL 155 11/21/2017   TRIG 222.0 (H) 11/21/2017   HDL 42.40 11/21/2017   LDLDIRECT 87.0 11/21/2017   LDLCALC 84 05/25/2017   ALT 23 11/21/2017   AST 25 11/21/2017   NA 138 11/21/2017   K 3.7 11/21/2017   CL 102 11/21/2017   CREATININE 0.97 11/21/2017   BUN 15 11/21/2017   CO2 27 11/21/2017   TSH 3.80 11/21/2017   HGBA1C 6.3 11/21/2017    Mm Screening Breast Tomo Bilateral  Result Date: 05/30/2017 CLINICAL DATA:  Screening. EXAM: 2D DIGITAL SCREENING BILATERAL  MAMMOGRAM WITH 3D TOMO WITH CAD COMPARISON:  Previous exam(s). ACR Breast Density Category b: There are scattered areas of fibroglandular density. FINDINGS: There are no findings suspicious for malignancy. Images were processed with CAD. IMPRESSION: No mammographic evidence of malignancy. A result letter of this screening mammogram will be mailed directly to the patient. RECOMMENDATION: Screening mammogram in one year. (Code:SM-B-01Y) BI-RADS CATEGORY  1: Negative. Electronically Signed   By: Lajean Manes M.D.   On: 05/30/2017 10:56       Assessment & Plan:   Problem List Items Addressed This Visit    Abnormal liver function    Previously worked up by GI.  Felt to be due to fatty liver.  Recent liver panel wnl.  Follow.        Essential hypertension, benign    Blood pressure under good control.  Continue same medication regimen.  Follow pressures.  Follow metabolic panel.        Relevant Orders   Basic metabolic panel   History of colon polyps    Colonoscopy 06/01/17 - two polyps (tubular adenoma and hyperplastic polyp).        Hypercholesterolemia    On simvastatin.  Low cholesterol diet and exercise.  Follow lipid panel and liver function tests.        Relevant Orders   Hepatic function panel   Lipid panel   Hyperglycemia    Low carb diet and exercise.  Follow met b and a1c.        Relevant Orders   Hemoglobin A1c   Obsessive compulsive disorder    Overall she feels things are stable.  On  zoloft.  Does not feel needs anything more at this time.  Follow.           Einar Pheasant, MD

## 2017-12-02 ENCOUNTER — Encounter: Payer: Self-pay | Admitting: Internal Medicine

## 2017-12-02 NOTE — Assessment & Plan Note (Signed)
Low carb diet and exercise.  Follow met b and a1c.   

## 2017-12-02 NOTE — Assessment & Plan Note (Signed)
Colonoscopy 06/01/17 - two polyps (tubular adenoma and hyperplastic polyp).

## 2017-12-02 NOTE — Assessment & Plan Note (Signed)
Blood pressure under good control.  Continue same medication regimen.  Follow pressures.  Follow metabolic panel.   

## 2017-12-02 NOTE — Assessment & Plan Note (Signed)
Previously worked up by GI.  Felt to be due to fatty liver.  Recent liver panel wnl.  Follow.

## 2017-12-02 NOTE — Assessment & Plan Note (Signed)
On simvastatin.  Low cholesterol diet and exercise.  Follow lipid panel and liver function tests.   

## 2017-12-02 NOTE — Assessment & Plan Note (Signed)
Overall she feels things are stable.  On zoloft.  Does not feel needs anything more at this time.  Follow.

## 2017-12-18 ENCOUNTER — Other Ambulatory Visit: Payer: Self-pay | Admitting: Internal Medicine

## 2017-12-26 IMAGING — MG MM DIGITAL SCREENING BILAT W/ TOMO W/ CAD
8 of 13 series · 8 of 29 positions shown · non-contrast
Comparison: Previous exam(s).

CLINICAL DATA: Screening.

EXAM:
2D DIGITAL SCREENING BILATERAL MAMMOGRAM WITH CAD AND ADJUNCT TOMO

[R MLO (1 of 2)]
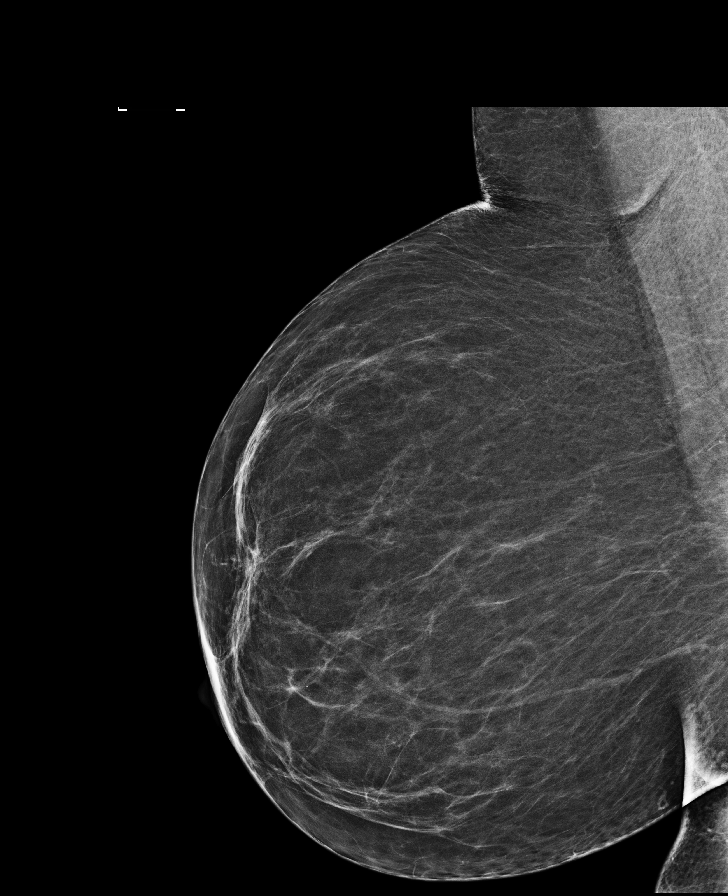

[L CC synth-2D]
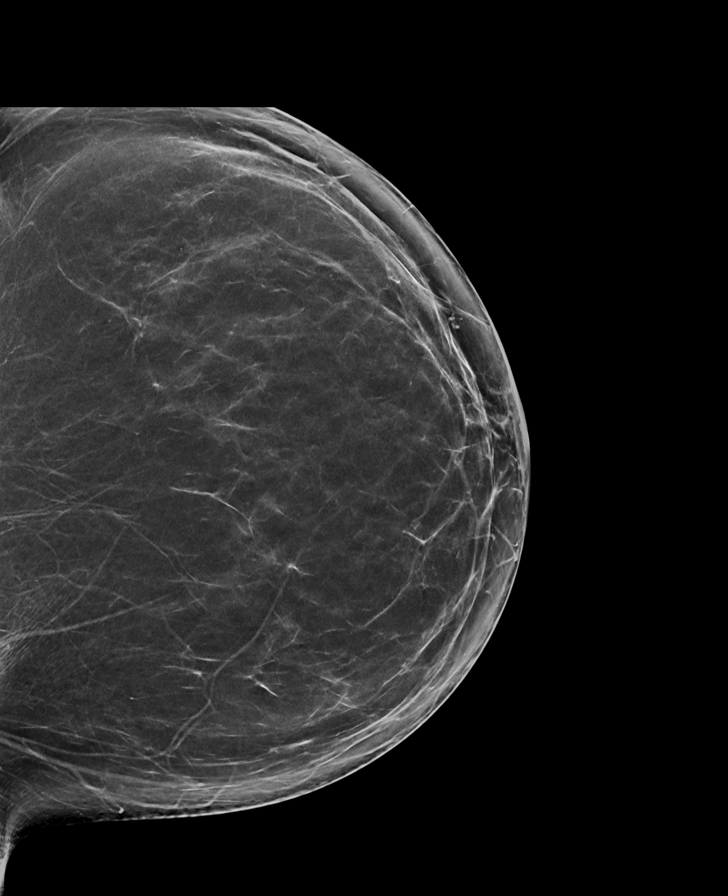

[R CC synth-2D]
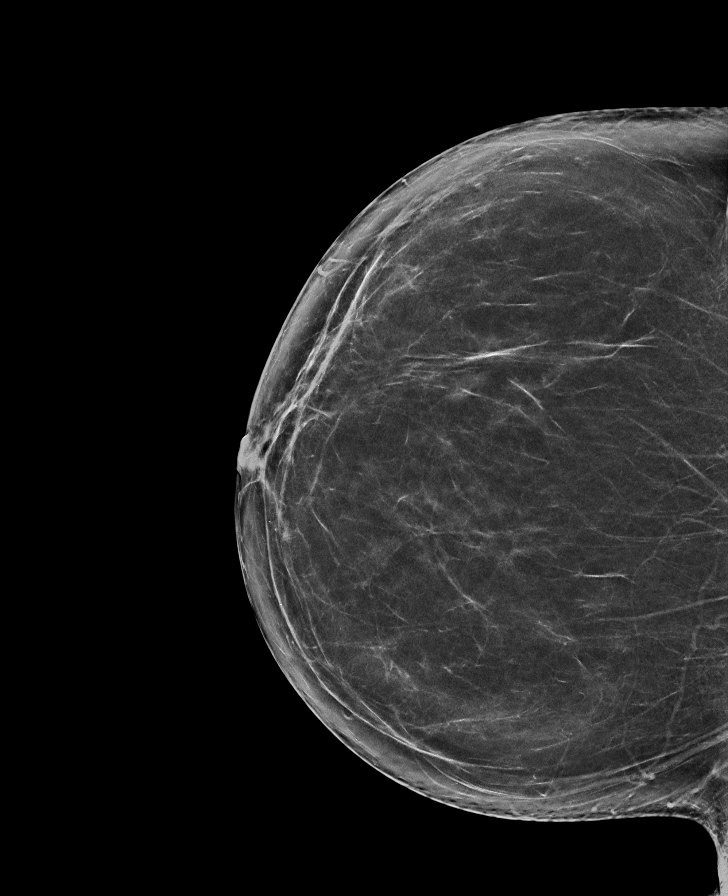

[R MLO synth-2D]
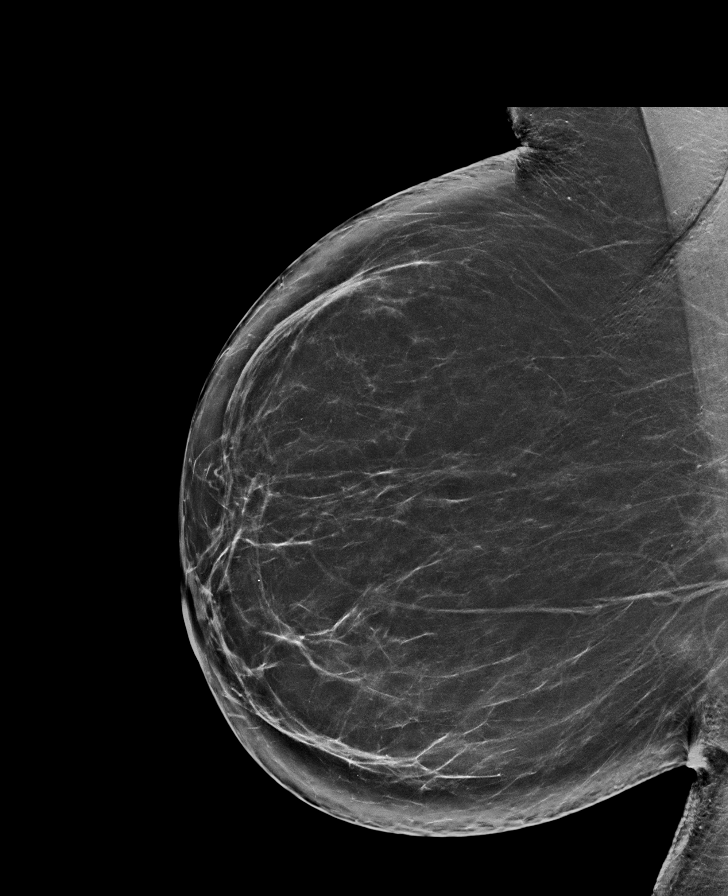

[L MLO synth-2D]
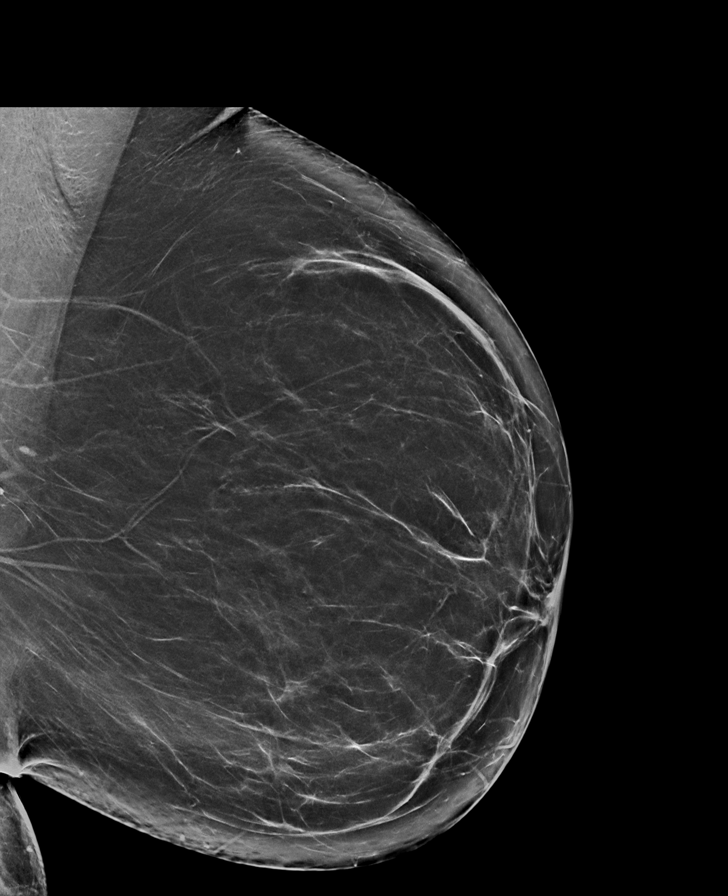

[L MLO]
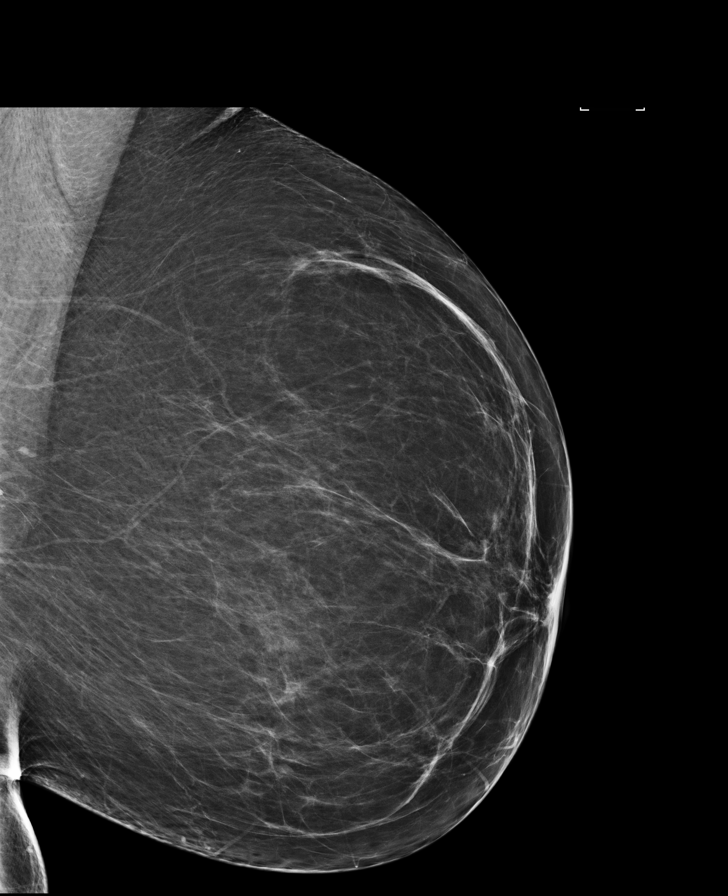

[R MLO (2 of 2)]
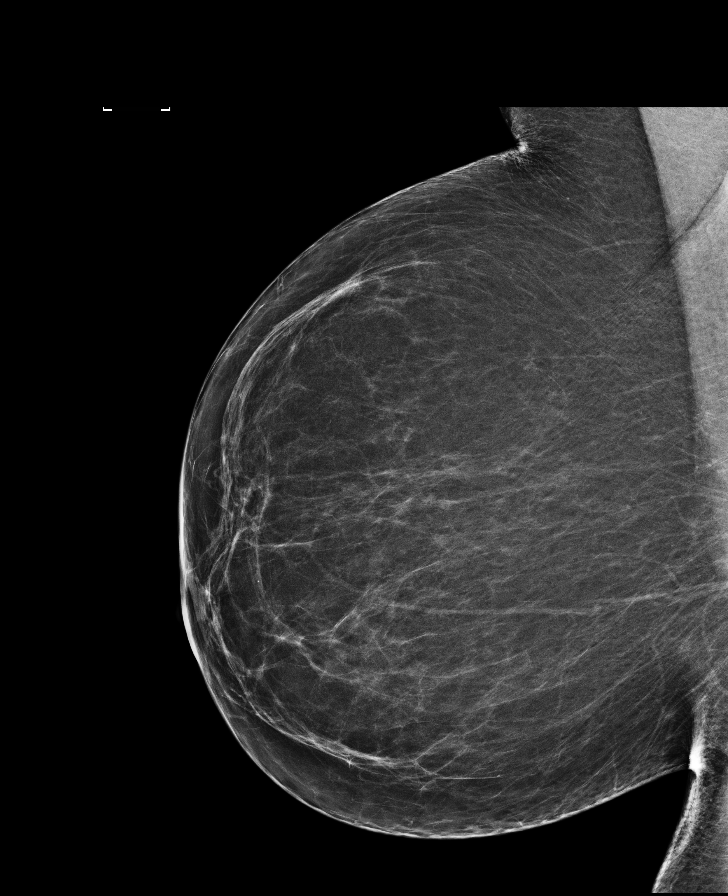

[L CC]
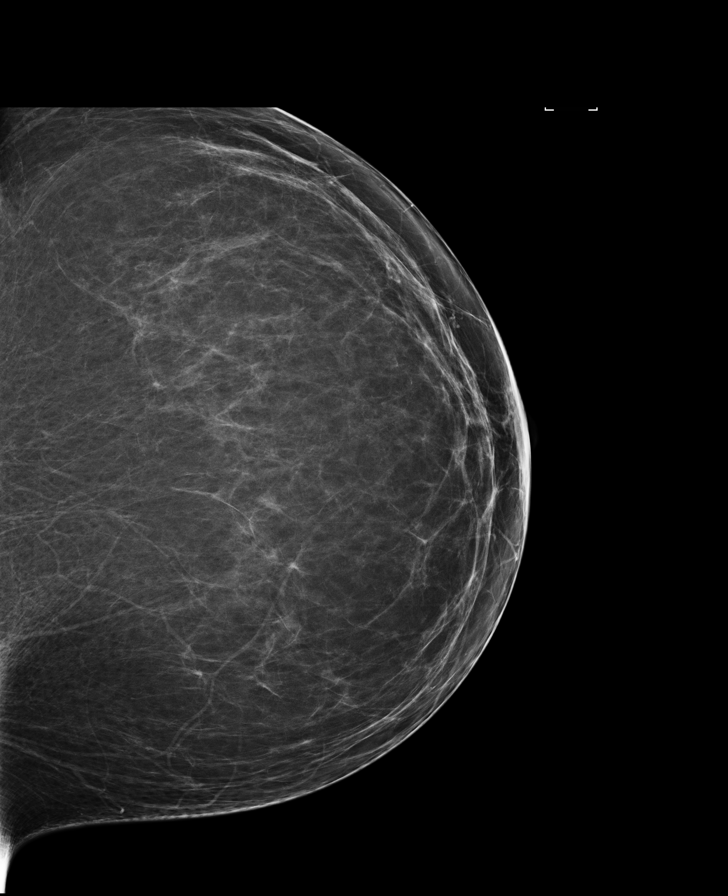

[8 of 29 positions shown; findings below may reference images not displayed]

ACR Breast Density Category b: There are scattered areas of
fibroglandular density.
FINDINGS: There are no findings suspicious for malignancy. Images were
processed with CAD.
IMPRESSION: No mammographic evidence of malignancy. A result letter of this
screening mammogram will be mailed directly to the patient.

RECOMMENDATION:
Screening mammogram in one year. (Code:97-6-RS4)

BI-RADS CATEGORY  1: Negative.

## 2018-02-28 ENCOUNTER — Ambulatory Visit (INDEPENDENT_AMBULATORY_CARE_PROVIDER_SITE_OTHER): Payer: Medicare Other

## 2018-02-28 DIAGNOSIS — Z23 Encounter for immunization: Secondary | ICD-10-CM

## 2018-02-28 NOTE — Progress Notes (Signed)
Pt was seen today for a NV and was given high dose flu shot in LD.   Pt tolerated well.

## 2018-03-13 ENCOUNTER — Other Ambulatory Visit: Payer: Self-pay | Admitting: Internal Medicine

## 2018-03-24 ENCOUNTER — Other Ambulatory Visit: Payer: Self-pay | Admitting: Internal Medicine

## 2018-04-20 ENCOUNTER — Encounter: Payer: Self-pay | Admitting: Internal Medicine

## 2018-05-03 ENCOUNTER — Ambulatory Visit: Payer: Self-pay | Admitting: Nurse Practitioner

## 2018-05-04 ENCOUNTER — Encounter: Payer: Self-pay | Admitting: Family Medicine

## 2018-05-04 ENCOUNTER — Ambulatory Visit: Payer: Medicare Other | Admitting: Family Medicine

## 2018-05-04 VITALS — BP 128/62 | HR 62 | Temp 97.8°F | Ht 63.0 in | Wt 185.6 lb

## 2018-05-04 DIAGNOSIS — N390 Urinary tract infection, site not specified: Secondary | ICD-10-CM

## 2018-05-04 DIAGNOSIS — K5900 Constipation, unspecified: Secondary | ICD-10-CM

## 2018-05-04 DIAGNOSIS — R350 Frequency of micturition: Secondary | ICD-10-CM

## 2018-05-04 DIAGNOSIS — R195 Other fecal abnormalities: Secondary | ICD-10-CM

## 2018-05-04 LAB — POCT URINALYSIS DIPSTICK
Bilirubin, UA: NEGATIVE
Glucose, UA: NEGATIVE
Ketones, UA: NEGATIVE
NITRITE UA: NEGATIVE
PROTEIN UA: NEGATIVE
RBC UA: NEGATIVE
Urobilinogen, UA: 0.2 E.U./dL
pH, UA: 6 (ref 5.0–8.0)

## 2018-05-04 MED ORDER — POLYETHYLENE GLYCOL 3350 17 GM/SCOOP PO POWD: 17.0000 g | Freq: Every day | ORAL | 1 refills | Status: DC

## 2018-05-04 MED ORDER — SULFAMETHOXAZOLE-TRIMETHOPRIM 800-160 MG PO TABS: 1.0000 | ORAL_TABLET | Freq: Two times a day (BID) | ORAL | 0 refills | Status: DC

## 2018-05-04 NOTE — Progress Notes (Signed)
Subjective:    Patient ID: Lindsey Harmon, female    DOB: 07-09-1945, 72 y.o.   MRN: [id number]  HPI   Presents to clinic c/o increased bladder pressure, lower ABD soreness, having to self cath more often.  Patient does take Keflex 250 mg once per day every day for UTI prophylaxis.  States her urologist in the past did advise her if she began to have some pain/UTI symptoms that she could take the dose twice a day, if she continued to have symptoms she was advised to go to her PCP.  Patient also reports a few episodes of mucus in the stool.  Patient states she will have a fairly normal-looking bowel movement, feels "a little constipated", but will notice some mucus on the stool.  Patient states she did have one instance of just mucus when she had a bowel movement last week.  Denies any diarrhea or blood in stool. Last colonoscopy 06/01/17.   Patient Active Problem List   Diagnosis Date Noted  . History of colon polyps 08/16/2016  . Colon cancer screening 05/23/2016  . Viral URI with cough 04/14/2015  . Elevated serum creatinine 02/04/2015  . Health care maintenance 10/14/2014  . Hyperglycemia 10/03/2014  . BMI 32.0-32.9,adult 06/02/2014  . Renal insufficiency 12/23/2013  . Toe pain 09/19/2013  . Bradycardia 06/03/2013  . History of frequent urinary tract infections 02/02/2013  . Obsessive compulsive disorder 02/02/2013  . Essential hypertension, benign 08/07/2012  . Hypercholesterolemia 08/07/2012  . Abnormal liver function 08/07/2012   Social History   Tobacco Use  . Smoking status: Never Smoker  . Smokeless tobacco: Never Used  Substance Use Topics  . Alcohol use: No    Alcohol/week: 0.0 standard drinks   Review of Systems  Constitutional: Negative for chills, fatigue and fever.  HENT: Negative for congestion, ear pain, sinus pain and sore throat.   Eyes: Negative.   Respiratory: Negative for cough, shortness of breath and wheezing.   Cardiovascular: Negative for chest  pain, palpitations and leg swelling.  Gastrointestinal: +lower ABD pain. Negative for diarrhea, nausea and vomiting. +mucus in stool.  Genitourinary: +dysuria, frequency and urgency.  Musculoskeletal: Negative for arthralgias and myalgias.  Skin: Negative for color change, pallor and rash.  Neurological: Negative for syncope, light-headedness and headaches.  Psychiatric/Behavioral: The patient is not nervous/anxious.       Objective:   Physical Exam   Constitutional: She appears well-developed and well-nourished. No distress. Non toxic.  HENT:  Head: Normocephalic and atraumatic.  Eyes: EOM are normal. No scleral icterus.  Neck: Normal range of motion. Neck supple. No tracheal deviation present.  Cardiovascular: Normal rate, regular rhythm and normal heart sounds.  Pulmonary/Chest: Effort normal and breath sounds normal. No respiratory distress. She has no wheezes. She has no rales.  Abdominal: Soft. Bowel sounds are normal. Mild suprapubic tenderness. Neurological: She is alert and oriented to person, place, and time.  Gait normal  Skin: Skin is warm and dry. No pallor.  Psychiatric: She has a normal mood and affect. Her behavior is normal. Thought content normal.   Nursing note and vitals reviewed.   Vitals:   05/04/18 1019  BP: 128/62  Pulse: 62  Temp: 97.8 F (36.6 C)  SpO2: 94%      Assessment & Plan:   UTI/frequency of urination - patient does have +2 leukocytes in urine sample, in combination with her symptoms and doing self catheterizations we will treat for UTI.  She will continue her daily Keflex 250  mg and she will also take Bactrim twice daily for 5 days.  Advised to increase water intake, avoid excess sugary and caffeinated beverages.  Also advised to be sure to follow sterile technique with self-catheterization and be sure to do good handwashing.  Constipation/mucus in stool - advised that some mucus in the stool can be normal from time to time.  Patient will  begin taking MiraLAX once per day for the next week, then go down to taking it every other or every third day.  Patient advised if the mucus in stool persist and she develops pain, blood in stool, diarrhea to let us know.  Patient will keep upcoming follow-up as scheduled in January.  Patient aware she will be contacted with results of her urine culture.

## 2018-05-04 NOTE — Patient Instructions (Addendum)
Mucus is a smooth, thick substance produced in many places throughout the body, including in the lining of the digestive tract. Mucus lubricates surfaces and allows materials to pass smoothly. Some amount of mucus in the stool is normal; however, significant amounts of mucus and mucus accompanied by diarrhea, pain or blood may signify an intestinal condition such as infection or inflammation.  Start with dose of miralax every day for 1 week, then can go to taking every other day or every 3rd day if you find stools too soft

## 2018-05-06 LAB — URINE CULTURE
MICRO NUMBER:: 91520619
SPECIMEN QUALITY: ADEQUATE

## 2018-05-30 ENCOUNTER — Other Ambulatory Visit (INDEPENDENT_AMBULATORY_CARE_PROVIDER_SITE_OTHER): Payer: Medicare Other

## 2018-05-30 DIAGNOSIS — I10 Essential (primary) hypertension: Secondary | ICD-10-CM | POA: Diagnosis not present

## 2018-05-30 DIAGNOSIS — R739 Hyperglycemia, unspecified: Secondary | ICD-10-CM | POA: Diagnosis not present

## 2018-05-30 DIAGNOSIS — E7800 Pure hypercholesterolemia, unspecified: Secondary | ICD-10-CM | POA: Diagnosis not present

## 2018-05-30 LAB — HEMOGLOBIN A1C: Hgb A1c MFr Bld: 6 % (ref 4.6–6.5)

## 2018-05-30 LAB — HEPATIC FUNCTION PANEL
ALK PHOS: 58 U/L (ref 39–117)
ALT: 20 U/L (ref 0–35)
AST: 19 U/L (ref 0–37)
Albumin: 4.1 g/dL (ref 3.5–5.2)
BILIRUBIN DIRECT: 0.1 mg/dL (ref 0.0–0.3)
Total Bilirubin: 0.7 mg/dL (ref 0.2–1.2)
Total Protein: 6.8 g/dL (ref 6.0–8.3)

## 2018-05-30 LAB — LIPID PANEL
CHOL/HDL RATIO: 3
CHOLESTEROL: 154 mg/dL (ref 0–200)
HDL: 44.5 mg/dL (ref >39.00)
NONHDL: 109.98
Triglycerides: 201 mg/dL — ABNORMAL HIGH (ref 0.0–149.0)
VLDL: 40.2 mg/dL — ABNORMAL HIGH (ref 0.0–40.0)

## 2018-05-30 LAB — BASIC METABOLIC PANEL
BUN: 23 mg/dL (ref 6–23)
CALCIUM: 9.6 mg/dL (ref 8.4–10.5)
CHLORIDE: 102 meq/L (ref 96–112)
CO2: 29 meq/L (ref 19–32)
CREATININE: 0.99 mg/dL (ref 0.40–1.20)
GFR: 58.11 mL/min — ABNORMAL LOW (ref >60.00)
Glucose, Bld: 91 mg/dL (ref 70–99)
Potassium: 3.8 mEq/L (ref 3.5–5.1)
Sodium: 139 mEq/L (ref 135–145)

## 2018-05-30 LAB — LDL CHOLESTEROL, DIRECT: Direct LDL: 83 mg/dL

## 2018-06-01 ENCOUNTER — Ambulatory Visit (INDEPENDENT_AMBULATORY_CARE_PROVIDER_SITE_OTHER): Payer: Medicare Other | Admitting: Internal Medicine

## 2018-06-01 ENCOUNTER — Encounter: Payer: Self-pay | Admitting: Internal Medicine

## 2018-06-01 VITALS — BP 122/70 | HR 61 | Temp 98.1°F | Wt 181.8 lb

## 2018-06-01 DIAGNOSIS — Z23 Encounter for immunization: Secondary | ICD-10-CM | POA: Diagnosis not present

## 2018-06-01 DIAGNOSIS — Z6832 Body mass index (BMI) 32.0-32.9, adult: Secondary | ICD-10-CM

## 2018-06-01 DIAGNOSIS — F429 Obsessive-compulsive disorder, unspecified: Secondary | ICD-10-CM

## 2018-06-01 DIAGNOSIS — R739 Hyperglycemia, unspecified: Secondary | ICD-10-CM

## 2018-06-01 DIAGNOSIS — I10 Essential (primary) hypertension: Secondary | ICD-10-CM

## 2018-06-01 DIAGNOSIS — Z1239 Encounter for other screening for malignant neoplasm of breast: Secondary | ICD-10-CM | POA: Diagnosis not present

## 2018-06-01 DIAGNOSIS — N390 Urinary tract infection, site not specified: Secondary | ICD-10-CM | POA: Diagnosis not present

## 2018-06-01 DIAGNOSIS — E7800 Pure hypercholesterolemia, unspecified: Secondary | ICD-10-CM

## 2018-06-01 DIAGNOSIS — Z87440 Personal history of urinary (tract) infections: Secondary | ICD-10-CM

## 2018-06-01 DIAGNOSIS — R945 Abnormal results of liver function studies: Secondary | ICD-10-CM | POA: Diagnosis not present

## 2018-06-01 LAB — URINALYSIS, ROUTINE W REFLEX MICROSCOPIC
Bilirubin Urine: NEGATIVE
Hgb urine dipstick: NEGATIVE
Ketones, ur: NEGATIVE
Leukocytes, UA: NEGATIVE
Nitrite: NEGATIVE
SPECIFIC GRAVITY, URINE: 1.02 (ref 1.000–1.030)
Total Protein, Urine: NEGATIVE
Urine Glucose: NEGATIVE
Urobilinogen, UA: 0.2 (ref 0.0–1.0)
pH: 6 (ref 5.0–8.0)

## 2018-06-01 NOTE — Progress Notes (Addendum)
Patient ID: Lindsey Harmon, female   DOB: 22-Feb-1946, 73 y.o.   MRN: [id number]   Subjective:    Patient ID: Lindsey Harmon, female    DOB: 1945-06-25, 73 y.o.   MRN: [id number]  HPI  Patient here for a scheduled follow up.  She reports she is doing relatively well.  Tries to stay active.  No chest pain.  No sob.  No acid reflux.  No abodminal pain.  Bowels moving.  Still doing self caths.  On low dose suppressive abx to prevent urinary tract infections.  Request to start getting refills here.  Was recently evaluated and diagnosed with uti.  Treated and then abx changed secondary to persistent symptoms.  Feels have resolved now.  Desires to have urine checked to confirm no infection.  Previously had diarrhea for a few days, but this has resolved.  Feels doing well on zoloft.     Past Medical History:  Diagnosis Date  . Abnormal liver function   . Cystocele, unspecified (CODE)   . Fibrocystic breast disease   . GERD (gastroesophageal reflux disease)   . Hypertension   . Intrinsic sphincter deficiency   . Pelvic relaxation   . Pure hypercholesterolemia   . Rectocele   . SUI (stress urinary incontinence, female)   . Urinary retention   . Urinary retention    Past Surgical History:  Procedure Laterality Date  . ABDOMINAL HYSTERECTOMY    . APPENDECTOMY    . Bladder Tack    . burch urethropexy  10/24/2000   laparoscopic colpopexy/culdoplasty  . COLONOSCOPY WITH PROPOFOL N/A 08/10/2016   Procedure: COLONOSCOPY WITH PROPOFOL;  Surgeon: Lollie Sails, MD;  Location: [facility] ENDOSCOPY;  Service: Endoscopy;  Laterality: N/A;  . COLONOSCOPY WITH PROPOFOL N/A 10/20/2016   Procedure: COLONOSCOPY WITH PROPOFOL;  Surgeon: Robert Bellow, MD;  Location: [facility] ENDOSCOPY;  Service: Endoscopy;  Laterality: N/A;  . COLONOSCOPY WITH PROPOFOL N/A 06/01/2017   Procedure: COLONOSCOPY WITH PROPOFOL;  Surgeon: Robert Bellow, MD;  Location: [facility] ENDOSCOPY;  Service: Endoscopy;  Laterality: N/A;  .  COLPORRHAPHY     forv repair cystocele anterior  . TRANSVAGINAL TAPE (TVT) REMOVAL     Family History  Problem Relation Age of Onset  . Pneumonia Mother        died of presumed aspiration pneumonia  . Hypertension Mother   . Cancer Father        prostate  . Heart disease Paternal Grandfather        myocardial infarction  . Alzheimer's disease Brother    Social History   Socioeconomic History  . Marital status: Married    Spouse name: Not on file  . Number of children: Not on file  . Years of education: Not on file  . Highest education level: Not on file  Occupational History  . Not on file  Social Needs  . Financial resource strain: Not hard at all  . Food insecurity:    Worry: Never true    Inability: Never true  . Transportation needs:    Medical: No    Non-medical: No  Tobacco Use  . Smoking status: Never Smoker  . Smokeless tobacco: Never Used  Substance and Sexual Activity  . Alcohol use: No    Alcohol/week: 0.0 standard drinks  . Drug use: No  . Sexual activity: Not on file  Lifestyle  . Physical activity:    Days per week: Not on file    Minutes per session: Not on  file  . Stress: To some extent  Relationships  . Social connections:    Talks on phone: Not on file    Gets together: Not on file    Attends religious service: Not on file    Active member of club or organization: Not on file    Attends meetings of clubs or organizations: Not on file    Relationship status: Not on file  Other Topics Concern  . Not on file  Social History Narrative  . Not on file    Outpatient Encounter Medications as of 06/01/2018  Medication Sig  . bisoprolol (ZEBETA) 5 MG tablet Take 1 tablet (5 mg total) by mouth daily.  . Calcium Carbonate-Vitamin D (CALTRATE 600+D PO) Take by mouth.  . cephALEXin (KEFLEX) 250 MG capsule Take 1 capsule (250 mg total) by mouth daily.  . clobetasol ointment (TEMOVATE) 5.03 % Apply 1 application topically 2 (two) times daily.  .  hydrochlorothiazide (HYDRODIURIL) 25 MG tablet TAKE 1 TABLET BY MOUTH EVERY DAY  . losartan (COZAAR) 100 MG tablet TAKE 1 TABLET BY MOUTH EVERY DAY  . Omega-3 Fatty Acids (FISH OIL) 1200 MG CAPS Take by mouth daily.  Marland Kitchen omeprazole (PRILOSEC) 20 MG capsule TAKE 1 CAPSULE BY MOUTH TWICE A DAY  . sertraline (ZOLOFT) 50 MG tablet TAKE 1 AND 1/2 TABLETS EVERY DAY  . simvastatin (ZOCOR) 20 MG tablet TAKE 1 TABLET BY MOUTH EVERY DAY  . [DISCONTINUED] cephALEXin (KEFLEX) 250 MG capsule Take 250 mg by mouth daily.   . [DISCONTINUED] polyethylene glycol powder (GLYCOLAX/MIRALAX) powder Take 17 g by mouth daily. (Patient not taking: Reported on 06/01/2018)  . [DISCONTINUED] sulfamethoxazole-trimethoprim (BACTRIM DS,SEPTRA DS) 800-160 MG tablet Take 1 tablet by mouth 2 (two) times daily. (Patient not taking: Reported on 06/01/2018)   No facility-administered encounter medications on file as of 06/01/2018.     Review of Systems  Constitutional: Negative for appetite change and unexpected weight change.  HENT: Negative for congestion and sinus pressure.   Respiratory: Negative for cough, chest tightness and shortness of breath.   Cardiovascular: Negative for chest pain, palpitations and leg swelling.  Gastrointestinal: Negative for abdominal pain, diarrhea, nausea and vomiting.  Genitourinary: Negative for dysuria.       Self caths.    Musculoskeletal: Negative for joint swelling and myalgias.  Skin: Negative for color change and rash.  Neurological: Negative for dizziness, light-headedness and headaches.  Psychiatric/Behavioral: Negative for agitation and dysphoric mood.       Objective:    Physical Exam Constitutional:      General: She is not in acute distress.    Appearance: Normal appearance.  HENT:     Nose: Nose normal. No congestion.     Mouth/Throat:     Pharynx: No oropharyngeal exudate or posterior oropharyngeal erythema.  Neck:     Musculoskeletal: Neck supple. No muscular  tenderness.     Thyroid: No thyromegaly.  Cardiovascular:     Rate and Rhythm: Normal rate and regular rhythm.  Pulmonary:     Effort: No respiratory distress.     Breath sounds: Normal breath sounds. No wheezing.  Abdominal:     General: Bowel sounds are normal.     Palpations: Abdomen is soft.     Tenderness: There is no abdominal tenderness.  Musculoskeletal:        General: No swelling or tenderness.  Lymphadenopathy:     Cervical: No cervical adenopathy.  Skin:    Findings: No erythema or rash.  Neurological:  Mental Status: She is alert.  Psychiatric:        Mood and Affect: Mood normal.        Behavior: Behavior normal.     BP 122/70   Pulse 61   Temp 98.1 F (36.7 C)   Wt 181 lb 12.8 oz (82.5 kg)   SpO2 95%   BMI 32.20 kg/m  Wt Readings from Last 3 Encounters:  06/01/18 181 lb 12.8 oz (82.5 kg)  05/04/18 185 lb 9.6 oz (84.2 kg)  11/23/17 186 lb 12.8 oz (84.7 kg)     Lab Results  Component Value Date   WBC 6.0 11/21/2017   HGB 13.2 11/21/2017   HCT 38.9 11/21/2017   PLT 212.0 11/21/2017   GLUCOSE 91 05/30/2018   CHOL 154 05/30/2018   TRIG 201.0 (H) 05/30/2018   HDL 44.50 05/30/2018   LDLDIRECT 83.0 05/30/2018   LDLCALC 84 05/25/2017   ALT 20 05/30/2018   AST 19 05/30/2018   NA 139 05/30/2018   K 3.8 05/30/2018   CL 102 05/30/2018   CREATININE 0.99 05/30/2018   BUN 23 05/30/2018   CO2 29 05/30/2018   TSH 3.80 11/21/2017   HGBA1C 6.0 05/30/2018    Mm Screening Breast Tomo Bilateral  Result Date: 05/30/2017 CLINICAL DATA:  Screening. EXAM: 2D DIGITAL SCREENING BILATERAL MAMMOGRAM WITH 3D TOMO WITH CAD COMPARISON:  Previous exam(s). ACR Breast Density Category b: There are scattered areas of fibroglandular density. FINDINGS: There are no findings suspicious for malignancy. Images were processed with CAD. IMPRESSION: No mammographic evidence of malignancy. A result letter of this screening mammogram will be mailed directly to the patient.  RECOMMENDATION: Screening mammogram in one year. (Code:SM-B-01Y) BI-RADS CATEGORY  1: Negative. Electronically Signed   By: Lajean Manes M.D.   On: 05/30/2017 10:56       Assessment & Plan:   Problem List Items Addressed This Visit    Abnormal liver function    Previously worked up by GI.  Felt to be due to fatty liver.  Recent liver function tests wnl.        BMI 32.0-32.9,adult    Discussed diet and exercise.  Follow.        Essential hypertension, benign    Blood pressure under good control.  Continue same medication regimen.  Follow pressures.  Follow metabolic panel.        Relevant Orders   CBC with Differential/Platelet   TSH   Basic metabolic panel   History of frequent urinary tract infections    Has been seeing gyn.  Self cath.  On low dose keflex - prophylaxis.  Recent uti.  Recheck urine today to confirm no infection.        Hypercholesterolemia    On simvastatin.  Low cholesterol diet and exercise.  Follow lipid panel and liver function tests.        Relevant Orders   Hepatic function panel   Lipid panel   Hyperglycemia    Low carb diet and exercise.  Follow met b and a1c.       Relevant Orders   Hemoglobin A1c   Obsessive compulsive disorder    On zoloft.  Feels things are stable.  Follow.        Other Visit Diagnoses    Breast cancer screening    -  Primary   Relevant Orders   MM 3D SCREEN BREAST BILATERAL   Urinary tract infection without hematuria, site unspecified       Relevant  Medications   cephALEXin (KEFLEX) 250 MG capsule   Other Relevant Orders   Urinalysis, Routine w reflex microscopic (Completed)   Urine Culture (Completed)   Need for 23-polyvalent pneumococcal polysaccharide vaccine       Relevant Orders   Pneumococcal polysaccharide vaccine 23-valent greater than or equal to 2yo subcutaneous/IM (Completed)      Addendum:  Due to problems with frequent urinary tract infections (N39.0) and urinary retention (R33.9), she has to  self cath 7x/day.     Einar Pheasant, MD

## 2018-06-03 LAB — URINE CULTURE
MICRO NUMBER:: 65090
SPECIMEN QUALITY:: ADEQUATE

## 2018-06-04 ENCOUNTER — Encounter: Payer: Self-pay | Admitting: Internal Medicine

## 2018-06-04 MED ORDER — CEPHALEXIN 250 MG PO CAPS: 250.0000 mg | ORAL_CAPSULE | Freq: Every day | ORAL | 3 refills | Status: DC

## 2018-06-04 NOTE — Assessment & Plan Note (Signed)
On zoloft.  Feels things are stable.  Follow.

## 2018-06-04 NOTE — Assessment & Plan Note (Signed)
Discussed diet and exercise.  Follow.  

## 2018-06-04 NOTE — Assessment & Plan Note (Signed)
On simvastatin.  Low cholesterol diet and exercise.  Follow lipid panel and liver function tests.   

## 2018-06-04 NOTE — Assessment & Plan Note (Signed)
Previously worked up by GI.  Felt to be due to fatty liver.  Recent liver function tests wnl.

## 2018-06-04 NOTE — Assessment & Plan Note (Signed)
Has been seeing gyn.  Self cath.  On low dose keflex - prophylaxis.  Recent uti.  Recheck urine today to confirm no infection.

## 2018-06-04 NOTE — Assessment & Plan Note (Signed)
Low carb diet and exercise.  Follow met b and a1c.  

## 2018-06-04 NOTE — Assessment & Plan Note (Signed)
Blood pressure under good control.  Continue same medication regimen.  Follow pressures.  Follow metabolic panel.   

## 2018-06-20 ENCOUNTER — Ambulatory Visit
Admission: RE | Admit: 2018-06-20 | Discharge: 2018-06-20 | Disposition: A | Discharge status: Home / Self Care | Payer: Medicare Other | Source: Ambulatory Visit | Attending: Internal Medicine | Admitting: Internal Medicine

## 2018-06-20 DIAGNOSIS — Z1231 Encounter for screening mammogram for malignant neoplasm of breast: Secondary | ICD-10-CM | POA: Insufficient documentation

## 2018-06-20 DIAGNOSIS — Z1239 Encounter for other screening for malignant neoplasm of breast: Secondary | ICD-10-CM

## 2018-06-23 ENCOUNTER — Other Ambulatory Visit: Payer: Self-pay | Admitting: Internal Medicine

## 2018-07-01 ENCOUNTER — Other Ambulatory Visit: Payer: Self-pay | Admitting: Internal Medicine

## 2018-08-21 ENCOUNTER — Other Ambulatory Visit: Payer: Self-pay | Admitting: Internal Medicine

## 2018-09-14 ENCOUNTER — Other Ambulatory Visit: Payer: Self-pay | Admitting: Internal Medicine

## 2018-09-21 ENCOUNTER — Telehealth: Payer: Self-pay | Admitting: Internal Medicine

## 2018-09-21 ENCOUNTER — Encounter: Payer: Self-pay | Admitting: Internal Medicine

## 2018-09-21 NOTE — Telephone Encounter (Signed)
Letter printed regarding her self catheterization.

## 2018-09-26 ENCOUNTER — Other Ambulatory Visit: Payer: Self-pay | Admitting: Internal Medicine

## 2018-09-26 ENCOUNTER — Telehealth: Payer: Self-pay | Admitting: *Deleted

## 2018-09-26 NOTE — Telephone Encounter (Signed)
Copied from [place] [id number]. Topic: General - Other >> Sep 26, 2018 11:07 AM Rayann Heman wrote: Orbie Hurst calling from [employer] states that she has not received office note from 06/01/18. She would like [place] to fax them over to [phone]. She states that she discussed this with CMA nina. Please advise

## 2018-09-26 NOTE — Telephone Encounter (Signed)
Note faxed.

## 2018-09-28 ENCOUNTER — Encounter: Payer: Self-pay | Admitting: Internal Medicine

## 2018-09-28 NOTE — Telephone Encounter (Signed)
Orbie Hurst is calling in stating the "To whom it may concern" needs to be taken out and that the format needs to be in a chart style. As well as needing the frequency of 7 times a day to meet the guidelines, sign and date.

## 2018-09-28 NOTE — Telephone Encounter (Signed)
We sent office note 06/01/18.  We also sent a letter stating that pt self caths 7x/day.  I am not sure that I understand what they are needing.  Please clarify.  If we need to schedule a virtual visit to have updated documentation and office note discussing - ok to schedule.

## 2018-09-29 NOTE — Telephone Encounter (Signed)
Ok. Thanks!

## 2018-09-29 NOTE — Telephone Encounter (Signed)
Letter faxed.

## 2018-09-29 NOTE — Telephone Encounter (Signed)
Letter printed and placed out for your signature

## 2018-09-29 NOTE — Telephone Encounter (Signed)
Spoke with medical supply company. They have notes. The letter is ok but needs to be written as "Addendum to chart note 06/01/18" instead of "to whom it may concern." I can rewrite letter and have you sign and date it to resend to them.

## 2018-09-29 NOTE — Telephone Encounter (Signed)
Addendum for letter completed.

## 2018-09-29 NOTE — Telephone Encounter (Signed)
Signed and placed in box.   

## 2018-10-04 NOTE — Telephone Encounter (Signed)
The letter needs to say addendum to chart  notes,instead of to whom it may concern.   A frequency is needed 7 times a day in the notes.   cb is [employer]  Fax [phone]

## 2018-10-04 NOTE — Telephone Encounter (Signed)
Addendum needed to note for up to 7 times per day. For cath supplies

## 2018-10-04 NOTE — Telephone Encounter (Signed)
I added the addendum to office note 06/01/18.  Will need to fax.  See phone note for fax number and information.

## 2018-10-10 NOTE — Telephone Encounter (Signed)
Faxed addended note to [facility]

## 2018-11-27 ENCOUNTER — Ambulatory Visit: Payer: Medicare Other | Admitting: Internal Medicine

## 2018-11-27 ENCOUNTER — Other Ambulatory Visit: Payer: Self-pay

## 2018-11-27 ENCOUNTER — Ambulatory Visit (INDEPENDENT_AMBULATORY_CARE_PROVIDER_SITE_OTHER): Payer: Medicare Other

## 2018-11-27 DIAGNOSIS — Z0000 Encounter for general adult medical examination without abnormal findings: Secondary | ICD-10-CM | POA: Diagnosis not present

## 2018-11-27 NOTE — Patient Instructions (Addendum)
  Lindsey Harmon , Thank you for taking time to come for your Medicare Wellness Visit. I appreciate your ongoing commitment to your health goals. Please review the following plan we discussed and let me know if I can assist you in the future.   These are the goals we discussed: Goals      Patient Stated   . DIET - EAT MORE FRUITS AND VEGETABLES (pt-stated)     Low cholesterol diet       This is a list of the screening recommended for you and due dates:  Health Maintenance  Topic Date Due  . DEXA scan (bone density measurement)  12/15/2018*  . Tetanus Vaccine  11/27/2019*  .  Hepatitis C: One time screening is recommended by Center for Disease Control  (CDC) for  adults born from [birth year] through 1967.   11/27/2019*  . Flu Shot  12/16/2018  . Colon Cancer Screening  06/02/2027  . Pneumonia vaccines  Completed  *Topic was postponed. The date shown is not the original due date.

## 2018-11-27 NOTE — Progress Notes (Signed)
Subjective:   Lindsey Harmon is a 73 y.o. female who presents for Medicare Annual (Subsequent) preventive examination.  Review of Systems:  No ROS.  Medicare Wellness Virtual Visit.  Visual/audio telehealth visit, UTA vital signs.   See social history for additional risk factors.   Cardiac Risk Factors include: advanced age (>77men, >78 women);hypertension     Objective:     Vitals: There were no vitals taken for this visit.  There is no height or weight on file to calculate BMI.  Advanced Directives 11/27/2018 09/22/2017 06/01/2017 10/20/2016 09/21/2016 08/10/2016 01/13/2016  Does Patient Have a Medical Advance Directive? Yes Yes Yes Yes Yes Yes Yes  Type of Paramedic of [place];Living will [place];Living will [place];Living will Living will;Healthcare Power of Attorney Living will;Healthcare Power of Ages;Living will [place];Living will  Does patient want to make changes to medical advance directive? No - Patient declined No - Patient declined - - No - Patient declined - -  Copy of [place] in Chart? No - copy requested No - copy requested No - copy requested No - copy requested No - copy requested No - copy requested -    Tobacco Social History   Tobacco Use  Smoking Status Never Smoker  Smokeless Tobacco Never Used     Counseling given: Not Answered   Clinical Intake:  Pre-visit preparation completed: Yes        Diabetes: No  How often do you need to have someone help you when you read instructions, pamphlets, or other written materials from your doctor or pharmacy?: 1 - Never        Past Medical History:  Diagnosis Date  . Abnormal liver function   . Cystocele, unspecified (CODE)   . Fibrocystic breast disease   . GERD (gastroesophageal reflux disease)   . Hypertension   . Intrinsic sphincter deficiency   . Pelvic  relaxation   . Pure hypercholesterolemia   . Rectocele   . SUI (stress urinary incontinence, female)   . Urinary retention   . Urinary retention    Past Surgical History:  Procedure Laterality Date  . ABDOMINAL HYSTERECTOMY    . APPENDECTOMY    . Bladder Tack    . burch urethropexy  10/24/2000   laparoscopic colpopexy/culdoplasty  . COLONOSCOPY WITH PROPOFOL N/A 08/10/2016   Procedure: COLONOSCOPY WITH PROPOFOL;  Surgeon: Lollie Sails, MD;  Location: [facility] ENDOSCOPY;  Service: Endoscopy;  Laterality: N/A;  . COLONOSCOPY WITH PROPOFOL N/A 10/20/2016   Procedure: COLONOSCOPY WITH PROPOFOL;  Surgeon: Robert Bellow, MD;  Location: [facility] ENDOSCOPY;  Service: Endoscopy;  Laterality: N/A;  . COLONOSCOPY WITH PROPOFOL N/A 06/01/2017   Procedure: COLONOSCOPY WITH PROPOFOL;  Surgeon: Robert Bellow, MD;  Location: [facility] ENDOSCOPY;  Service: Endoscopy;  Laterality: N/A;  . COLPORRHAPHY     forv repair cystocele anterior  . TRANSVAGINAL TAPE (TVT) REMOVAL     Family History  Problem Relation Age of Onset  . Pneumonia Mother        died of presumed aspiration pneumonia  . Hypertension Mother   . Cancer Father        prostate  . Heart disease Paternal Grandfather        myocardial infarction  . Alzheimer's disease Brother    Social History   Socioeconomic History  . Marital status: Married    Spouse name: Not on file  . Number  of children: Not on file  . Years of education: Not on file  . Highest education level: Not on file  Occupational History  . Not on file  Social Needs  . Financial resource strain: Not hard at all  . Food insecurity    Worry: Never true    Inability: Never true  . Transportation needs    Medical: No    Non-medical: No  Tobacco Use  . Smoking status: Never Smoker  . Smokeless tobacco: Never Used  Substance and Sexual Activity  . Alcohol use: No    Alcohol/week: 0.0 standard drinks  . Drug use: No  . Sexual activity: Not on file  Lifestyle   . Physical activity    Days per week: 7 days    Minutes per session: 10 min  . Stress: To some extent  Relationships  . Social Herbalist on phone: Not on file    Gets together: Not on file    Attends religious service: Not on file    Active member of club or organization: Not on file    Attends meetings of clubs or organizations: Not on file    Relationship status: Not on file  Other Topics Concern  . Not on file  Social History Narrative  . Not on file    Outpatient Encounter Medications as of 11/27/2018  Medication Sig  . bisoprolol (ZEBETA) 5 MG tablet TAKE 1 TABLET BY MOUTH EVERY DAY  . Calcium Carbonate-Vitamin D (CALTRATE 600+D PO) Take by mouth.  . cephALEXin (KEFLEX) 250 MG capsule TAKE 1 CAPSULE (250 MG TOTAL) BY MOUTH DAILY.  . clobetasol ointment (TEMOVATE) 4.53 % Apply 1 application topically 2 (two) times daily.  . hydrochlorothiazide (HYDRODIURIL) 25 MG tablet TAKE 1 TABLET BY MOUTH EVERY DAY  . losartan (COZAAR) 100 MG tablet TAKE 1 TABLET BY MOUTH EVERY DAY  . Omega-3 Fatty Acids (FISH OIL) 1200 MG CAPS Take by mouth daily.  Marland Kitchen omeprazole (PRILOSEC) 20 MG capsule TAKE 1 CAPSULE BY MOUTH TWICE A DAY  . sertraline (ZOLOFT) 50 MG tablet TAKE 1 AND 1/2 TABLETS BY MOUTH EVERY DAY  . simvastatin (ZOCOR) 20 MG tablet TAKE 1 TABLET BY MOUTH EVERY DAY   No facility-administered encounter medications on file as of 11/27/2018.     Activities of Daily Living In your present state of health, do you have any difficulty performing the following activities: 11/27/2018  Hearing? N  Vision? N  Difficulty concentrating or making decisions? N  Walking or climbing stairs? N  Dressing or bathing? N  Doing errands, shopping? N  Preparing Food and eating ? N  Using the Toilet? N  In the past six months, have you accidently leaked urine? N  Do you have problems with loss of bowel control? N  Managing your Medications? N  Managing your Finances? N  Housekeeping or  managing your Housekeeping? N  Some recent data might be hidden    Patient Care Team: Einar Pheasant, MD as PCP - General (Internal Medicine)    Assessment:   This is a routine wellness examination for Baya.  I connected with patient 11/27/18 at  9:00 AM EDT by an audio enabled telemedicine application and verified that I am speaking with the correct person using two identifiers. Patient stated full name and DOB. Patient gave permission to continue with virtual visit. Patient's location was at home and Nurse's location was at [place] office.   Health Screenings  Mammogram - 06/2018 Colonoscopy -  05/2017 Bone Density - she plans to discuss with her doctor.  Glaucoma -none Hearing -demonstrates normal hearing during visit. Hemoglobin A1C - 05/2018 Cholesterol - 05/2018 Dental- visits every 6 months.  Vision- visits within the last 12 months.  Social  Alcohol intake - no       Smoking history- never    Smokers in home? none Illicit drug use? none Exercise - standing/chair exercise daily about 10 minutes Diet - regular BMI- discussed the importance of a healthy diet, water intake and the benefits of aerobic exercise.  Educational material provided.   Safety  Patient feels safe at home- yes Patient does have smoke detectors at home- yes Patient does wear sunscreen or protective clothing when in direct sunlight -yes Patient does wear seat belt when in a moving vehicle -yes Patient drives- yes  [id number] precautions and sickness symptoms discussed.   Activities of Daily Living Patient denies needing assistance with: driving, household chores, feeding themselves, getting from bed to chair, getting to the toilet, bathing/showering, dressing, managing money, or preparing meals.  No new identified risk were noted.    Depression Screen Patient denies losing interest in daily life, feeling hopeless, or crying easily over simple problems.   Medication-taking as directed and without  issues.   Fall Screen Patient denies being afraid of falling or falling in the last year.   Memory Screen Patient is alert.  Patient denies difficulty focusing, concentrating or misplacing items. Correctly identified the president of the [place], season and recall. Patient likes to read and complete word search puzzles for brain stimulation.  Immunizations The following Immunizations were discussed: Influenza, shingles, pneumonia, and tetanus.   Other Providers Patient Care Team: Einar Pheasant, MD as PCP - General (Internal Medicine)  Exercise Activities and Dietary recommendations Current Exercise Habits: Home exercise routine, Time (Minutes): 10(Standing/chair exercises), Frequency (Times/Week): 7, Weekly Exercise (Minutes/Week): 70, Intensity: Mild  Goals      Patient Stated   . DIET - EAT MORE FRUITS AND VEGETABLES (pt-stated)     Low cholesterol diet       Fall Risk Fall Risk  11/27/2018 09/22/2017 05/27/2017 09/21/2016 05/23/2015  Falls in the past year? 0 No No No No  Number falls in past yr: - - - - -  Injury with Fall? - - - - -   Depression Screen PHQ 2/9 Scores 11/27/2018 09/22/2017 05/27/2017 09/21/2016  PHQ - 2 Score 0 0 0 0  PHQ- 9 Score - - - 0     Cognitive Function MMSE - Mini Mental State Exam 09/22/2017 09/21/2016  Orientation to time 5 5  Orientation to Place 5 5  Registration 3 3  Attention/ Calculation 5 5  Recall 3 3  Language- name 2 objects 2 2  Language- repeat 1 1  Language- follow 3 step command 3 3  Language- read & follow direction 1 1  Write a sentence 1 1  Copy design 1 1  Total score 30 30     6CIT Screen 11/27/2018  What Year? 0 points  What month? 0 points  What time? 0 points  Count back from 20 0 points  Months in reverse 0 points  Repeat phrase 0 points  Total Score 0    Immunization History  Administered Date(s) Administered  . Influenza, High Dose Seasonal PF 02/17/2016, 02/22/2017, 02/28/2018  . Pneumococcal Conjugate-13  11/24/2016  . Pneumococcal Polysaccharide-23 06/01/2018   Screening Tests Health Maintenance  Topic Date Due  . DEXA SCAN  12/15/2018 (Originally  05/21/2008)  . TETANUS/TDAP  11/27/2019 (Originally 05/21/1962)  . Hepatitis C Screening  11/27/2019 (Originally 1945/12/20)  . INFLUENZA VACCINE  12/16/2018  . COLONOSCOPY  06/02/2027  . PNA vac Low Risk Adult  Completed      Plan:    End of life planning; Advance aging; Advanced directives discussed.  Copy of current HCPOA/Living Will requested.    I have personally reviewed and noted the following in the patient's chart:   . Medical and social history . Use of alcohol, tobacco or illicit drugs  . Current medications and supplements . Functional ability and status . Nutritional status . Physical activity . Advanced directives . List of other physicians . Hospitalizations, surgeries, and ER visits in previous 12 months . Vitals . Screenings to include cognitive, depression, and falls . Referrals and appointments  In addition, I have reviewed and discussed with patient certain preventive protocols, quality metrics, and best practice recommendations. A written personalized care plan for preventive services as well as general preventive health recommendations were provided to patient.     Varney Biles, LPN  3/64/3837   Reviewed above information.  Agree with assessment and plan.    Dr Nicki Reaper

## 2018-12-11 ENCOUNTER — Other Ambulatory Visit: Payer: Self-pay

## 2018-12-11 ENCOUNTER — Other Ambulatory Visit (INDEPENDENT_AMBULATORY_CARE_PROVIDER_SITE_OTHER): Payer: Medicare Other

## 2018-12-11 DIAGNOSIS — R739 Hyperglycemia, unspecified: Secondary | ICD-10-CM

## 2018-12-11 DIAGNOSIS — I10 Essential (primary) hypertension: Secondary | ICD-10-CM | POA: Diagnosis not present

## 2018-12-11 DIAGNOSIS — E7800 Pure hypercholesterolemia, unspecified: Secondary | ICD-10-CM

## 2018-12-11 LAB — HEPATIC FUNCTION PANEL
ALT: 22 U/L (ref 0–35)
AST: 20 U/L (ref 0–37)
Albumin: 4.4 g/dL (ref 3.5–5.2)
Alkaline Phosphatase: 64 U/L (ref 39–117)
Bilirubin, Direct: 0.2 mg/dL (ref 0.0–0.3)
Total Bilirubin: 0.9 mg/dL (ref 0.2–1.2)
Total Protein: 6.7 g/dL (ref 6.0–8.3)

## 2018-12-11 LAB — CBC WITH DIFFERENTIAL/PLATELET
Basophils Absolute: 0.1 10*3/uL (ref 0.0–0.1)
Basophils Relative: 1.3 % (ref 0.0–3.0)
Eosinophils Absolute: 0.2 10*3/uL (ref 0.0–0.7)
Eosinophils Relative: 2.3 % (ref 0.0–5.0)
HCT: 40.3 % (ref 36.0–46.0)
Hemoglobin: 13.5 g/dL (ref 12.0–15.0)
Lymphocytes Relative: 31.1 % (ref 12.0–46.0)
Lymphs Abs: 2.1 10*3/uL (ref 0.7–4.0)
MCHC: 33.5 g/dL (ref 30.0–36.0)
MCV: 88.8 fl (ref 78.0–100.0)
Monocytes Absolute: 0.4 10*3/uL (ref 0.1–1.0)
Monocytes Relative: 5.4 % (ref 3.0–12.0)
Neutro Abs: 4.1 10*3/uL (ref 1.4–7.7)
Neutrophils Relative %: 59.9 % (ref 43.0–77.0)
Platelets: 204 10*3/uL (ref 150.0–400.0)
RBC: 4.55 Mil/uL (ref 3.87–5.11)
RDW: 14.6 % (ref 11.5–15.5)
WBC: 6.9 10*3/uL (ref 4.0–10.5)

## 2018-12-11 LAB — BASIC METABOLIC PANEL
BUN: 20 mg/dL (ref 6–23)
CO2: 29 mEq/L (ref 19–32)
Calcium: 9.8 mg/dL (ref 8.4–10.5)
Chloride: 102 mEq/L (ref 96–112)
Creatinine, Ser: 1.08 mg/dL (ref 0.40–1.20)
GFR: 49.38 mL/min — ABNORMAL LOW (ref >60.00)
Glucose, Bld: 92 mg/dL (ref 70–99)
Potassium: 3.6 mEq/L (ref 3.5–5.1)
Sodium: 141 mEq/L (ref 135–145)

## 2018-12-11 LAB — LIPID PANEL
Cholesterol: 161 mg/dL (ref 0–200)
HDL: 50.1 mg/dL (ref >39.00)
LDL Cholesterol: 85 mg/dL (ref 0–99)
NonHDL: 110.58
Total CHOL/HDL Ratio: 3
Triglycerides: 129 mg/dL (ref 0.0–149.0)
VLDL: 25.8 mg/dL (ref 0.0–40.0)

## 2018-12-11 LAB — HEMOGLOBIN A1C: Hgb A1c MFr Bld: 6.2 % (ref 4.6–6.5)

## 2018-12-11 LAB — TSH: TSH: 4.72 u[IU]/mL — ABNORMAL HIGH (ref 0.35–4.50)

## 2018-12-12 ENCOUNTER — Encounter: Payer: Self-pay | Admitting: Internal Medicine

## 2018-12-13 ENCOUNTER — Ambulatory Visit (INDEPENDENT_AMBULATORY_CARE_PROVIDER_SITE_OTHER): Payer: Medicare Other | Admitting: Internal Medicine

## 2018-12-13 ENCOUNTER — Encounter: Payer: Self-pay | Admitting: Internal Medicine

## 2018-12-13 ENCOUNTER — Other Ambulatory Visit: Payer: Self-pay

## 2018-12-13 DIAGNOSIS — I10 Essential (primary) hypertension: Secondary | ICD-10-CM | POA: Diagnosis not present

## 2018-12-13 DIAGNOSIS — R945 Abnormal results of liver function studies: Secondary | ICD-10-CM

## 2018-12-13 DIAGNOSIS — N183 Chronic kidney disease, stage 3 unspecified: Secondary | ICD-10-CM

## 2018-12-13 DIAGNOSIS — R7989 Other specified abnormal findings of blood chemistry: Secondary | ICD-10-CM

## 2018-12-13 DIAGNOSIS — E7800 Pure hypercholesterolemia, unspecified: Secondary | ICD-10-CM | POA: Diagnosis not present

## 2018-12-13 DIAGNOSIS — F429 Obsessive-compulsive disorder, unspecified: Secondary | ICD-10-CM

## 2018-12-13 DIAGNOSIS — R739 Hyperglycemia, unspecified: Secondary | ICD-10-CM

## 2018-12-13 MED ORDER — SERTRALINE HCL 50 MG PO TABS: 75.0000 mg | ORAL_TABLET | Freq: Every day | ORAL | 3 refills | Status: DC

## 2018-12-13 MED ORDER — SIMVASTATIN 20 MG PO TABS: 20.0000 mg | ORAL_TABLET | Freq: Every day | ORAL | 1 refills | Status: DC

## 2018-12-13 MED ORDER — HYDROCHLOROTHIAZIDE 25 MG PO TABS: 25.0000 mg | ORAL_TABLET | Freq: Every day | ORAL | 1 refills | Status: DC

## 2018-12-13 MED ORDER — LOSARTAN POTASSIUM 100 MG PO TABS: 100.0000 mg | ORAL_TABLET | Freq: Every day | ORAL | 1 refills | Status: DC

## 2018-12-13 MED ORDER — BISOPROLOL FUMARATE 5 MG PO TABS: 5.0000 mg | ORAL_TABLET | Freq: Every day | ORAL | 2 refills | Status: DC

## 2018-12-13 NOTE — Progress Notes (Signed)
Patient ID: Lindsey Harmon, female   DOB: 18-Jul-1945, 73 y.o.   MRN: [id number]   Virtual Visit via telephone Note  This visit type was conducted due to national recommendations for restrictions regarding the COVID-19 pandemic (e.g. social distancing).  This format is felt to be most appropriate for this patient at this time.  All issues noted in this document were discussed and addressed.  No physical exam was performed (except for noted visual exam findings with Video Visits).   I connected with Alen Blew by a video enabled tel telephone and verified that I am speaking with the correct person using two identifiers. Location patient: home Location provider: work or home office Persons participating in the virtual visit: patient, provider  I discussed the limitations, risks, security and privacy concerns of performing an evaluation and management service by telephone and the availability of in person appointments. The patient expressed understanding and agreed to proceed.   Reason for visit: scheduled follow up  HPI: She reports she is doing well.  Feels good.  Stays active.  No chest pain.  No sob. No acid reflux.  No abdominal pain.  Bowels moving.  Discussed recent labs.  Discussed decreased GFR.  Stay hydrated.  Avoid antiinflammatories.  Blood pressure doing well.  States averages 130/70s.  Still - self cath.  Doing well.  Taking zoloft.  Helps.  Does not feel needs any further intervention.     ROS: See pertinent positives and negatives per HPI.  Past Medical History:  Diagnosis Date  . Abnormal liver function   . Cystocele, unspecified (CODE)   . Fibrocystic breast disease   . GERD (gastroesophageal reflux disease)   . Hypertension   . Intrinsic sphincter deficiency   . Pelvic relaxation   . Pure hypercholesterolemia   . Rectocele   . SUI (stress urinary incontinence, female)   . Urinary retention   . Urinary retention     Past Surgical History:  Procedure Laterality  Date  . ABDOMINAL HYSTERECTOMY    . APPENDECTOMY    . Bladder Tack    . burch urethropexy  10/24/2000   laparoscopic colpopexy/culdoplasty  . COLONOSCOPY WITH PROPOFOL N/A 08/10/2016   Procedure: COLONOSCOPY WITH PROPOFOL;  Surgeon: Lollie Sails, MD;  Location: [facility] ENDOSCOPY;  Service: Endoscopy;  Laterality: N/A;  . COLONOSCOPY WITH PROPOFOL N/A 10/20/2016   Procedure: COLONOSCOPY WITH PROPOFOL;  Surgeon: Robert Bellow, MD;  Location: [facility] ENDOSCOPY;  Service: Endoscopy;  Laterality: N/A;  . COLONOSCOPY WITH PROPOFOL N/A 06/01/2017   Procedure: COLONOSCOPY WITH PROPOFOL;  Surgeon: Robert Bellow, MD;  Location: [facility] ENDOSCOPY;  Service: Endoscopy;  Laterality: N/A;  . COLPORRHAPHY     forv repair cystocele anterior  . TRANSVAGINAL TAPE (TVT) REMOVAL      Family History  Problem Relation Age of Onset  . Pneumonia Mother        died of presumed aspiration pneumonia  . Hypertension Mother   . Cancer Father        prostate  . Heart disease Paternal Grandfather        myocardial infarction  . Alzheimer's disease Brother     SOCIAL HX: reviewed.    Current Outpatient Medications:  .  bisoprolol (ZEBETA) 5 MG tablet, Take 1 tablet (5 mg total) by mouth daily., Disp: 90 tablet, Rfl: 2 .  Calcium Carbonate-Vitamin D (CALTRATE 600+D PO), Take by mouth., Disp: , Rfl:  .  hydrochlorothiazide (HYDRODIURIL) 25 MG tablet, Take 1 tablet (25 mg total)  by mouth daily., Disp: 90 tablet, Rfl: 1 .  losartan (COZAAR) 100 MG tablet, Take 1 tablet (100 mg total) by mouth daily., Disp: 90 tablet, Rfl: 1 .  Omega-3 Fatty Acids (FISH OIL) 1200 MG CAPS, Take by mouth daily., Disp: , Rfl:  .  omeprazole (PRILOSEC) 20 MG capsule, TAKE 1 CAPSULE BY MOUTH TWICE A DAY, Disp: 180 capsule, Rfl: 1 .  sertraline (ZOLOFT) 50 MG tablet, Take 1.5 tablets (75 mg total) by mouth daily., Disp: 135 tablet, Rfl: 3 .  simvastatin (ZOCOR) 20 MG tablet, Take 1 tablet (20 mg total) by mouth daily., Disp: 90  tablet, Rfl: 1  EXAM:  VITALS per patient if applicable: 737/10  GENERAL: alert.  Sounds to be in no acute distress.  Answering questions appropriately.    PSYCH/NEURO: pleasant and cooperative, no obvious depression or anxiety, speech and thought processing grossly intact  ASSESSMENT AND PLAN:  Discussed the following assessment and plan:  Abnormal liver function Previously worked up by GI.  Felt to be due to fatty liver.  Liver panel just checked and wnl.  Follow.    CKD (chronic kidney disease) stage 3, GFR 30-59 ml/min (HCC) GFR 49 on recent check.  Instructed to avoid antiinflammatories.  Follow metabolic panel.    Essential hypertension, benign Blood pressure doing well.  Continue current medication regimen.  Follow pressures.  Follow metabolic panel.    Hypercholesterolemia On simvastatin.  Low cholesterol diet and exercise.  Follow lipid panel and liver function tests.    Hyperglycemia Low carb diet and exercise.  Follow met b and a1c.    Obsessive compulsive disorder On zoloft.  Discussed with her today.  She feels is helping.  Desires no further intervention.  Follow.    Elevated TSH Slightly elevated tsh.  Recheck tsh in 6 weeks.      I discussed the assessment and treatment plan with the patient. The patient was provided an opportunity to ask questions and all were answered. The patient agreed with the plan and demonstrated an understanding of the instructions.   The patient was advised to call back or seek an in-person evaluation if the symptoms worsen or if the condition fails to improve as anticipated.  I provided 18 minutes of non-face-to-face time during this encounter.   Einar Pheasant, MD

## 2018-12-17 ENCOUNTER — Encounter: Payer: Self-pay | Admitting: Internal Medicine

## 2018-12-17 DIAGNOSIS — R7989 Other specified abnormal findings of blood chemistry: Secondary | ICD-10-CM | POA: Insufficient documentation

## 2018-12-17 NOTE — Assessment & Plan Note (Signed)
On simvastatin.  Low cholesterol diet and exercise.  Follow lipid panel and liver function tests.   

## 2018-12-17 NOTE — Assessment & Plan Note (Signed)
GFR 49 on recent check.  Instructed to avoid antiinflammatories.  Follow metabolic panel.

## 2018-12-17 NOTE — Assessment & Plan Note (Signed)
On zoloft.  Discussed with her today.  She feels is helping.  Desires no further intervention.  Follow.

## 2018-12-17 NOTE — Assessment & Plan Note (Signed)
Low carb diet and exercise.  Follow met b and a1c.   

## 2018-12-17 NOTE — Assessment & Plan Note (Signed)
Previously worked up by GI.  Felt to be due to fatty liver.  Liver panel just checked and wnl.  Follow.

## 2018-12-17 NOTE — Assessment & Plan Note (Signed)
Blood pressure doing well.  Continue current medication regimen.  Follow pressures.  Follow metabolic panel.  

## 2018-12-17 NOTE — Assessment & Plan Note (Signed)
Slightly elevated tsh.  Recheck tsh in 6 weeks.

## 2018-12-19 ENCOUNTER — Encounter: Payer: Self-pay | Admitting: Internal Medicine

## 2018-12-19 ENCOUNTER — Telehealth: Payer: Self-pay | Admitting: Internal Medicine

## 2018-12-19 ENCOUNTER — Ambulatory Visit (INDEPENDENT_AMBULATORY_CARE_PROVIDER_SITE_OTHER): Payer: Medicare Other | Admitting: Internal Medicine

## 2018-12-19 ENCOUNTER — Other Ambulatory Visit (INDEPENDENT_AMBULATORY_CARE_PROVIDER_SITE_OTHER): Payer: Medicare Other

## 2018-12-19 ENCOUNTER — Other Ambulatory Visit: Payer: Self-pay

## 2018-12-19 DIAGNOSIS — N3000 Acute cystitis without hematuria: Secondary | ICD-10-CM | POA: Diagnosis not present

## 2018-12-19 DIAGNOSIS — R300 Dysuria: Secondary | ICD-10-CM | POA: Diagnosis not present

## 2018-12-19 LAB — URINALYSIS, MICROSCOPIC ONLY

## 2018-12-19 LAB — POCT URINALYSIS DIPSTICK
Blood, UA: NEGATIVE
Glucose, UA: NEGATIVE
Ketones, UA: NEGATIVE
Nitrite, UA: NEGATIVE
Protein, UA: POSITIVE — AB
Spec Grav, UA: 1.015 (ref 1.010–1.025)
Urobilinogen, UA: 0.2 E.U./dL
pH, UA: 7.5 (ref 5.0–8.0)

## 2018-12-19 MED ORDER — NITROFURANTOIN MONOHYD MACRO 100 MG PO CAPS: 100.0000 mg | ORAL_CAPSULE | Freq: Two times a day (BID) | ORAL | 0 refills | Status: DC

## 2018-12-19 NOTE — Progress Notes (Signed)
Patient ID: Lindsey Harmon, female   DOB: 02/28/1946, 73 y.o.   MRN: [id number]   Virtual Visit via telephone Note  This visit type was conducted due to national recommendations for restrictions regarding the COVID-19 pandemic (e.g. social distancing).  This format is felt to be most appropriate for this patient at this time.  All issues noted in this document were discussed and addressed.  No physical exam was performed (except for noted visual exam findings with Video Visits).   I connected with Alen Blew by telephone and verified that I am speaking with the correct person using two identifiers. Location patient: home Location provider: work  Persons participating in the telephone visit: patient, provider  I discussed the limitations, risks, security and privacy concerns of performing an evaluation and management service by telephone and the availability of in person appointments.  The patient expressed understanding and agreed to proceed.   Reason for visit: acute visit  HPI: Work in appt for possible UTI.  Started having some discomfort in her lower abdomen and some low back discomfort with increased urination.  She self cath. Is on low dose cephalexin daily.  States these symptoms feel similar to her previous UTIs.  Urine is darker.  No fever.  Eating.  Trying to stay hydrated.  No vomiting.     ROS: See pertinent positives and negatives per HPI.  Past Medical History:  Diagnosis Date  . Abnormal liver function   . Cystocele, unspecified (CODE)   . Fibrocystic breast disease   . GERD (gastroesophageal reflux disease)   . Hypertension   . Intrinsic sphincter deficiency   . Pelvic relaxation   . Pure hypercholesterolemia   . Rectocele   . SUI (stress urinary incontinence, female)   . Urinary retention   . Urinary retention     Past Surgical History:  Procedure Laterality Date  . ABDOMINAL HYSTERECTOMY    . APPENDECTOMY    . Bladder Tack    . burch urethropexy   10/24/2000   laparoscopic colpopexy/culdoplasty  . COLONOSCOPY WITH PROPOFOL N/A 08/10/2016   Procedure: COLONOSCOPY WITH PROPOFOL;  Surgeon: Lollie Sails, MD;  Location: [facility] ENDOSCOPY;  Service: Endoscopy;  Laterality: N/A;  . COLONOSCOPY WITH PROPOFOL N/A 10/20/2016   Procedure: COLONOSCOPY WITH PROPOFOL;  Surgeon: Robert Bellow, MD;  Location: [facility] ENDOSCOPY;  Service: Endoscopy;  Laterality: N/A;  . COLONOSCOPY WITH PROPOFOL N/A 06/01/2017   Procedure: COLONOSCOPY WITH PROPOFOL;  Surgeon: Robert Bellow, MD;  Location: [facility] ENDOSCOPY;  Service: Endoscopy;  Laterality: N/A;  . COLPORRHAPHY     forv repair cystocele anterior  . TRANSVAGINAL TAPE (TVT) REMOVAL      Family History  Problem Relation Age of Onset  . Pneumonia Mother        died of presumed aspiration pneumonia  . Hypertension Mother   . Cancer Father        prostate  . Heart disease Paternal Grandfather        myocardial infarction  . Alzheimer's disease Brother     SOCIAL HX: reviewed.    Current Outpatient Medications:  .  bisoprolol (ZEBETA) 5 MG tablet, Take 1 tablet (5 mg total) by mouth daily., Disp: 90 tablet, Rfl: 2 .  Calcium Carbonate-Vitamin D (CALTRATE 600+D PO), Take by mouth., Disp: , Rfl:  .  Clobetasol Prop Emollient Base 0.05 % emollient cream, Apply topically 2 (two) times daily., Disp: , Rfl:  .  hydrochlorothiazide (HYDRODIURIL) 25 MG tablet, Take 1 tablet (25 mg  total) by mouth daily., Disp: 90 tablet, Rfl: 1 .  losartan (COZAAR) 100 MG tablet, Take 1 tablet (100 mg total) by mouth daily., Disp: 90 tablet, Rfl: 1 .  Omega-3 Fatty Acids (FISH OIL) 1200 MG CAPS, Take by mouth daily., Disp: , Rfl:  .  omeprazole (PRILOSEC) 20 MG capsule, TAKE 1 CAPSULE BY MOUTH TWICE A DAY, Disp: 180 capsule, Rfl: 1 .  sertraline (ZOLOFT) 50 MG tablet, Take 1.5 tablets (75 mg total) by mouth daily., Disp: 135 tablet, Rfl: 3 .  simvastatin (ZOCOR) 20 MG tablet, Take 1 tablet (20 mg total) by mouth  daily., Disp: 90 tablet, Rfl: 1 .  nitrofurantoin, macrocrystal-monohydrate, (MACROBID) 100 MG capsule, Take 1 capsule (100 mg total) by mouth 2 (two) times daily., Disp: 10 capsule, Rfl: 0  EXAM:  GENERAL: alert.  Sounds to be in no acute distress.  Answering questions appropriately.    PSYCH/NEURO: pleasant and cooperative, no obvious depression or anxiety, speech and thought processing grossly intact  ASSESSMENT AND PLAN:  Discussed the following assessment and plan:  UTI (urinary tract infection) Urine dip with trace leukocytes with white blood cells noted on micro.  Given symptoms, will treat for UTI.  Send urine for culture.  Treat with macrobid.  Await culture results.  Stay hydrated.  Follow.  Notify if symptoms worsen or do not resolve.      I discussed the assessment and treatment plan with the patient. The patient was provided an opportunity to ask questions and all were answered. The patient agreed with the plan and demonstrated an understanding of the instructions.   The patient was advised to call back or seek an in-person evaluation if the symptoms worsen or if the condition fails to improve as anticipated.  I provided 15 minutes of non-face-to-face time during this encounter.   Einar Pheasant, MD

## 2018-12-19 NOTE — Telephone Encounter (Signed)
Labs in patient scheduled for virtual and lab.

## 2018-12-19 NOTE — Telephone Encounter (Signed)
You have 4:30 open

## 2018-12-19 NOTE — Telephone Encounter (Signed)
Ok to schedule at 4:30.  See if she can give urine sample prior to appt

## 2018-12-19 NOTE — Telephone Encounter (Signed)
The patient stated that she thinks she has a UTI. Dr. Nicki Reaper does not have any available appointments.I offered the patient an appointment with the NP , however the patient stated that the physician informed her that if she did not have any appointments she is to ask for Trisha or the head nurse. Therefore, I am forwarding the message to the Head nurse, due to [place] not being in the office.

## 2018-12-21 ENCOUNTER — Encounter: Payer: Self-pay | Admitting: General Surgery

## 2018-12-21 LAB — URINE CULTURE
MICRO NUMBER:: 734806
Result:: NO GROWTH
SPECIMEN QUALITY:: ADEQUATE

## 2018-12-24 ENCOUNTER — Encounter: Payer: Self-pay | Admitting: Internal Medicine

## 2018-12-24 DIAGNOSIS — N390 Urinary tract infection, site not specified: Secondary | ICD-10-CM | POA: Insufficient documentation

## 2018-12-24 NOTE — Assessment & Plan Note (Signed)
Urine dip with trace leukocytes with white blood cells noted on micro.  Given symptoms, will treat for UTI.  Send urine for culture.  Treat with macrobid.  Await culture results.  Stay hydrated.  Follow.  Notify if symptoms worsen or do not resolve.

## 2019-01-08 ENCOUNTER — Telehealth: Payer: Self-pay

## 2019-01-08 NOTE — Telephone Encounter (Signed)
Flu shot and lab appt moved to the 10th. Pt aware   Copied from [place] [id number]. Topic: General - Other >> Jan 08, 2019  2:57 PM Leward Quan A wrote: Reason for CRM: Patient called to say that she would like to get her flu shot when she come in for labs on 01/24/2019 asking for a call back please to schedule Ph# [phone]

## 2019-01-10 ENCOUNTER — Other Ambulatory Visit: Payer: Self-pay | Admitting: Internal Medicine

## 2019-01-24 ENCOUNTER — Other Ambulatory Visit: Payer: Medicare Other

## 2019-01-25 ENCOUNTER — Other Ambulatory Visit: Payer: Self-pay

## 2019-01-25 ENCOUNTER — Other Ambulatory Visit (INDEPENDENT_AMBULATORY_CARE_PROVIDER_SITE_OTHER): Payer: Medicare Other

## 2019-01-25 ENCOUNTER — Ambulatory Visit: Payer: Medicare Other

## 2019-01-25 DIAGNOSIS — Z23 Encounter for immunization: Secondary | ICD-10-CM

## 2019-01-25 DIAGNOSIS — R7989 Other specified abnormal findings of blood chemistry: Secondary | ICD-10-CM

## 2019-01-25 DIAGNOSIS — N183 Chronic kidney disease, stage 3 unspecified: Secondary | ICD-10-CM

## 2019-01-25 LAB — BASIC METABOLIC PANEL WITH GFR
BUN: 29 mg/dL — ABNORMAL HIGH (ref 6–23)
CO2: 28 meq/L (ref 19–32)
Calcium: 9.7 mg/dL (ref 8.4–10.5)
Chloride: 103 meq/L (ref 96–112)
Creatinine, Ser: 0.91 mg/dL (ref 0.40–1.20)
GFR: 60.16 mL/min
Glucose, Bld: 84 mg/dL (ref 70–99)
Potassium: 3.9 meq/L (ref 3.5–5.1)
Sodium: 140 meq/L (ref 135–145)

## 2019-01-25 LAB — TSH: TSH: 2.84 u[IU]/mL (ref 0.35–4.50)

## 2019-01-26 ENCOUNTER — Encounter: Payer: Self-pay | Admitting: Internal Medicine

## 2019-02-03 ENCOUNTER — Encounter: Payer: Self-pay | Admitting: Internal Medicine

## 2019-02-05 ENCOUNTER — Encounter: Payer: Self-pay | Admitting: Internal Medicine

## 2019-02-05 MED ORDER — CEPHALEXIN 250 MG PO CAPS: 250.0000 mg | ORAL_CAPSULE | Freq: Every day | ORAL | 2 refills | Status: DC

## 2019-02-05 NOTE — Telephone Encounter (Signed)
rx sent in for keflex 250mg  #30 with 2 refills.

## 2019-03-03 ENCOUNTER — Other Ambulatory Visit: Payer: Self-pay | Admitting: Internal Medicine

## 2019-05-04 ENCOUNTER — Other Ambulatory Visit: Payer: Self-pay | Admitting: Internal Medicine

## 2019-06-18 ENCOUNTER — Telehealth: Payer: Self-pay | Admitting: Internal Medicine

## 2019-06-18 NOTE — Telephone Encounter (Signed)
Pt called [facility] Pt called in and said she took the covid vaccine on 06/05/19 and she has been sick ever since she took the shot. She has chills, headache, body aches, no appetite, really week, and a deep cough. She is scheduled with Dr. Nicki Reaper on [place] 06/20/19 but wants to know if there is anything she can give her or call in before the appointment. She said the health department thinks she needs to be tested for Covid and told her to call her pcp. Please advise.

## 2019-06-18 NOTE — Telephone Encounter (Signed)
No SOB patient is going for testing appointment information given, patient scheduled for r12 tomorrow telephone visit.

## 2019-06-18 NOTE — Telephone Encounter (Signed)
I agree with need for testing.  Please give her information to go and be tested.  Please clarify no sob, etc.  If acute symptoms - urgent care.   I can see her tomorrow at 12:00.

## 2019-06-18 NOTE — Telephone Encounter (Signed)
Pt called in and said she took the covid vaccine on 06/05/19 and she has been sick ever since she took the shot. She has chills, headache, body aches, no appetite, really week, and a deep cough. She is scheduled with Dr. Nicki Reaper on [place] 06/20/19 but wants to know if there is anything she can give her or call in before the appointment. She said the health department thinks she needs to be tested for Covid and told her to call her pcp. Please advise.

## 2019-06-19 ENCOUNTER — Ambulatory Visit: Payer: Medicare PPO | Attending: Internal Medicine

## 2019-06-19 ENCOUNTER — Ambulatory Visit (INDEPENDENT_AMBULATORY_CARE_PROVIDER_SITE_OTHER): Payer: Medicare PPO | Admitting: Internal Medicine

## 2019-06-19 ENCOUNTER — Other Ambulatory Visit: Payer: Self-pay

## 2019-06-19 ENCOUNTER — Encounter: Payer: Self-pay | Admitting: Internal Medicine

## 2019-06-19 DIAGNOSIS — Z20822 Contact with and (suspected) exposure to covid-19: Secondary | ICD-10-CM

## 2019-06-19 DIAGNOSIS — R059 Cough, unspecified: Secondary | ICD-10-CM

## 2019-06-19 DIAGNOSIS — U071 COVID-19: Secondary | ICD-10-CM | POA: Insufficient documentation

## 2019-06-19 DIAGNOSIS — R05 Cough: Secondary | ICD-10-CM | POA: Diagnosis not present

## 2019-06-19 MED ORDER — ONDANSETRON 4 MG PO TBDP: 4.0000 mg | ORAL_TABLET | Freq: Two times a day (BID) | ORAL | 0 refills | Status: DC | PRN

## 2019-06-19 NOTE — Progress Notes (Signed)
Patient ID: Lindsey Harmon, female   DOB: 03-30-1946, 74 y.o.   MRN: [id number]   Virtual Visit via telephone Note  This visit type was conducted due to national recommendations for restrictions regarding the COVID-19 pandemic (e.g. social distancing).  This format is felt to be most appropriate for this patient at this time.  All issues noted in this document were discussed and addressed.  No physical exam was performed (except for noted visual exam findings with Video Visits).   I connected with Alen Blew by telephone and verified that I am speaking with the correct person using two identifiers. Location patient: home Location provider: work  Persons participating in the virtual visit: patient, provider  The limitations, risks, security and privacy concerns of performing an evaluation and management service by telephone and the availability of in person appointments have been discussed.  The patient expressed understanding and agreed to proceed.   Reason for visit: work in appt  HPI: She reports that she had her first covid vaccine 06/05/19.  Felt ok.  States two days after, started feeling bad.  She started having some soreness and aching.  This is better.  Subsequently developed a deep cough.  Previous headache.  No headache now.  No sinus congestion or significant nasal congestion.  Decreased appetite.  Stays cold.  No documented fever.  Feels tired and weak.  Liquid stool.  No chest tightness or sob.  Taking tylenol.  Has not been around anyone that she is aware of who has had covid.     ROS: See pertinent positives and negatives per HPI.  Past Medical History:  Diagnosis Date  . Abnormal liver function   . Cystocele, unspecified (CODE)   . Fibrocystic breast disease   . GERD (gastroesophageal reflux disease)   . Hypertension   . Intrinsic sphincter deficiency   . Pelvic relaxation   . Pure hypercholesterolemia   . Rectocele   . SUI (stress urinary incontinence, female)   .  Urinary retention   . Urinary retention     Past Surgical History:  Procedure Laterality Date  . ABDOMINAL HYSTERECTOMY    . APPENDECTOMY    . Bladder Tack    . burch urethropexy  10/24/2000   laparoscopic colpopexy/culdoplasty  . COLONOSCOPY WITH PROPOFOL N/A 08/10/2016   Procedure: COLONOSCOPY WITH PROPOFOL;  Surgeon: Lollie Sails, MD;  Location: [facility] ENDOSCOPY;  Service: Endoscopy;  Laterality: N/A;  . COLONOSCOPY WITH PROPOFOL N/A 10/20/2016   Procedure: COLONOSCOPY WITH PROPOFOL;  Surgeon: Robert Bellow, MD;  Location: [facility] ENDOSCOPY;  Service: Endoscopy;  Laterality: N/A;  . COLONOSCOPY WITH PROPOFOL N/A 06/01/2017   Procedure: COLONOSCOPY WITH PROPOFOL;  Surgeon: Robert Bellow, MD;  Location: [facility] ENDOSCOPY;  Service: Endoscopy;  Laterality: N/A;  . COLPORRHAPHY     forv repair cystocele anterior  . TRANSVAGINAL TAPE (TVT) REMOVAL      Family History  Problem Relation Age of Onset  . Pneumonia Mother        died of presumed aspiration pneumonia  . Hypertension Mother   . Cancer Father        prostate  . Heart disease Paternal Grandfather        myocardial infarction  . Alzheimer's disease Brother     SOCIAL HX: reviewed.    Current Outpatient Medications:  .  bisoprolol (ZEBETA) 5 MG tablet, Take 1 tablet (5 mg total) by mouth daily., Disp: 90 tablet, Rfl: 2 .  Calcium Carbonate-Vitamin D (CALTRATE 600+D PO), Take  by mouth., Disp: , Rfl:  .  Clobetasol Prop Emollient Base 0.05 % emollient cream, Apply topically 2 (two) times daily., Disp: , Rfl:  .  hydrochlorothiazide (HYDRODIURIL) 25 MG tablet, Take 1 tablet (25 mg total) by mouth daily., Disp: 90 tablet, Rfl: 1 .  losartan (COZAAR) 100 MG tablet, Take 1 tablet (100 mg total) by mouth daily., Disp: 90 tablet, Rfl: 1 .  nitrofurantoin, macrocrystal-monohydrate, (MACROBID) 100 MG capsule, Take 1 capsule (100 mg total) by mouth 2 (two) times daily., Disp: 10 capsule, Rfl: 0 .  Omega-3 Fatty Acids (FISH  OIL) 1200 MG CAPS, Take by mouth daily., Disp: , Rfl:  .  omeprazole (PRILOSEC) 20 MG capsule, TAKE 1 CAPSULE BY MOUTH TWICE A DAY, Disp: 180 capsule, Rfl: 1 .  sertraline (ZOLOFT) 50 MG tablet, Take 1.5 tablets (75 mg total) by mouth daily., Disp: 135 tablet, Rfl: 3 .  simvastatin (ZOCOR) 20 MG tablet, Take 1 tablet (20 mg total) by mouth daily., Disp: 90 tablet, Rfl: 1 .  ondansetron (ZOFRAN ODT) 4 MG disintegrating tablet, Take 1 tablet (4 mg total) by mouth 2 (two) times daily as needed for nausea or vomiting., Disp: 20 tablet, Rfl: 0  EXAM:  GENERAL: alert.  Sounds to be in no acute distress. Answering questions appropriately.    PSYCH/NEURO: pleasant and cooperative, no obvious depression or anxiety, speech and thought processing grossly intact  ASSESSMENT AND PLAN:  Discussed the following assessment and plan:  Cough Body aches and cough as outlined.  Discussed symptoms appear to be c/w covid.  Had first covid vaccine.  Symptoms started after vaccine.  Discussed covid and treating symptoms.  Aching is better.  Some decreased appetite with nausea.  rx sent in for zofran to use as needed.  Stay hydrated.  Tylenol as needed.  Rest. Fluids.  Try to keep hydrated.  Send for covid test.  Further recommendation regarding receiving second covid vaccine pending results of covid test.  Follow closely.  Call with update.  Discussed quarantine for her and her husband.     Meds ordered this encounter  Medications  . ondansetron (ZOFRAN ODT) 4 MG disintegrating tablet    Sig: Take 1 tablet (4 mg total) by mouth 2 (two) times daily as needed for nausea or vomiting.    Dispense:  20 tablet    Refill:  0     I discussed the assessment and treatment plan with the patient. The patient was provided an opportunity to ask questions and all were answered. The patient agreed with the plan and demonstrated an understanding of the instructions.   The patient was advised to call back or seek an in-person  evaluation if the symptoms worsen or if the condition fails to improve as anticipated.  I provided 21 minutes of non-face-to-face time during this encounter.   Einar Pheasant, MD

## 2019-06-20 ENCOUNTER — Ambulatory Visit: Payer: Medicare Other | Admitting: Internal Medicine

## 2019-06-20 ENCOUNTER — Telehealth: Payer: Self-pay | Admitting: Internal Medicine

## 2019-06-20 LAB — NOVEL CORONAVIRUS, NAA: SARS-CoV-2, NAA: DETECTED — AB

## 2019-06-20 NOTE — Telephone Encounter (Signed)
Pt called to inform that she's some what better, Pt has tested +covid. symp started on 06/07/2019. 1st shot for covid 06/05/2019 she would out of quantine today and can set up her 2nd shot. Please advise and Thank you!  Call pt @ [phone] and [phone].

## 2019-06-21 ENCOUNTER — Telehealth: Payer: Self-pay | Admitting: Pulmonary Disease

## 2019-06-21 NOTE — Telephone Encounter (Signed)
06/21/2019  Reach patient via telephone today.  A:  First covid shot on 06/05/19 Symptoms started on 06/07/19 Tested positive for covid on 06/19/19  Some symptoms are improving.  Overall patient feels that she is getting better.  Still has some fatigue. Per pt scheduled 06/30/19 for second shot   P:  Inform patient that CDC guidelines that I was last informed of for that she would need to wait 45 days from the Covid infection tibial to receive second Covid vaccine.  Informed patient to keep appointment slot for Covid vaccine.  But to make sure that they are aware prior to administering that second vaccine again she tested positive for Covid on 06/19/2019 and symptoms started on 06/07/2019.  Patient is outside of the 10-day symptom onset window to be a candidate for the monoclonal antibody infusion.  I have updated primary care Dr. Nicki Reaper and routed this message to her as FYI.  Wyn Quaker FNP

## 2019-06-21 NOTE — Telephone Encounter (Signed)
06/21/2019   Called patient back.  Discussed with patient that the vaccine clinic does not currently have any scheduled appointments for her.  Upon further discussion and review the patient reports that she received her vaccine from the health department.  She reports that it was a [place] nurse that she spoke with yesterday that said that it would be fine for her to receive her second vaccine.  I have explained to the patient that CDC guidelines are is that she would have to wait 45 days after the Covid positive test to receive her second vaccine.  I recommended the patient contact the health department to review her follow-up with them.  She also needs to notify them that she tested positive for Covid.  She reports that she will do so.  I told her that I will follow up with primary care and update them regarding the situation.  Patient is aware that likely she will have to reschedule her second Covid test to be 45 days after her positive Covid test on 06/19/2019.  Nothing further needed at this time.  Wyn Quaker, FNP

## 2019-06-21 NOTE — Telephone Encounter (Signed)
Patient wanted to let you know that she is scheduled to get her 2nd covid vaccine on 06/30/19. Feeling some better. Still tired and not much appetite but other wise ok.

## 2019-06-22 NOTE — Telephone Encounter (Signed)
Lindsey Harmon has been in contact with pt.  See his message. Please call pt and make sure she understands that she will need to postpone her second vaccine.  Will need to be 45 days from her positive test.  Let me know if any questions.

## 2019-06-22 NOTE — Telephone Encounter (Signed)
Called and talked with pt to make sure she understood. She is going to call and postpone her 2nd vaccine. She stated that she was feeling some better but still tired.

## 2019-06-24 ENCOUNTER — Encounter: Payer: Self-pay | Admitting: Internal Medicine

## 2019-06-24 DIAGNOSIS — R059 Cough, unspecified: Secondary | ICD-10-CM | POA: Insufficient documentation

## 2019-06-24 DIAGNOSIS — R05 Cough: Secondary | ICD-10-CM | POA: Insufficient documentation

## 2019-06-24 NOTE — Assessment & Plan Note (Addendum)
Body aches and cough as outlined.  Discussed symptoms appear to be c/w covid.  Had first covid vaccine.  Symptoms started after vaccine.  Discussed covid and treating symptoms.  Aching is better.  Some decreased appetite with nausea.  rx sent in for zofran to use as needed.  Stay hydrated.  Tylenol as needed.  Rest. Fluids.  Try to keep hydrated.  Send for covid test.  Further recommendation regarding receiving second covid vaccine pending results of covid test.  Follow closely.  Call with update.  Discussed quarantine for her and her husband.

## 2019-06-25 DIAGNOSIS — R32 Unspecified urinary incontinence: Secondary | ICD-10-CM | POA: Diagnosis not present

## 2019-06-25 DIAGNOSIS — R339 Retention of urine, unspecified: Secondary | ICD-10-CM | POA: Diagnosis not present

## 2019-06-25 DIAGNOSIS — N819 Female genital prolapse, unspecified: Secondary | ICD-10-CM | POA: Diagnosis not present

## 2019-07-23 ENCOUNTER — Encounter: Payer: Self-pay | Admitting: Internal Medicine

## 2019-07-25 ENCOUNTER — Other Ambulatory Visit: Payer: Self-pay | Admitting: Internal Medicine

## 2019-07-25 ENCOUNTER — Encounter: Payer: Self-pay | Admitting: Internal Medicine

## 2019-07-25 ENCOUNTER — Other Ambulatory Visit: Payer: Self-pay

## 2019-07-25 MED ORDER — CEPHALEXIN 250 MG PO CAPS: 250.0000 mg | ORAL_CAPSULE | Freq: Every day | ORAL | 2 refills | Status: DC

## 2019-07-31 ENCOUNTER — Other Ambulatory Visit: Payer: Self-pay | Admitting: Internal Medicine

## 2019-07-31 DIAGNOSIS — Z1231 Encounter for screening mammogram for malignant neoplasm of breast: Secondary | ICD-10-CM

## 2019-08-01 DIAGNOSIS — H35371 Puckering of macula, right eye: Secondary | ICD-10-CM | POA: Diagnosis not present

## 2019-08-01 DIAGNOSIS — H2513 Age-related nuclear cataract, bilateral: Secondary | ICD-10-CM | POA: Diagnosis not present

## 2019-08-05 ENCOUNTER — Other Ambulatory Visit: Payer: Self-pay | Admitting: Internal Medicine

## 2019-08-21 ENCOUNTER — Ambulatory Visit
Admission: RE | Admit: 2019-08-21 | Discharge: 2019-08-21 | Disposition: A | Discharge status: Home / Self Care | Payer: Medicare PPO | Source: Ambulatory Visit | Attending: Internal Medicine | Admitting: Internal Medicine

## 2019-08-21 DIAGNOSIS — Z1231 Encounter for screening mammogram for malignant neoplasm of breast: Secondary | ICD-10-CM | POA: Diagnosis not present

## 2019-08-25 ENCOUNTER — Other Ambulatory Visit: Payer: Self-pay | Admitting: Internal Medicine

## 2019-08-31 ENCOUNTER — Other Ambulatory Visit: Payer: Self-pay | Admitting: Internal Medicine

## 2019-09-18 DIAGNOSIS — N819 Female genital prolapse, unspecified: Secondary | ICD-10-CM | POA: Diagnosis not present

## 2019-09-18 DIAGNOSIS — R339 Retention of urine, unspecified: Secondary | ICD-10-CM | POA: Diagnosis not present

## 2019-09-18 DIAGNOSIS — R32 Unspecified urinary incontinence: Secondary | ICD-10-CM | POA: Diagnosis not present

## 2019-11-21 ENCOUNTER — Other Ambulatory Visit: Payer: Self-pay | Admitting: Internal Medicine

## 2019-11-28 ENCOUNTER — Ambulatory Visit: Payer: Medicare Other

## 2019-12-10 DIAGNOSIS — R339 Retention of urine, unspecified: Secondary | ICD-10-CM | POA: Diagnosis not present

## 2019-12-10 DIAGNOSIS — R32 Unspecified urinary incontinence: Secondary | ICD-10-CM | POA: Diagnosis not present

## 2019-12-10 DIAGNOSIS — N819 Female genital prolapse, unspecified: Secondary | ICD-10-CM | POA: Diagnosis not present

## 2020-01-16 ENCOUNTER — Ambulatory Visit (INDEPENDENT_AMBULATORY_CARE_PROVIDER_SITE_OTHER): Payer: Medicare PPO

## 2020-01-16 VITALS — Ht 63.0 in | Wt 185.0 lb

## 2020-01-16 DIAGNOSIS — Z0000 Encounter for general adult medical examination without abnormal findings: Secondary | ICD-10-CM | POA: Diagnosis not present

## 2020-01-16 NOTE — Patient Instructions (Addendum)
Lindsey Harmon , Thank you for taking time to come for your Medicare Wellness Visit. I appreciate your ongoing commitment to your health goals. Please review the following plan we discussed and let me know if I can assist you in the future.   These are the goals we discussed: Goals    . Increase physical activity     Standing/chair and stretch exercises 10 minutes daily.          This is a list of the screening recommended for you and due dates:  Health Maintenance  Topic Date Due  . DEXA scan (bone density measurement)  02/01/2020*  . Tetanus Vaccine  02/01/2020*  .  Hepatitis C: One time screening is recommended by Center for Disease Control  (CDC) for  adults born from [birth year] through 1967.   02/01/2020*  . Flu Shot  08/14/2020*  . COVID-19 Vaccine  Completed  . Pneumonia vaccines  Completed  *Topic was postponed. The date shown is not the original due date.    Health Maintenance Health Maintenance  Topic Date Due  . DEXA SCAN  02/01/2020 (Originally 05/21/2008)  . TETANUS/TDAP  02/01/2020 (Originally [birth year]21/1962)  . Hepatitis C Screening  02/01/2020 (Originally [birth year])  . INFLUENZA VACCINE  08/14/2020 (Originally 12/16/2019)  . COVID-19 Vaccine  Completed  . PNA vac Low Risk Adult  Completed   Keep all routine maintenance appointments.   Follow up 02/01/20 @ 10:30  Advanced directives: End of life planning; Advance aging; Advanced directives discussed.  Copy of current HCPOA/Living Will requested.    Conditions/risks identified: none new.  Follow up in one year for your annual wellness visit.   Preventive Care 76 Years and Older, Female Preventive care refers to lifestyle choices and visits with your health care provider that can promote health and wellness. What does preventive care include?  A yearly physical exam. This is also called an annual well check.  Dental exams once or twice a year.  Routine eye exams. Ask your health care provider how often you should have your  eyes checked.  Personal lifestyle choices, including:  Daily care of your teeth and gums.  Regular physical activity.  Eating a healthy diet.  Avoiding tobacco and drug use.  Limiting alcohol use.  Practicing safe sex.  Taking low-dose aspirin every day.  Taking vitamin and mineral supplements as recommended by your health care provider. What happens during an annual well check? The services and screenings done by your health care provider during your annual well check will depend on your age, overall health, lifestyle risk factors, and family history of disease. Counseling  Your health care provider may ask you questions about your:  Alcohol use.  Tobacco use.  Drug use.  Emotional well-being.  Home and relationship well-being.  Sexual activity.  Eating habits.  History of falls.  Memory and ability to understand (cognition).  Work and work Statistician.  Reproductive health. Screening  You may have the following tests or measurements:  Height, weight, and BMI.  Blood pressure.  Lipid and cholesterol levels. These may be checked every 5 years, or more frequently if you are over 2 years old.  Skin check.  Lung cancer screening. You may have this screening every year starting at age 21 if you have a 30-pack-year history of smoking and currently smoke or have quit within the past 15 years.  Fecal occult blood test (FOBT) of the stool. You may have this test every year starting at age 65.  Flexible sigmoidoscopy  or colonoscopy. You may have a sigmoidoscopy every 5 years or a colonoscopy every 10 years starting at age 76.  Hepatitis C blood test.  Hepatitis B blood test.  Sexually transmitted disease (STD) testing.  Diabetes screening. This is done by checking your blood sugar (glucose) after you have not eaten for a while (fasting). You may have this done every 1-3 years.  Bone density scan. This is done to screen for osteoporosis. You may have this  done starting at age 23.  Mammogram. This may be done every 1-2 years. Talk to your health care provider about how often you should have regular mammograms. Talk with your health care provider about your test results, treatment options, and if necessary, the need for more tests. Vaccines  Your health care provider may recommend certain vaccines, such as:  Influenza vaccine. This is recommended every year.  Tetanus, diphtheria, and acellular pertussis (Tdap, Td) vaccine. You may need a Td booster every 10 years.  Zoster vaccine. You may need this after age 76.  Pneumococcal 13-valent conjugate (PCV13) vaccine. One dose is recommended after age 76.  Pneumococcal polysaccharide (PPSV23) vaccine. One dose is recommended after age 76. Talk to your health care provider about which screenings and vaccines you need and how often you need them. This information is not intended to replace advice given to you by your health care provider. Make sure you discuss any questions you have with your health care provider. Document Released: 05/30/2015 Document Revised: 01/21/2016 Document Reviewed: 03/04/2015 Elsevier Interactive Patient Education  2017 [employer] Prevention in the Home Falls can cause injuries. They can happen to people of all ages. There are many things you can do to make your home safe and to help prevent falls. What can I do on the outside of my home?  Regularly fix the edges of walkways and driveways and fix any cracks.  Remove anything that might make you trip as you walk through a door, such as a raised step or threshold.  Trim any bushes or trees on the path to your home.  Use bright outdoor lighting.  Clear any walking paths of anything that might make someone trip, such as rocks or tools.  Regularly check to see if handrails are loose or broken. Make sure that both sides of any steps have handrails.  Any raised decks and porches should have guardrails on the  edges.  Have any leaves, snow, or ice cleared regularly.  Use sand or salt on walking paths during winter.  Clean up any spills in your garage right away. This includes oil or grease spills. What can I do in the bathroom?  Use night lights.  Install grab bars by the toilet and in the tub and shower. Do not use towel bars as grab bars.  Use non-skid mats or decals in the tub or shower.  If you need to sit down in the shower, use a plastic, non-slip stool.  Keep the floor dry. Clean up any water that spills on the floor as soon as it happens.  Remove soap buildup in the tub or shower regularly.  Attach bath mats securely with double-sided non-slip rug tape.  Do not have throw rugs and other things on the floor that can make you trip. What can I do in the bedroom?  Use night lights.  Make sure that you have a light by your bed that is easy to reach.  Do not use any sheets or blankets that are  too big for your bed. They should not hang down onto the floor.  Have a firm chair that has side arms. You can use this for support while you get dressed.  Do not have throw rugs and other things on the floor that can make you trip. What can I do in the kitchen?  Clean up any spills right away.  Avoid walking on wet floors.  Keep items that you use a lot in easy-to-reach places.  If you need to reach something above you, use a strong step stool that has a grab bar.  Keep electrical cords out of the way.  Do not use floor polish or wax that makes floors slippery. If you must use wax, use non-skid floor wax.  Do not have throw rugs and other things on the floor that can make you trip. What can I do with my stairs?  Do not leave any items on the stairs.  Make sure that there are handrails on both sides of the stairs and use them. Fix handrails that are broken or loose. Make sure that handrails are as long as the stairways.  Check any carpeting to make sure that it is firmly  attached to the stairs. Fix any carpet that is loose or worn.  Avoid having throw rugs at the top or bottom of the stairs. If you do have throw rugs, attach them to the floor with carpet tape.  Make sure that you have a light switch at the top of the stairs and the bottom of the stairs. If you do not have them, ask someone to add them for you. What else can I do to help prevent falls?  Wear shoes that:  Do not have high heels.  Have rubber bottoms.  Are comfortable and fit you well.  Are closed at the toe. Do not wear sandals.  If you use a stepladder:  Make sure that it is fully opened. Do not climb a closed stepladder.  Make sure that both sides of the stepladder are locked into place.  Ask someone to hold it for you, if possible.  Clearly mark and make sure that you can see:  Any grab bars or handrails.  First and last steps.  Where the edge of each step is.  Use tools that help you move around (mobility aids) if they are needed. These include:  Canes.  Walkers.  Scooters.  Crutches.  Turn on the lights when you go into a dark area. Replace any light bulbs as soon as they burn out.  Set up your furniture so you have a clear path. Avoid moving your furniture around.  If any of your floors are uneven, fix them.  If there are any pets around you, be aware of where they are.  Review your medicines with your doctor. Some medicines can make you feel dizzy. This can increase your chance of falling. Ask your doctor what other things that you can do to help prevent falls. This information is not intended to replace advice given to you by your health care provider. Make sure you discuss any questions you have with your health care provider. Document Released: 02/27/2009 Document Revised: 10/09/2015 Document Reviewed: 06/07/2014 Elsevier Interactive Patient Education  2017 [employer].

## 2020-01-16 NOTE — Progress Notes (Addendum)
Subjective:   Lindsey Harmon is a 74 y.o. female who presents for Medicare Annual (Subsequent) preventive examination.  Review of Systems    No ROS.  Medicare Wellness Virtual Visit.    Cardiac Risk Factors include: advanced age (>46men, >24 women);hypertension     Objective:    Today's Vitals   01/16/20 1303  Weight: 185 lb (83.9 kg)  Height: 5\' 3"  (1.6 m)   Body mass index is 32.77 kg/m.  Advanced Directives 01/16/2020 11/27/2018 09/22/2017 06/01/2017 10/20/2016 09/21/2016 08/10/2016  Does Patient Have a Medical Advance Directive? Yes Yes Yes Yes Yes Yes Yes  Type of Paramedic of [place];Living will [place];Living will [place];Living will [place];Living will Living will;Healthcare Power of Attorney Living will;Healthcare Power of [place];Living will  Does patient want to make changes to medical advance directive? No - Patient declined No - Patient declined No - Patient declined - - No - Patient declined -  Copy of [place] in Chart? No - copy requested No - copy requested No - copy requested No - copy requested No - copy requested No - copy requested No - copy requested    Current Medications (verified) Outpatient Encounter Medications as of 01/16/2020  Medication Sig  . bisoprolol (ZEBETA) 5 MG tablet TAKE 1 TABLET BY MOUTH EVERY DAY  . Calcium Carbonate-Vitamin D (CALTRATE 600+D PO) Take by mouth.  . cephALEXin (KEFLEX) 250 MG capsule TAKE 1 CAPSULE (250 MG TOTAL) BY MOUTH DAILY.  . Clobetasol Prop Emollient Base 0.05 % emollient cream Apply topically 2 (two) times daily.  . hydrochlorothiazide (HYDRODIURIL) 25 MG tablet TAKE 1 TABLET BY MOUTH EVERY DAY  . losartan (COZAAR) 100 MG tablet TAKE 1 TABLET BY MOUTH EVERY DAY  . nitrofurantoin, macrocrystal-monohydrate, (MACROBID) 100 MG capsule Take 1 capsule (100 mg total) by mouth 2 (two) times daily.    . Omega-3 Fatty Acids (FISH OIL) 1200 MG CAPS Take by mouth daily.  Marland Kitchen omeprazole (PRILOSEC) 20 MG capsule TAKE 1 CAPSULE BY MOUTH TWICE A DAY  . ondansetron (ZOFRAN ODT) 4 MG disintegrating tablet Take 1 tablet (4 mg total) by mouth 2 (two) times daily as needed for nausea or vomiting.  . sertraline (ZOLOFT) 50 MG tablet Take 1.5 tablets (75 mg total) by mouth daily.  . simvastatin (ZOCOR) 20 MG tablet TAKE 1 TABLET BY MOUTH EVERY DAY   No facility-administered encounter medications on file as of 01/16/2020.    Allergies (verified) Ace inhibitors   History: Past Medical History:  Diagnosis Date  . Abnormal liver function   . Cystocele, unspecified (CODE)   . Fibrocystic breast disease   . GERD (gastroesophageal reflux disease)   . Hypertension   . Intrinsic sphincter deficiency   . Pelvic relaxation   . Pure hypercholesterolemia   . Rectocele   . SUI (stress urinary incontinence, female)   . Urinary retention   . Urinary retention    Past Surgical History:  Procedure Laterality Date  . ABDOMINAL HYSTERECTOMY    . APPENDECTOMY    . Bladder Tack    . burch urethropexy  10/24/2000   laparoscopic colpopexy/culdoplasty  . COLONOSCOPY WITH PROPOFOL N/A 08/10/2016   Procedure: COLONOSCOPY WITH PROPOFOL;  Surgeon: Lollie Sails, MD;  Location: [facility] ENDOSCOPY;  Service: Endoscopy;  Laterality: N/A;  . COLONOSCOPY WITH PROPOFOL N/A 10/20/2016   Procedure: COLONOSCOPY WITH PROPOFOL;  Surgeon: Robert Bellow, MD;  Location:  [facility] ENDOSCOPY;  Service: Endoscopy;  Laterality: N/A;  . COLONOSCOPY WITH PROPOFOL N/A 06/01/2017   Procedure: COLONOSCOPY WITH PROPOFOL;  Surgeon: Robert Bellow, MD;  Location: [facility] ENDOSCOPY;  Service: Endoscopy;  Laterality: N/A;  . COLPORRHAPHY     forv repair cystocele anterior  . TRANSVAGINAL TAPE (TVT) REMOVAL     Family History  Problem Relation Age of Onset  . Pneumonia Mother        died of presumed aspiration pneumonia  . Hypertension  Mother   . Cancer Father        prostate  . Heart disease Paternal Grandfather        myocardial infarction  . Alzheimer's disease Brother    Social History   Socioeconomic History  . Marital status: Married    Spouse name: Not on file  . Number of children: Not on file  . Years of education: Not on file  . Highest education level: Not on file  Occupational History  . Not on file  Tobacco Use  . Smoking status: Never Smoker  . Smokeless tobacco: Never Used  Vaping Use  . Vaping Use: Never used  Substance and Sexual Activity  . Alcohol use: No    Alcohol/week: 0.0 standard drinks  . Drug use: No  . Sexual activity: Not on file  Other Topics Concern  . Not on file  Social History Narrative  . Not on file   Social Determinants of Health   Financial Resource Strain: Low Risk   . Difficulty of Paying Living Expenses: Not hard at all  Food Insecurity: No Food Insecurity  . Worried About Charity fundraiser in the Last Year: Never true  . Ran Out of Food in the Last Year: Never true  Transportation Needs: No Transportation Needs  . Lack of Transportation (Medical): No  . Lack of Transportation (Non-Medical): No  Physical Activity: Insufficiently Active  . Days of Exercise per Week: 7 days  . Minutes of Exercise per Session: 10 min  Stress: No Stress Concern Present  . Feeling of Stress : Not at all  Social Connections: Socially Integrated  . Frequency of Communication with Friends and Family: More than three times a week  . Frequency of Social Gatherings with Friends and Family: More than three times a week  . Attends Religious Services: More than 4 times per year  . Active Member of Clubs or Organizations: Yes  . Attends Archivist Meetings: Not on file  . Marital Status: Married    Tobacco Counseling Counseling given: Not Answered   Clinical Intake:  Pre-visit preparation completed: Yes        Diabetes: No  How often do you need to have  someone help you when you read instructions, pamphlets, or other written materials from your doctor or pharmacy?: 1 - Never  Interpreter Needed?: No      Activities of Daily Living In your present state of health, do you have any difficulty performing the following activities: 01/16/2020  Hearing? N  Vision? N  Difficulty concentrating or making decisions? N  Walking or climbing stairs? N  Dressing or bathing? N  Doing errands, shopping? N  Preparing Food and eating ? N  Using the Toilet? N  In the past six months, have you accidently leaked urine? N  Do you have problems with loss of bowel control? N  Managing your Medications? N  Managing your Finances? N  Housekeeping or managing your Housekeeping? N  Some recent  data might be hidden    Patient Care Team: Einar Pheasant, MD as PCP - General (Internal Medicine)  Indicate any recent Medical Services you may have received from other than [facility] providers in the past year (date may be approximate).     Assessment:   This is a routine wellness examination for Abbigayle.  I connected with Baljit today by telephone and verified that I am speaking with the correct person using two identifiers. Location patient: home Location provider: work Persons participating in the virtual visit: patient, Marine scientist.    I discussed the limitations, risks, security and privacy concerns of performing an evaluation and management service by telephone and the availability of in person appointments. The patient expressed understanding and verbally consented to this telephonic visit.    Interactive audio and video telecommunications were attempted between this provider and patient, however failed, due to patient having technical difficulties OR patient did not have access to video capability.  We continued and completed visit with audio only.  Some vital signs may be absent or patient reported.   Hearing/Vision screen  Hearing Screening   125Hz  250Hz   500Hz  1000Hz  2000Hz  3000Hz  4000Hz  6000Hz  8000Hz   Right ear:           Left ear:           Comments: Patient is able to hear conversational tones without difficulty.  No issues reported.  Vision Screening Comments: Visual acuity not assessed, virtual visit.  They have seen their ophthalmologist in the last 12 months.     Dietary issues and exercise activities discussed: Current Exercise Habits: Home exercise routine, Time (Minutes): 10, Frequency (Times/Week): 7, Weekly Exercise (Minutes/Week): 70, Intensity: Mild  Goals    . Increase physical activity     Standing/chair and stretch exercises 10 minutes daily.         Depression Screen PHQ 2/9 Scores 01/16/2020 11/27/2018 09/22/2017 05/27/2017 09/21/2016 05/23/2015 02/26/2014  PHQ - 2 Score 0 0 0 0 0 0 0  PHQ- 9 Score - - - - 0 - -    Fall Risk Fall Risk  01/16/2020 06/19/2019 11/27/2018 09/22/2017 05/27/2017  Falls in the past year? 0 0 0 No No  Number falls in past yr: 0 - - - -  Injury with Fall? - - - - -  Follow up Falls evaluation completed Falls evaluation completed - - -   Handrails in use when climbing stairs?Yes  Home free of loose throw rugs in walkways, pet beds, electrical cords, etc? Yes  Adequate lighting in your home to reduce risk of falls? Yes   ASSISTIVE DEVICES UTILIZED TO PREVENT FALLS: Life alert? No  Use of a cane, walker or w/c? No   TIMED UP AND GO: Was the test performed? No . Virtual visit.   Cognitive Function: Patient is alert and oriented x3.  Denies difficulty focusing, making decisions, memory loss.  Enjoys reading and completing brain challenging word search puzzles.   MMSE - Mini Mental State Exam 09/22/2017 09/21/2016  Orientation to time 5 5  Orientation to Place 5 5  Registration 3 3  Attention/ Calculation 5 5  Recall 3 3  Language- name 2 objects 2 2  Language- repeat 1 1  Language- follow 3 step command 3 3  Language- read & follow direction 1 1  Write a sentence 1 1  Copy design 1 1  Total  score 30 30     6CIT Screen 11/27/2018  What Year? 0 points  What month?  0 points  What time? 0 points  Count back from 20 0 points  Months in reverse 0 points  Repeat phrase 0 points  Total Score 0    Immunizations Immunization History  Administered Date(s) Administered  . Fluad Quad(high Dose 65+) 01/25/2019  . Influenza, High Dose Seasonal PF 02/17/2016, 02/22/2017, 02/28/2018  . PFIZER SARS-COV-2 Vaccination 06/05/2019, 06/30/2019  . Pneumococcal Conjugate-13 11/24/2016  . Pneumococcal Polysaccharide-23 06/01/2018    TDAP status: Due, Education has been provided regarding the importance of this vaccine. Advised may receive this vaccine at local pharmacy or Health Dept. Aware to provide a copy of the vaccination record if obtained from local pharmacy or Health Dept. Verbalized acceptance and understanding. Deferred.   Health Maintenance Health Maintenance  Topic Date Due  . DEXA SCAN  02/01/2020 (Originally 05/21/2008)  . TETANUS/TDAP  02/01/2020 (Originally 05/21/1962)  . Hepatitis C Screening  02/01/2020 (Originally 02/05/1946)  . INFLUENZA VACCINE  08/14/2020 (Originally 12/16/2019)  . COVID-19 Vaccine  Completed  . PNA vac Low Risk Adult  Completed   Dexa Scan- deferred per patient preference.  Influenza vaccine- deferred per patient preference.  Hep C Screening- deferred per patient preference.  Dental Screening: Recommended annual dental exams for proper oral hygiene.  Community Resource Referral / Chronic Care Management: CRR required this visit?  No   CCM required this visit?  No      Plan:   Keep all routine maintenance appointments.   Follow up 02/01/20 @ 10:30  I have personally reviewed and noted the following in the patient's chart:   . Medical and social history . Use of alcohol, tobacco or illicit drugs  . Current medications and supplements . Functional ability and status . Nutritional status . Physical activity . Advanced directives . List of  other physicians . Hospitalizations, surgeries, and ER visits in previous 12 months . Vitals . Screenings to include cognitive, depression, and falls . Referrals and appointments  In addition, I have reviewed and discussed with patient certain preventive protocols, quality metrics, and best practice recommendations. A written personalized care plan for preventive services as well as general preventive health recommendations were provided to patient via mychart.     Varney Biles, LPN   12/17/9935     Reviewed above information.  Agree with assessment and plan.   Dr Nicki Reaper

## 2020-01-26 ENCOUNTER — Other Ambulatory Visit: Payer: Self-pay | Admitting: Internal Medicine

## 2020-01-31 ENCOUNTER — Ambulatory Visit: Payer: Medicare PPO | Admitting: Internal Medicine

## 2020-02-01 ENCOUNTER — Encounter: Payer: Self-pay | Admitting: Internal Medicine

## 2020-02-01 ENCOUNTER — Other Ambulatory Visit: Payer: Self-pay

## 2020-02-01 ENCOUNTER — Ambulatory Visit (INDEPENDENT_AMBULATORY_CARE_PROVIDER_SITE_OTHER): Payer: Medicare PPO | Admitting: Internal Medicine

## 2020-02-01 VITALS — BP 134/80 | HR 63 | Temp 98.2°F | Ht 62.99 in | Wt 183.4 lb

## 2020-02-01 DIAGNOSIS — E7800 Pure hypercholesterolemia, unspecified: Secondary | ICD-10-CM | POA: Diagnosis not present

## 2020-02-01 DIAGNOSIS — R739 Hyperglycemia, unspecified: Secondary | ICD-10-CM | POA: Diagnosis not present

## 2020-02-01 DIAGNOSIS — N183 Chronic kidney disease, stage 3 unspecified: Secondary | ICD-10-CM | POA: Diagnosis not present

## 2020-02-01 DIAGNOSIS — Z23 Encounter for immunization: Secondary | ICD-10-CM | POA: Diagnosis not present

## 2020-02-01 DIAGNOSIS — F429 Obsessive-compulsive disorder, unspecified: Secondary | ICD-10-CM | POA: Diagnosis not present

## 2020-02-01 DIAGNOSIS — R945 Abnormal results of liver function studies: Secondary | ICD-10-CM | POA: Diagnosis not present

## 2020-02-01 DIAGNOSIS — R0989 Other specified symptoms and signs involving the circulatory and respiratory systems: Secondary | ICD-10-CM

## 2020-02-01 DIAGNOSIS — I10 Essential (primary) hypertension: Secondary | ICD-10-CM

## 2020-02-01 DIAGNOSIS — R6889 Other general symptoms and signs: Secondary | ICD-10-CM

## 2020-02-01 LAB — HEPATIC FUNCTION PANEL
ALT: 46 U/L — ABNORMAL HIGH (ref 0–35)
AST: 39 U/L — ABNORMAL HIGH (ref 0–37)
Albumin: 4.7 g/dL (ref 3.5–5.2)
Alkaline Phosphatase: 60 U/L (ref 39–117)
Bilirubin, Direct: 0.2 mg/dL (ref 0.0–0.3)
Total Bilirubin: 1.1 mg/dL (ref 0.2–1.2)
Total Protein: 6.9 g/dL (ref 6.0–8.3)

## 2020-02-01 LAB — BASIC METABOLIC PANEL
BUN: 26 mg/dL — ABNORMAL HIGH (ref 6–23)
CO2: 28 mEq/L (ref 19–32)
Calcium: 9.9 mg/dL (ref 8.4–10.5)
Chloride: 103 mEq/L (ref 96–112)
Creatinine, Ser: 0.98 mg/dL (ref 0.40–1.20)
GFR: 55.08 mL/min — ABNORMAL LOW (ref >60.00)
Glucose, Bld: 100 mg/dL — ABNORMAL HIGH (ref 70–99)
Potassium: 3.4 mEq/L — ABNORMAL LOW (ref 3.5–5.1)
Sodium: 140 mEq/L (ref 135–145)

## 2020-02-01 LAB — CBC WITH DIFFERENTIAL/PLATELET
Basophils Absolute: 0.1 10*3/uL (ref 0.0–0.1)
Basophils Relative: 1.2 % (ref 0.0–3.0)
Eosinophils Absolute: 0.1 10*3/uL (ref 0.0–0.7)
Eosinophils Relative: 2.1 % (ref 0.0–5.0)
HCT: 39.7 % (ref 36.0–46.0)
Hemoglobin: 13.4 g/dL (ref 12.0–15.0)
Lymphocytes Relative: 24.6 % (ref 12.0–46.0)
Lymphs Abs: 1.4 10*3/uL (ref 0.7–4.0)
MCHC: 33.7 g/dL (ref 30.0–36.0)
MCV: 87.3 fl (ref 78.0–100.0)
Monocytes Absolute: 0.4 10*3/uL (ref 0.1–1.0)
Monocytes Relative: 6.8 % (ref 3.0–12.0)
Neutro Abs: 3.8 10*3/uL (ref 1.4–7.7)
Neutrophils Relative %: 65.3 % (ref 43.0–77.0)
Platelets: 199 10*3/uL (ref 150.0–400.0)
RBC: 4.54 Mil/uL (ref 3.87–5.11)
RDW: 14.7 % (ref 11.5–15.5)
WBC: 5.8 10*3/uL (ref 4.0–10.5)

## 2020-02-01 LAB — LIPID PANEL
Cholesterol: 145 mg/dL (ref 0–200)
HDL: 38.8 mg/dL — ABNORMAL LOW (ref >39.00)
LDL Cholesterol: 77 mg/dL (ref 0–99)
NonHDL: 106.45
Total CHOL/HDL Ratio: 4
Triglycerides: 147 mg/dL (ref 0.0–149.0)
VLDL: 29.4 mg/dL (ref 0.0–40.0)

## 2020-02-01 LAB — HEMOGLOBIN A1C: Hgb A1c MFr Bld: 6.3 % (ref 4.6–6.5)

## 2020-02-01 LAB — TSH: TSH: 1.86 u[IU]/mL (ref 0.35–4.50)

## 2020-02-01 NOTE — Progress Notes (Signed)
Patient ID: Lindsey Harmon, female   DOB: May 29, 1945, 74 y.o.   MRN: [id number]   Subjective:    Patient ID: Lindsey Harmon, female    DOB: 1945/06/14, 74 y.o.   MRN: [id number]  HPI This visit occurred during the SARS-CoV-2 public health emergency.  Safety protocols were in place, including screening questions prior to the visit, additional usage of staff PPE, and extensive cleaning of exam room while observing appropriate contact time as indicated for disinfecting solutions.  Patient here for a scheduled follow up.  She reports she is doing relatively well.  Diagnosed with covid 06/2019.  Doing better.  No chest pain or sob.  Reports clearing her throat, but no deep chest congestion or increased cough.  No nausea or vomiting.  Bowels moving.  States blood pressure ok.  Last night 130/70.  Handling stress.  On zoloft.  Does feel things are stable.    Past Medical History:  Diagnosis Date  . Abnormal liver function   . Cystocele, unspecified (CODE)   . Fibrocystic breast disease   . GERD (gastroesophageal reflux disease)   . Hypertension   . Intrinsic sphincter deficiency   . Pelvic relaxation   . Pure hypercholesterolemia   . Rectocele   . SUI (stress urinary incontinence, female)   . Urinary retention   . Urinary retention    Past Surgical History:  Procedure Laterality Date  . ABDOMINAL HYSTERECTOMY    . APPENDECTOMY    . Bladder Tack    . burch urethropexy  10/24/2000   laparoscopic colpopexy/culdoplasty  . COLONOSCOPY WITH PROPOFOL N/A 08/10/2016   Procedure: COLONOSCOPY WITH PROPOFOL;  Surgeon: Lollie Sails, MD;  Location: [facility] ENDOSCOPY;  Service: Endoscopy;  Laterality: N/A;  . COLONOSCOPY WITH PROPOFOL N/A 10/20/2016   Procedure: COLONOSCOPY WITH PROPOFOL;  Surgeon: Robert Bellow, MD;  Location: [facility] ENDOSCOPY;  Service: Endoscopy;  Laterality: N/A;  . COLONOSCOPY WITH PROPOFOL N/A 06/01/2017   Procedure: COLONOSCOPY WITH PROPOFOL;  Surgeon: Robert Bellow, MD;   Location: [facility] ENDOSCOPY;  Service: Endoscopy;  Laterality: N/A;  . COLPORRHAPHY     forv repair cystocele anterior  . TRANSVAGINAL TAPE (TVT) REMOVAL     Family History  Problem Relation Age of Onset  . Pneumonia Mother        died of presumed aspiration pneumonia  . Hypertension Mother   . Cancer Father        prostate  . Heart disease Paternal Grandfather        myocardial infarction  . Alzheimer's disease Brother    Social History   Socioeconomic History  . Marital status: Married    Spouse name: Not on file  . Number of children: Not on file  . Years of education: Not on file  . Highest education level: Not on file  Occupational History  . Not on file  Tobacco Use  . Smoking status: Never Smoker  . Smokeless tobacco: Never Used  Vaping Use  . Vaping Use: Never used  Substance and Sexual Activity  . Alcohol use: No    Alcohol/week: 0.0 standard drinks  . Drug use: No  . Sexual activity: Not on file  Other Topics Concern  . Not on file  Social History Narrative  . Not on file   Social Determinants of Health   Financial Resource Strain: Low Risk   . Difficulty of Paying Living Expenses: Not hard at all  Food Insecurity: No Food Insecurity  . Worried About Estate manager/land agent  of Food in the Last Year: Never true  . Ran Out of Food in the Last Year: Never true  Transportation Needs: No Transportation Needs  . Lack of Transportation (Medical): No  . Lack of Transportation (Non-Medical): No  Physical Activity: Insufficiently Active  . Days of Exercise per Week: 7 days  . Minutes of Exercise per Session: 10 min  Stress: No Stress Concern Present  . Feeling of Stress : Not at all  Social Connections: Socially Integrated  . Frequency of Communication with Friends and Family: More than three times a week  . Frequency of Social Gatherings with Friends and Family: More than three times a week  . Attends Religious Services: More than 4 times per year  . Active Member of  Clubs or Organizations: Yes  . Attends Archivist Meetings: Not on file  . Marital Status: Married    Outpatient Encounter Medications as of 02/01/2020  Medication Sig  . bisoprolol (ZEBETA) 5 MG tablet TAKE 1 TABLET BY MOUTH EVERY DAY  . Calcium Carbonate-Vitamin D (CALTRATE 600+D PO) Take by mouth.  . cephALEXin (KEFLEX) 250 MG capsule TAKE 1 CAPSULE (250 MG TOTAL) BY MOUTH DAILY.  . Clobetasol Prop Emollient Base 0.05 % emollient cream Apply topically 2 (two) times daily.  . hydrochlorothiazide (HYDRODIURIL) 25 MG tablet TAKE 1 TABLET BY MOUTH EVERY DAY  . losartan (COZAAR) 100 MG tablet TAKE 1 TABLET BY MOUTH EVERY DAY  . Omega-3 Fatty Acids (FISH OIL) 1200 MG CAPS Take by mouth daily.  Marland Kitchen omeprazole (PRILOSEC) 20 MG capsule TAKE 1 CAPSULE BY MOUTH TWICE A DAY  . sertraline (ZOLOFT) 50 MG tablet TAKE 1 AND 1/2 TABLETS BY MOUTH DAILY  . simvastatin (ZOCOR) 20 MG tablet TAKE 1 TABLET BY MOUTH EVERY DAY  . [DISCONTINUED] nitrofurantoin, macrocrystal-monohydrate, (MACROBID) 100 MG capsule Take 1 capsule (100 mg total) by mouth 2 (two) times daily.  . [DISCONTINUED] ondansetron (ZOFRAN ODT) 4 MG disintegrating tablet Take 1 tablet (4 mg total) by mouth 2 (two) times daily as needed for nausea or vomiting.   No facility-administered encounter medications on file as of 02/01/2020.    Review of Systems  Constitutional: Negative for appetite change and unexpected weight change.  HENT: Negative for congestion and sinus pressure.        Reports clearing throat as outlined.    Respiratory: Negative for cough, chest tightness and shortness of breath.   Cardiovascular: Negative for chest pain, palpitations and leg swelling.  Gastrointestinal: Negative for abdominal pain, diarrhea, nausea and vomiting.  Genitourinary: Negative for difficulty urinating and dysuria.  Musculoskeletal: Negative for joint swelling and myalgias.  Skin: Negative for color change and rash.  Neurological:  Negative for dizziness, light-headedness and headaches.  Psychiatric/Behavioral: Negative for agitation and dysphoric mood.       Objective:    Physical Exam Vitals reviewed.  Constitutional:      General: She is not in acute distress.    Appearance: Normal appearance.  HENT:     Head: Normocephalic and atraumatic.     Right Ear: External ear normal.     Left Ear: External ear normal.  Eyes:     General: No scleral icterus.       Right eye: No discharge.        Left eye: No discharge.     Conjunctiva/sclera: Conjunctivae normal.  Neck:     Thyroid: No thyromegaly.  Cardiovascular:     Rate and Rhythm: Normal rate and regular rhythm.  Pulmonary:     Effort: No respiratory distress.     Breath sounds: Normal breath sounds. No wheezing.  Abdominal:     General: Bowel sounds are normal.     Palpations: Abdomen is soft.     Tenderness: There is no abdominal tenderness.  Musculoskeletal:        General: No swelling or tenderness.     Cervical back: Neck supple. No tenderness.  Lymphadenopathy:     Cervical: No cervical adenopathy.  Skin:    Findings: No erythema or rash.  Neurological:     Mental Status: She is alert.  Psychiatric:        Mood and Affect: Mood normal.        Behavior: Behavior normal.     BP 134/80   Pulse 63   Temp 98.2 F (36.8 C)   Ht 5' 2.99" (1.6 m)   Wt 183 lb 6.4 oz (83.2 kg)   SpO2 93%   BMI 32.50 kg/m  Wt Readings from Last 3 Encounters:  02/01/20 183 lb 6.4 oz (83.2 kg)  01/16/20 185 lb (83.9 kg)  06/19/19 175 lb (79.4 kg)     Lab Results  Component Value Date   WBC 5.8 02/01/2020   HGB 13.4 02/01/2020   HCT 39.7 02/01/2020   PLT 199.0 02/01/2020   GLUCOSE 100 (H) 02/01/2020   CHOL 145 02/01/2020   TRIG 147.0 02/01/2020   HDL 38.80 (L) 02/01/2020   LDLDIRECT 83.0 05/30/2018   LDLCALC 77 02/01/2020   ALT 46 (H) 02/01/2020   AST 39 (H) 02/01/2020   NA 140 02/01/2020   K 3.4 (L) 02/01/2020   CL 103 02/01/2020    CREATININE 0.98 02/01/2020   BUN 26 (H) 02/01/2020   CO2 28 02/01/2020   TSH 1.86 02/01/2020   HGBA1C 6.3 02/01/2020    MM 3D SCREEN BREAST BILATERAL  Result Date: 08/21/2019 CLINICAL DATA:  Screening. EXAM: DIGITAL SCREENING BILATERAL MAMMOGRAM WITH TOMO AND CAD COMPARISON:  Previous exam(s). ACR Breast Density Category b: There are scattered areas of fibroglandular density. FINDINGS: There are no findings suspicious for malignancy. Images were processed with CAD. IMPRESSION: No mammographic evidence of malignancy. A result letter of this screening mammogram will be mailed directly to the patient. RECOMMENDATION: Screening mammogram in one year. (Code:SM-B-01Y) BI-RADS CATEGORY  1: Negative. Electronically Signed   By: Margarette Canada M.D.   On: 08/21/2019 13:59       Assessment & Plan:   Problem List Items Addressed This Visit    Throat clearing    Continue prilosec.  Consider nasacort.  Follow.  Notify me if persistent.        Obsessive compulsive disorder    Stable.  On zoloft.  Follow.       Hyperglycemia - Primary    Low carb diet and exercise.  Follow met b and a1c.        Relevant Orders   Hemoglobin A1c (Completed)   Hypercholesterolemia    On simvastatin.  Low cholesterol diet and exercise.  Follow lipid panel and liver function tests.        Relevant Orders   Hepatic function panel (Completed)   Lipid panel (Completed)   Essential hypertension, benign    Blood pressure on recheck improved. Outside checks ok.  Continue zebeta.  Follow pressures.  Follow metabolic panel.       Relevant Orders   CBC with Differential/Platelet (Completed)   TSH (Completed)   Basic metabolic panel (Completed)  CKD (chronic kidney disease) stage 3, GFR 30-59 ml/min (HCC)    Avoid antiinflammatories.  Follow kidney function/GFR.       Abnormal liver function    Previously worked up by GI.  Felt to be due to fatty liver.  Recheck liver function tests.         Other Visit Diagnoses     Need for immunization against influenza       Relevant Orders   Flu Vaccine QUAD High Dose(Fluad) (Completed)       Einar Pheasant, MD

## 2020-02-05 ENCOUNTER — Other Ambulatory Visit: Payer: Self-pay | Admitting: Internal Medicine

## 2020-02-05 DIAGNOSIS — E876 Hypokalemia: Secondary | ICD-10-CM

## 2020-02-05 DIAGNOSIS — R945 Abnormal results of liver function studies: Secondary | ICD-10-CM

## 2020-02-05 NOTE — Progress Notes (Signed)
Order placed for f/u labs.  

## 2020-02-10 ENCOUNTER — Encounter: Payer: Self-pay | Admitting: Internal Medicine

## 2020-02-10 DIAGNOSIS — R0989 Other specified symptoms and signs involving the circulatory and respiratory systems: Secondary | ICD-10-CM | POA: Insufficient documentation

## 2020-02-10 NOTE — Assessment & Plan Note (Signed)
Avoid antiinflammatories.  Follow kidney function/GFR.

## 2020-02-10 NOTE — Assessment & Plan Note (Signed)
Stable.  On zoloft.  Follow.

## 2020-02-10 NOTE — Assessment & Plan Note (Signed)
On simvastatin.  Low cholesterol diet and exercise.  Follow lipid panel and liver function tests.   

## 2020-02-10 NOTE — Assessment & Plan Note (Signed)
Blood pressure on recheck improved. Outside checks ok.  Continue zebeta.  Follow pressures.  Follow metabolic panel.

## 2020-02-10 NOTE — Assessment & Plan Note (Signed)
Previously worked up by GI.  Felt to be due to fatty liver.  Recheck liver function tests.

## 2020-02-10 NOTE — Assessment & Plan Note (Signed)
Low carb diet and exercise.  Follow met b and a1c.   

## 2020-02-10 NOTE — Assessment & Plan Note (Signed)
Continue prilosec.  Consider nasacort.  Follow.  Notify me if persistent.

## 2020-02-12 ENCOUNTER — Other Ambulatory Visit: Payer: Self-pay | Admitting: Internal Medicine

## 2020-02-15 DIAGNOSIS — L821 Other seborrheic keratosis: Secondary | ICD-10-CM | POA: Diagnosis not present

## 2020-02-15 DIAGNOSIS — L570 Actinic keratosis: Secondary | ICD-10-CM | POA: Diagnosis not present

## 2020-02-15 DIAGNOSIS — D2261 Melanocytic nevi of right upper limb, including shoulder: Secondary | ICD-10-CM | POA: Diagnosis not present

## 2020-02-15 DIAGNOSIS — Z85828 Personal history of other malignant neoplasm of skin: Secondary | ICD-10-CM | POA: Diagnosis not present

## 2020-02-15 DIAGNOSIS — D225 Melanocytic nevi of trunk: Secondary | ICD-10-CM | POA: Diagnosis not present

## 2020-02-15 DIAGNOSIS — X32XXXA Exposure to sunlight, initial encounter: Secondary | ICD-10-CM | POA: Diagnosis not present

## 2020-02-15 DIAGNOSIS — D2262 Melanocytic nevi of left upper limb, including shoulder: Secondary | ICD-10-CM | POA: Diagnosis not present

## 2020-02-16 ENCOUNTER — Other Ambulatory Visit: Payer: Self-pay | Admitting: Internal Medicine

## 2020-03-03 DIAGNOSIS — R339 Retention of urine, unspecified: Secondary | ICD-10-CM | POA: Diagnosis not present

## 2020-03-03 DIAGNOSIS — N819 Female genital prolapse, unspecified: Secondary | ICD-10-CM | POA: Diagnosis not present

## 2020-03-03 DIAGNOSIS — R32 Unspecified urinary incontinence: Secondary | ICD-10-CM | POA: Diagnosis not present

## 2020-03-05 ENCOUNTER — Other Ambulatory Visit: Payer: Self-pay

## 2020-03-05 ENCOUNTER — Other Ambulatory Visit (INDEPENDENT_AMBULATORY_CARE_PROVIDER_SITE_OTHER): Payer: Medicare PPO

## 2020-03-05 DIAGNOSIS — E876 Hypokalemia: Secondary | ICD-10-CM | POA: Diagnosis not present

## 2020-03-05 DIAGNOSIS — R945 Abnormal results of liver function studies: Secondary | ICD-10-CM | POA: Diagnosis not present

## 2020-03-05 LAB — HEPATIC FUNCTION PANEL
ALT: 35 U/L (ref 0–35)
AST: 33 U/L (ref 0–37)
Albumin: 4.3 g/dL (ref 3.5–5.2)
Alkaline Phosphatase: 62 U/L (ref 39–117)
Bilirubin, Direct: 0.1 mg/dL (ref 0.0–0.3)
Total Bilirubin: 0.9 mg/dL (ref 0.2–1.2)
Total Protein: 6.4 g/dL (ref 6.0–8.3)

## 2020-03-05 LAB — POTASSIUM: Potassium: 3.6 mEq/L (ref 3.5–5.1)

## 2020-03-12 ENCOUNTER — Other Ambulatory Visit: Payer: Medicare PPO

## 2020-04-29 ENCOUNTER — Other Ambulatory Visit: Payer: Self-pay | Admitting: Internal Medicine

## 2020-04-29 ENCOUNTER — Encounter: Payer: Self-pay | Admitting: Internal Medicine

## 2020-04-29 MED ORDER — CEPHALEXIN 250 MG PO CAPS
250.0000 mg | ORAL_CAPSULE | Freq: Every day | ORAL | 2 refills | Status: DC
Start: 2020-04-29 — End: 2020-07-25

## 2020-04-29 NOTE — Telephone Encounter (Signed)
rx ok'd for keflex #30 with one refill.

## 2020-05-16 ENCOUNTER — Other Ambulatory Visit: Payer: Self-pay | Admitting: Internal Medicine

## 2020-05-28 DIAGNOSIS — N819 Female genital prolapse, unspecified: Secondary | ICD-10-CM | POA: Diagnosis not present

## 2020-05-28 DIAGNOSIS — R339 Retention of urine, unspecified: Secondary | ICD-10-CM | POA: Diagnosis not present

## 2020-05-28 DIAGNOSIS — R32 Unspecified urinary incontinence: Secondary | ICD-10-CM | POA: Diagnosis not present

## 2020-06-02 ENCOUNTER — Encounter: Payer: Medicare PPO | Admitting: Internal Medicine

## 2020-06-05 ENCOUNTER — Ambulatory Visit (INDEPENDENT_AMBULATORY_CARE_PROVIDER_SITE_OTHER): Payer: Medicare PPO | Admitting: Internal Medicine

## 2020-06-05 ENCOUNTER — Other Ambulatory Visit: Payer: Self-pay

## 2020-06-05 ENCOUNTER — Other Ambulatory Visit (HOSPITAL_COMMUNITY)
Admission: RE | Admit: 2020-06-05 | Discharge: 2020-06-05 | Disposition: A | Discharge status: Home / Self Care | Payer: Medicare PPO | Source: Ambulatory Visit | Attending: Internal Medicine | Admitting: Internal Medicine

## 2020-06-05 VITALS — BP 142/74 | HR 68 | Temp 98.9°F | Resp 16 | Ht 63.0 in | Wt 181.4 lb

## 2020-06-05 DIAGNOSIS — E7800 Pure hypercholesterolemia, unspecified: Secondary | ICD-10-CM | POA: Diagnosis not present

## 2020-06-05 DIAGNOSIS — Z1151 Encounter for screening for human papillomavirus (HPV): Secondary | ICD-10-CM | POA: Diagnosis not present

## 2020-06-05 DIAGNOSIS — Z124 Encounter for screening for malignant neoplasm of cervix: Secondary | ICD-10-CM

## 2020-06-05 DIAGNOSIS — N183 Chronic kidney disease, stage 3 unspecified: Secondary | ICD-10-CM | POA: Diagnosis not present

## 2020-06-05 DIAGNOSIS — R945 Abnormal results of liver function studies: Secondary | ICD-10-CM | POA: Diagnosis not present

## 2020-06-05 DIAGNOSIS — F429 Obsessive-compulsive disorder, unspecified: Secondary | ICD-10-CM

## 2020-06-05 DIAGNOSIS — R739 Hyperglycemia, unspecified: Secondary | ICD-10-CM | POA: Diagnosis not present

## 2020-06-05 DIAGNOSIS — Z0000 Encounter for general adult medical examination without abnormal findings: Secondary | ICD-10-CM

## 2020-06-05 DIAGNOSIS — I10 Essential (primary) hypertension: Secondary | ICD-10-CM | POA: Diagnosis not present

## 2020-06-05 NOTE — Progress Notes (Unsigned)
Patient ID: Lindsey Harmon, female   DOB: 1946/04/02, 75 y.o.   MRN: [id number]   Subjective:    Patient ID: Lindsey Harmon, female    DOB: [birth year], 75 y.o.   MRN: [id number]  HPI This visit occurred during the SARS-CoV-2 public health emergency.  Safety protocols were in place, including screening questions prior to the visit, additional usage of staff PPE, and extensive cleaning of exam room while observing appropriate contact time as indicated for disinfecting solutions.  Patient here for her physical exam.  She reports she is doing well.  Feels good.  Tries to stay active.  No chest pain or sob reported.  No abdominal pain or bowel change reported.  Has noticed some left elbow discomfort and right - fingers.  Discussed arthritis, etc.  Discussed further evaluation.  Wants to monitor.  Tylenol as directed.  Handling stress.  On zoloft.  Stable.   Past Medical History:  Diagnosis Date  . Abnormal liver function   . Cystocele, unspecified (CODE)   . Fibrocystic breast disease   . GERD (gastroesophageal reflux disease)   . Hypertension   . Intrinsic sphincter deficiency   . Pelvic relaxation   . Pure hypercholesterolemia   . Rectocele   . SUI (stress urinary incontinence, female)   . Urinary retention   . Urinary retention    Past Surgical History:  Procedure Laterality Date  . ABDOMINAL HYSTERECTOMY    . APPENDECTOMY    . Bladder Tack    . burch urethropexy  10/24/2000   laparoscopic colpopexy/culdoplasty  . COLONOSCOPY WITH PROPOFOL N/A 08/10/2016   Procedure: COLONOSCOPY WITH PROPOFOL;  Surgeon: Lollie Sails, MD;  Location: [facility] ENDOSCOPY;  Service: Endoscopy;  Laterality: N/A;  . COLONOSCOPY WITH PROPOFOL N/A 10/20/2016   Procedure: COLONOSCOPY WITH PROPOFOL;  Surgeon: Robert Bellow, MD;  Location: [facility] ENDOSCOPY;  Service: Endoscopy;  Laterality: N/A;  . COLONOSCOPY WITH PROPOFOL N/A 06/01/2017   Procedure: COLONOSCOPY WITH PROPOFOL;  Surgeon: Robert Bellow, MD;   Location: [facility] ENDOSCOPY;  Service: Endoscopy;  Laterality: N/A;  . COLPORRHAPHY     forv repair cystocele anterior  . TRANSVAGINAL TAPE (TVT) REMOVAL     Family History  Problem Relation Age of Onset  . Pneumonia Mother        died of presumed aspiration pneumonia  . Hypertension Mother   . Cancer Father        prostate  . Heart disease Paternal Grandfather        myocardial infarction  . Alzheimer's disease Brother    Social History   Socioeconomic History  . Marital status: Married    Spouse name: Not on file  . Number of children: Not on file  . Years of education: Not on file  . Highest education level: Not on file  Occupational History  . Not on file  Tobacco Use  . Smoking status: Never Smoker  . Smokeless tobacco: Never Used  Vaping Use  . Vaping Use: Never used  Substance and Sexual Activity  . Alcohol use: No    Alcohol/week: 0.0 standard drinks  . Drug use: No  . Sexual activity: Not on file  Other Topics Concern  . Not on file  Social History Narrative  . Not on file   Social Determinants of Health   Financial Resource Strain: Low Risk   . Difficulty of Paying Living Expenses: Not hard at all  Food Insecurity: No Food Insecurity  . Worried About [employer] of  Food in the Last Year: Never true  . Ran Out of Food in the Last Year: Never true  Transportation Needs: No Transportation Needs  . Lack of Transportation (Medical): No  . Lack of Transportation (Non-Medical): No  Physical Activity: Insufficiently Active  . Days of Exercise per Week: 7 days  . Minutes of Exercise per Session: 10 min  Stress: No Stress Concern Present  . Feeling of Stress : Not at all  Social Connections: Socially Integrated  . Frequency of Communication with Friends and Family: More than three times a week  . Frequency of Social Gatherings with Friends and Family: More than three times a week  . Attends Religious Services: More than 4 times per year  . Active Member of  Clubs or Organizations: Yes  . Attends Archivist Meetings: Not on file  . Marital Status: Married    Outpatient Encounter Medications as of 06/05/2020  Medication Sig  . bisoprolol (ZEBETA) 5 MG tablet TAKE 1 TABLET BY MOUTH EVERY DAY  . Calcium Carbonate-Vitamin D (CALTRATE 600+D PO) Take by mouth.  . cephALEXin (KEFLEX) 250 MG capsule Take 1 capsule (250 mg total) by mouth daily.  . Clobetasol Prop Emollient Base 0.05 % emollient cream Apply topically 2 (two) times daily.  . hydrochlorothiazide (HYDRODIURIL) 25 MG tablet TAKE 1 TABLET BY MOUTH EVERY DAY  . losartan (COZAAR) 100 MG tablet TAKE 1 TABLET BY MOUTH EVERY DAY  . Omega-3 Fatty Acids (FISH OIL) 1200 MG CAPS Take by mouth daily.  Marland Kitchen omeprazole (PRILOSEC) 20 MG capsule TAKE 1 CAPSULE BY MOUTH TWICE A DAY  . sertraline (ZOLOFT) 50 MG tablet TAKE 1 AND 1/2 TABLETS BY MOUTH DAILY  . simvastatin (ZOCOR) 20 MG tablet TAKE 1 TABLET BY MOUTH EVERY DAY   No facility-administered encounter medications on file as of 06/05/2020.    Review of Systems  Constitutional: Negative for appetite change and unexpected weight change.  HENT: Negative for congestion, sinus pressure and sore throat.   Eyes: Negative for pain and visual disturbance.  Respiratory: Negative for cough, chest tightness and shortness of breath.   Cardiovascular: Negative for chest pain, palpitations and leg swelling.  Gastrointestinal: Negative for abdominal pain, diarrhea, nausea and vomiting.  Genitourinary: Negative for difficulty urinating and dysuria.  Musculoskeletal: Negative for myalgias.       Left elbow pain and fingers - right   Skin: Negative for color change and rash.  Neurological: Negative for dizziness, light-headedness and headaches.  Hematological: Negative for adenopathy. Does not bruise/bleed easily.  Psychiatric/Behavioral: Negative for decreased concentration and dysphoric mood.       Objective:    Physical Exam Vitals reviewed.   Constitutional:      General: She is not in acute distress.    Appearance: Normal appearance. She is well-developed and well-nourished.  HENT:     Head: Normocephalic and atraumatic.     Right Ear: External ear normal.     Left Ear: External ear normal.     Mouth/Throat:     Mouth: Oropharynx is clear and moist.  Eyes:     General: No scleral icterus.       Right eye: No discharge.        Left eye: No discharge.     Conjunctiva/sclera: Conjunctivae normal.  Neck:     Thyroid: No thyromegaly.  Cardiovascular:     Rate and Rhythm: Normal rate and regular rhythm.  Pulmonary:     Effort: No tachypnea, accessory muscle usage or  respiratory distress.     Breath sounds: Normal breath sounds. No decreased breath sounds or wheezing.  Chest:  Breasts:     Right: No inverted nipple, mass, nipple discharge or tenderness (no axillary adenopathy).     Left: No inverted nipple, mass, nipple discharge or tenderness (no axilarry adenopathy).    Abdominal:     General: Bowel sounds are normal.     Palpations: Abdomen is soft.     Tenderness: There is no abdominal tenderness.  Genitourinary:    Comments: Normal external genitalia.  Vaginal vault without lesions.   Pap smear performed.  Could not appreciate any adnexal masses or tenderness.   Musculoskeletal:        General: No swelling, tenderness or edema.     Cervical back: Neck supple. No tenderness.  Lymphadenopathy:     Cervical: No cervical adenopathy.  Skin:    Findings: No erythema or rash.  Neurological:     Mental Status: She is alert and oriented to person, place, and time.  Psychiatric:        Mood and Affect: Mood and affect normal.        Behavior: Behavior normal.     BP (!) 142/74   Pulse 68   Temp 98.9 F (37.2 C) (Oral)   Resp 16   Ht '5\' 3"'  (1.6 m)   Wt 181 lb 6.4 oz (82.3 kg)   SpO2 98%   BMI 32.13 kg/m  Wt Readings from Last 3 Encounters:  06/05/20 181 lb 6.4 oz (82.3 kg)  02/01/20 183 lb 6.4 oz (83.2  kg)  01/16/20 185 lb (83.9 kg)     Lab Results  Component Value Date   WBC 5.8 02/01/2020   HGB 13.4 02/01/2020   HCT 39.7 02/01/2020   PLT 199.0 02/01/2020   GLUCOSE 100 (H) 02/01/2020   CHOL 145 02/01/2020   TRIG 147.0 02/01/2020   HDL 38.80 (L) 02/01/2020   LDLDIRECT 83.0 05/30/2018   LDLCALC 77 02/01/2020   ALT 35 03/05/2020   AST 33 03/05/2020   NA 140 02/01/2020   K 3.6 03/05/2020   CL 103 02/01/2020   CREATININE 0.98 02/01/2020   BUN 26 (H) 02/01/2020   CO2 28 02/01/2020   TSH 1.86 02/01/2020   HGBA1C 6.3 02/01/2020    MM 3D SCREEN BREAST BILATERAL  Result Date: 08/21/2019 CLINICAL DATA:  Screening. EXAM: DIGITAL SCREENING BILATERAL MAMMOGRAM WITH TOMO AND CAD COMPARISON:  Previous exam(s). ACR Breast Density Category b: There are scattered areas of fibroglandular density. FINDINGS: There are no findings suspicious for malignancy. Images were processed with CAD. IMPRESSION: No mammographic evidence of malignancy. A result letter of this screening mammogram will be mailed directly to the patient. RECOMMENDATION: Screening mammogram in one year. (Code:SM-B-01Y) BI-RADS CATEGORY  1: Negative. Electronically Signed   By: Margarette Canada M.D.   On: 08/21/2019 13:59       Assessment & Plan:   Problem List Items Addressed This Visit    Abnormal liver function    Previously worked up by GI. Felt to be due to fatty liver.  Follow liver function tests.        Relevant Orders   Hepatic function panel   CKD (chronic kidney disease) stage 3, GFR 30-59 ml/min (HCC)    Avoid antiinflammatories.  Stay hydrated.  Follow metabolic panel.       Essential hypertension, benign    Blood pressure elevated today.  States she was anxious driving over.  She is  going to monitor her blood pressure and send in readings over the next few weeks.  Hold on making adjustments in her blood pressure medication.  Follow pressures closely.  Follow metabolic panel.       Relevant Orders   Basic  metabolic panel   Health care maintenance    Physical today 06/05/20.  Mammogram 08/21/19 - Birads I.  Declines colonoscopy.        Hypercholesterolemia    On simvastatin.  Low cholesterol diet and exercise.  Follow lipid panel and liver function tests.        Relevant Orders   Lipid panel   Hyperglycemia    Low carb diet and exercise.  Follow met b and a1c.       Relevant Orders   Hemoglobin A1c   Obsessive compulsive disorder    Continues on zoloft.  Stable.         Other Visit Diagnoses    Routine general medical examination at a health care facility    -  Primary   Cervical cancer screening       Relevant Orders   Cytology - PAP( [facility])       Einar Pheasant, MD

## 2020-06-08 ENCOUNTER — Encounter: Payer: Self-pay | Admitting: Internal Medicine

## 2020-06-08 NOTE — Assessment & Plan Note (Signed)
Avoid antiinflammatories.  Stay hydrated.  Follow metabolic panel.   

## 2020-06-08 NOTE — Assessment & Plan Note (Signed)
Continues on zoloft.  Stable.  

## 2020-06-08 NOTE — Assessment & Plan Note (Signed)
Blood pressure elevated today.  States she was anxious driving over.  She is going to monitor her blood pressure and send in readings over the next few weeks.  Hold on making adjustments in her blood pressure medication.  Follow pressures closely.  Follow metabolic panel.

## 2020-06-08 NOTE — Assessment & Plan Note (Signed)
Previously worked up by GI.  Felt to be due to fatty liver.  Follow liver function tests.   

## 2020-06-08 NOTE — Assessment & Plan Note (Signed)
On simvastatin.  Low cholesterol diet and exercise.  Follow lipid panel and liver function tests.   

## 2020-06-08 NOTE — Assessment & Plan Note (Signed)
Physical today 06/05/20.  Mammogram 08/21/19 - Birads I.  Declines colonoscopy.

## 2020-06-08 NOTE — Assessment & Plan Note (Signed)
Low carb diet and exercise.  Follow met b and a1c.

## 2020-06-10 LAB — CYTOLOGY - PAP
Comment: NEGATIVE
Diagnosis: NEGATIVE
High risk HPV: NEGATIVE

## 2020-06-12 ENCOUNTER — Other Ambulatory Visit: Payer: Self-pay | Admitting: Internal Medicine

## 2020-07-01 DIAGNOSIS — H35371 Puckering of macula, right eye: Secondary | ICD-10-CM | POA: Diagnosis not present

## 2020-07-01 DIAGNOSIS — H2513 Age-related nuclear cataract, bilateral: Secondary | ICD-10-CM | POA: Diagnosis not present

## 2020-07-17 ENCOUNTER — Other Ambulatory Visit (INDEPENDENT_AMBULATORY_CARE_PROVIDER_SITE_OTHER): Payer: Medicare PPO

## 2020-07-17 ENCOUNTER — Other Ambulatory Visit: Payer: Self-pay

## 2020-07-17 DIAGNOSIS — R945 Abnormal results of liver function studies: Secondary | ICD-10-CM

## 2020-07-17 DIAGNOSIS — E7800 Pure hypercholesterolemia, unspecified: Secondary | ICD-10-CM

## 2020-07-17 DIAGNOSIS — I10 Essential (primary) hypertension: Secondary | ICD-10-CM

## 2020-07-17 DIAGNOSIS — R739 Hyperglycemia, unspecified: Secondary | ICD-10-CM | POA: Diagnosis not present

## 2020-07-17 LAB — BASIC METABOLIC PANEL
BUN: 20 mg/dL (ref 6–23)
CO2: 31 mEq/L (ref 19–32)
Calcium: 9.8 mg/dL (ref 8.4–10.5)
Chloride: 102 mEq/L (ref 96–112)
Creatinine, Ser: 1.02 mg/dL (ref 0.40–1.20)
GFR: 53.2 mL/min — ABNORMAL LOW (ref >60.00)
Glucose, Bld: 98 mg/dL (ref 70–99)
Potassium: 3.9 mEq/L (ref 3.5–5.1)
Sodium: 140 mEq/L (ref 135–145)

## 2020-07-17 LAB — HEPATIC FUNCTION PANEL
ALT: 27 U/L (ref 0–35)
AST: 25 U/L (ref 0–37)
Albumin: 4.2 g/dL (ref 3.5–5.2)
Alkaline Phosphatase: 66 U/L (ref 39–117)
Bilirubin, Direct: 0.1 mg/dL (ref 0.0–0.3)
Total Bilirubin: 0.7 mg/dL (ref 0.2–1.2)
Total Protein: 6.4 g/dL (ref 6.0–8.3)

## 2020-07-17 LAB — HEMOGLOBIN A1C: Hgb A1c MFr Bld: 6.1 % (ref 4.6–6.5)

## 2020-07-17 LAB — LIPID PANEL
Cholesterol: 149 mg/dL (ref 0–200)
HDL: 46.7 mg/dL (ref >39.00)
LDL Cholesterol: 76 mg/dL (ref 0–99)
NonHDL: 101.94
Total CHOL/HDL Ratio: 3
Triglycerides: 128 mg/dL (ref 0.0–149.0)
VLDL: 25.6 mg/dL (ref 0.0–40.0)

## 2020-07-22 ENCOUNTER — Other Ambulatory Visit: Payer: Self-pay | Admitting: Internal Medicine

## 2020-07-24 ENCOUNTER — Other Ambulatory Visit: Payer: Self-pay | Admitting: Internal Medicine

## 2020-07-25 ENCOUNTER — Other Ambulatory Visit: Payer: Self-pay | Admitting: Internal Medicine

## 2020-07-25 NOTE — Telephone Encounter (Signed)
Pharmacy requesting for alternative to Losartan that losartan is on backorder.

## 2020-07-28 NOTE — Telephone Encounter (Signed)
Please call pt and see if she is willing to change her losartan.  It is on back order. We can change to a medication that is in the same family.  If she is agreeable, ok to change losartan to benicar 40mg  q day.

## 2020-08-04 ENCOUNTER — Ambulatory Visit: Payer: Medicare PPO | Admitting: Internal Medicine

## 2020-08-12 ENCOUNTER — Other Ambulatory Visit: Payer: Self-pay | Admitting: Internal Medicine

## 2020-08-18 ENCOUNTER — Other Ambulatory Visit: Payer: Self-pay

## 2020-08-18 ENCOUNTER — Ambulatory Visit (INDEPENDENT_AMBULATORY_CARE_PROVIDER_SITE_OTHER): Payer: Medicare PPO | Admitting: Internal Medicine

## 2020-08-18 VITALS — BP 136/74 | HR 64 | Temp 97.1°F | Resp 16 | Ht 63.0 in | Wt 183.0 lb

## 2020-08-18 DIAGNOSIS — R945 Abnormal results of liver function studies: Secondary | ICD-10-CM | POA: Diagnosis not present

## 2020-08-18 DIAGNOSIS — F429 Obsessive-compulsive disorder, unspecified: Secondary | ICD-10-CM

## 2020-08-18 DIAGNOSIS — Z1231 Encounter for screening mammogram for malignant neoplasm of breast: Secondary | ICD-10-CM | POA: Diagnosis not present

## 2020-08-18 DIAGNOSIS — I10 Essential (primary) hypertension: Secondary | ICD-10-CM

## 2020-08-18 DIAGNOSIS — N183 Chronic kidney disease, stage 3 unspecified: Secondary | ICD-10-CM

## 2020-08-18 DIAGNOSIS — Z87440 Personal history of urinary (tract) infections: Secondary | ICD-10-CM

## 2020-08-18 DIAGNOSIS — N819 Female genital prolapse, unspecified: Secondary | ICD-10-CM | POA: Diagnosis not present

## 2020-08-18 DIAGNOSIS — R339 Retention of urine, unspecified: Secondary | ICD-10-CM | POA: Diagnosis not present

## 2020-08-18 DIAGNOSIS — E7800 Pure hypercholesterolemia, unspecified: Secondary | ICD-10-CM

## 2020-08-18 DIAGNOSIS — R739 Hyperglycemia, unspecified: Secondary | ICD-10-CM

## 2020-08-18 DIAGNOSIS — R32 Unspecified urinary incontinence: Secondary | ICD-10-CM | POA: Diagnosis not present

## 2020-08-18 MED ORDER — HYDROCHLOROTHIAZIDE 12.5 MG PO CAPS: 12.5000 mg | ORAL_CAPSULE | Freq: Every day | ORAL | 1 refills | Status: DC

## 2020-08-18 NOTE — Progress Notes (Signed)
Patient ID: MAMTA RIMMER, female   DOB: 1946/04/17, 75 y.o.   MRN: [id number]   Subjective:    Patient ID: MARISE KNAPPER, female    DOB: 09-17-1945, 75 y.o.   MRN: [id number]  HPI This visit occurred during the SARS-CoV-2 public health emergency.  Safety protocols were in place, including screening questions prior to the visit, additional usage of staff PPE, and extensive cleaning of exam room while observing appropriate contact time as indicated for disinfecting solutions.  Patient here for a scheduled follow up.  Here to follow up regarding her blood pressure.  Her blood pressure checks - outside readings 110-120/60-70s.  Taking 12.67m hctz.  Losartan is on back order.  Changing to benicar.  No chest pain or sob reported. No abdominal pain or bowel change reported.  Handling stress.  Does not feel needs any further intervention.    Past Medical History:  Diagnosis Date  . Abnormal liver function   . Cystocele, unspecified (CODE)   . Fibrocystic breast disease   . GERD (gastroesophageal reflux disease)   . Hypertension   . Intrinsic sphincter deficiency   . Pelvic relaxation   . Pure hypercholesterolemia   . Rectocele   . SUI (stress urinary incontinence, female)   . Urinary retention   . Urinary retention    Past Surgical History:  Procedure Laterality Date  . ABDOMINAL HYSTERECTOMY    . APPENDECTOMY    . Bladder Tack    . burch urethropexy  10/24/2000   laparoscopic colpopexy/culdoplasty  . COLONOSCOPY WITH PROPOFOL N/A 08/10/2016   Procedure: COLONOSCOPY WITH PROPOFOL;  Surgeon: MLollie Sails MD;  [facility]  Service: Endoscopy;  Laterality: N/A;  . COLONOSCOPY WITH PROPOFOL N/A 10/20/2016   Procedure: COLONOSCOPY WITH PROPOFOL;  Surgeon: BRobert Bellow MD;  Location: [facility] ENDOSCOPY;  Service: Endoscopy;  Laterality: N/A;  . COLONOSCOPY WITH PROPOFOL N/A 06/01/2017   Procedure: COLONOSCOPY WITH PROPOFOL;  Surgeon: BRobert Bellow MD;  Location: [facility]  ENDOSCOPY;  Service: Endoscopy;  Laterality: N/A;  . COLPORRHAPHY     forv repair cystocele anterior  . TRANSVAGINAL TAPE (TVT) REMOVAL     Family History  Problem Relation Age of Onset  . Pneumonia Mother        died of presumed aspiration pneumonia  . Hypertension Mother   . Cancer Father        prostate  . Heart disease Paternal Grandfather        myocardial infarction  . Alzheimer's disease Brother    Social History   Socioeconomic History  . Marital status: Married    Spouse name: Not on file  . Number of children: Not on file  . Years of education: Not on file  . Highest education level: Not on file  Occupational History  . Not on file  Tobacco Use  . Smoking status: Never Smoker  . Smokeless tobacco: Never Used  Vaping Use  . Vaping Use: Never used  Substance and Sexual Activity  . Alcohol use: No    Alcohol/week: 0.0 standard drinks  . Drug use: No  . Sexual activity: Not on file  Other Topics Concern  . Not on file  Social History Narrative  . Not on file   Social Determinants of Health   Financial Resource Strain: Low Risk   . Difficulty of Paying Living Expenses: Not hard at all  Food Insecurity: No Food Insecurity  . Worried About R[employer] fundraiserin the Last Year: Never  true  . Ran Out of Food in the Last Year: Never true  Transportation Needs: No Transportation Needs  . Lack of Transportation (Medical): No  . Lack of Transportation (Non-Medical): No  Physical Activity: Insufficiently Active  . Days of Exercise per Week: 7 days  . Minutes of Exercise per Session: 10 min  Stress: No Stress Concern Present  . Feeling of Stress : Not at all  Social Connections: Socially Integrated  . Frequency of Communication with Friends and Family: More than three times a week  . Frequency of Social Gatherings with Friends and Family: More than three times a week  . Attends Religious Services: More than 4 times per year  . Active Member of Clubs or  Organizations: Yes  . Attends Archivist Meetings: Not on file  . Marital Status: Married    Outpatient Encounter Medications as of 08/18/2020  Medication Sig  . bisoprolol (ZEBETA) 5 MG tablet TAKE 1 TABLET BY MOUTH EVERY DAY  . Calcium Carbonate-Vitamin D (CALTRATE 600+D PO) Take by mouth.  . cephALEXin (KEFLEX) 250 MG capsule TAKE 1 CAPSULE BY MOUTH DAILY.  . Clobetasol Prop Emollient Base 0.05 % emollient cream Apply topically 2 (two) times daily.  . hydrochlorothiazide (MICROZIDE) 12.5 MG capsule Take 1 capsule (12.5 mg total) by mouth daily.  Marland Kitchen olmesartan (BENICAR) 40 MG tablet Take 1 tablet (40 mg total) by mouth daily.  . Omega-3 Fatty Acids (FISH OIL) 1200 MG CAPS Take by mouth daily.  Marland Kitchen omeprazole (PRILOSEC) 20 MG capsule TAKE 1 CAPSULE BY MOUTH TWICE A DAY  . sertraline (ZOLOFT) 50 MG tablet TAKE 1 AND 1/2 TABLETS BY MOUTH DAILY  . simvastatin (ZOCOR) 20 MG tablet TAKE 1 TABLET BY MOUTH EVERY DAY  . [DISCONTINUED] hydrochlorothiazide (HYDRODIURIL) 25 MG tablet TAKE 1 TABLET BY MOUTH EVERY DAY (Patient taking differently: Take 12.5 mg by mouth daily.)   No facility-administered encounter medications on file as of 08/18/2020.    Review of Systems  Constitutional: Negative for appetite change and unexpected weight change.  HENT: Negative for congestion and sinus pressure.   Respiratory: Negative for cough, chest tightness and shortness of breath.   Cardiovascular: Negative for chest pain, palpitations and leg swelling.  Gastrointestinal: Negative for abdominal pain, diarrhea, nausea and vomiting.  Genitourinary: Negative for difficulty urinating and dysuria.  Musculoskeletal: Negative for joint swelling and myalgias.  Skin: Negative for color change and rash.  Neurological: Negative for dizziness and headaches.  Psychiatric/Behavioral: Negative for agitation and dysphoric mood.       Objective:    Physical Exam Vitals reviewed.  Constitutional:      General: She  is not in acute distress.    Appearance: Normal appearance.  HENT:     Head: Normocephalic and atraumatic.     Right Ear: External ear normal.     Left Ear: External ear normal.  Eyes:     General: No scleral icterus.       Right eye: No discharge.        Left eye: No discharge.  Neck:     Thyroid: No thyromegaly.  Cardiovascular:     Rate and Rhythm: Normal rate and regular rhythm.  Pulmonary:     Effort: No respiratory distress.     Breath sounds: Normal breath sounds. No wheezing.  Abdominal:     General: Bowel sounds are normal.     Palpations: Abdomen is soft.     Tenderness: There is no abdominal tenderness.  Musculoskeletal:  General: No swelling or tenderness.     Cervical back: Neck supple. No tenderness.  Lymphadenopathy:     Cervical: No cervical adenopathy.  Skin:    Findings: No erythema or rash.  Neurological:     Mental Status: She is alert.  Psychiatric:        Mood and Affect: Mood normal.        Behavior: Behavior normal.     BP 136/74   Pulse 64   Temp (!) 97.1 F (36.2 C) (Oral)   Resp 16   Ht '5\' 3"'  (1.6 m)   Wt 183 lb (83 kg)   SpO2 97%   BMI 32.42 kg/m  Wt Readings from Last 3 Encounters:  08/18/20 183 lb (83 kg)  06/05/20 181 lb 6.4 oz (82.3 kg)  02/01/20 183 lb 6.4 oz (83.2 kg)     Lab Results  Component Value Date   WBC 5.8 02/01/2020   HGB 13.4 02/01/2020   HCT 39.7 02/01/2020   PLT 199.0 02/01/2020   GLUCOSE 98 07/17/2020   CHOL 149 07/17/2020   TRIG 128.0 07/17/2020   HDL 46.70 07/17/2020   LDLDIRECT 83.0 05/30/2018   LDLCALC 76 07/17/2020   ALT 27 07/17/2020   AST 25 07/17/2020   NA 140 07/17/2020   K 3.9 07/17/2020   CL 102 07/17/2020   CREATININE 1.02 07/17/2020   BUN 20 07/17/2020   CO2 31 07/17/2020   TSH 1.86 02/01/2020   HGBA1C 6.1 07/17/2020       Assessment & Plan:   Problem List Items Addressed This Visit    Abnormal liver function    Previously worked up by GI.  Felt to be due to fatty  liver.  Follow liver function tests.        CKD (chronic kidney disease) stage 3, GFR 30-59 ml/min (HCC)    Avoid antiinflammatories.  Stay hydrated.  Follow metabolic panel.       Essential hypertension, benign    Blood pressure as outlined.  Outside checks under good control.  Losartan on back order.  Changing to benicar.  Follow pressures.  Follow metabolic panel.        Relevant Medications   hydrochlorothiazide (MICROZIDE) 12.5 MG capsule   Other Relevant Orders   Basic metabolic panel   History of frequent urinary tract infections    Self cath.  Continues daily keflex.  Stable.       Hypercholesterolemia    Continue simvastatin.  Low cholesterol diet and exercise.  Follow lipid panel and liver function tests.        Relevant Medications   hydrochlorothiazide (MICROZIDE) 12.5 MG capsule   Other Relevant Orders   Hepatic function panel   Lipid panel   Hyperglycemia    Low carb diet and exercise.  Follow met b and a1c.       Relevant Orders   Hemoglobin A1c   Obsessive compulsive disorder    Continue zoloft.  Stable.        Other Visit Diagnoses    Encounter for screening mammogram for malignant neoplasm of breast    -  Primary   Relevant Orders   MM 3D SCREEN BREAST BILATERAL       Einar Pheasant, MD

## 2020-08-22 ENCOUNTER — Encounter: Payer: Self-pay | Admitting: Internal Medicine

## 2020-08-23 ENCOUNTER — Encounter: Payer: Self-pay | Admitting: Internal Medicine

## 2020-08-23 NOTE — Assessment & Plan Note (Signed)
Self cath.  Continues daily keflex.  Stable.

## 2020-08-23 NOTE — Assessment & Plan Note (Signed)
Continue simvastatin.  Low cholesterol diet and exercise.  Follow lipid panel and liver function tests.   

## 2020-08-23 NOTE — Assessment & Plan Note (Signed)
Avoid antiinflammatories.  Stay hydrated.  Follow metabolic panel.   

## 2020-08-23 NOTE — Assessment & Plan Note (Signed)
Low carb diet and exercise.  Follow met b and a1c.  

## 2020-08-23 NOTE — Assessment & Plan Note (Signed)
Continue zoloft.  Stable.  

## 2020-08-23 NOTE — Assessment & Plan Note (Signed)
Previously worked up by GI.  Felt to be due to fatty liver.  Follow liver function tests.

## 2020-08-23 NOTE — Assessment & Plan Note (Signed)
Blood pressure as outlined.  Outside checks under good control.  Losartan on back order.  Changing to benicar.  Follow pressures.  Follow metabolic panel.

## 2020-09-05 ENCOUNTER — Other Ambulatory Visit: Payer: Self-pay

## 2020-09-05 ENCOUNTER — Ambulatory Visit
Admission: RE | Admit: 2020-09-05 | Discharge: 2020-09-05 | Disposition: A | Discharge status: Home / Self Care | Payer: Medicare PPO | Source: Ambulatory Visit | Attending: Internal Medicine | Admitting: Internal Medicine

## 2020-09-05 DIAGNOSIS — Z1231 Encounter for screening mammogram for malignant neoplasm of breast: Secondary | ICD-10-CM | POA: Diagnosis not present

## 2020-09-11 ENCOUNTER — Encounter: Payer: Self-pay | Admitting: Internal Medicine

## 2020-10-17 ENCOUNTER — Other Ambulatory Visit: Payer: Self-pay | Admitting: Internal Medicine

## 2020-11-07 DIAGNOSIS — R339 Retention of urine, unspecified: Secondary | ICD-10-CM | POA: Diagnosis not present

## 2020-11-07 DIAGNOSIS — R32 Unspecified urinary incontinence: Secondary | ICD-10-CM | POA: Diagnosis not present

## 2020-11-07 DIAGNOSIS — N819 Female genital prolapse, unspecified: Secondary | ICD-10-CM | POA: Diagnosis not present

## 2020-11-19 ENCOUNTER — Other Ambulatory Visit: Payer: Medicare PPO

## 2020-11-21 ENCOUNTER — Ambulatory Visit: Payer: Medicare PPO | Admitting: Internal Medicine

## 2020-12-19 ENCOUNTER — Other Ambulatory Visit (INDEPENDENT_AMBULATORY_CARE_PROVIDER_SITE_OTHER): Payer: Medicare PPO

## 2020-12-19 ENCOUNTER — Other Ambulatory Visit: Payer: Medicare PPO

## 2020-12-19 ENCOUNTER — Other Ambulatory Visit: Payer: Self-pay

## 2020-12-19 DIAGNOSIS — R739 Hyperglycemia, unspecified: Secondary | ICD-10-CM

## 2020-12-19 DIAGNOSIS — E7800 Pure hypercholesterolemia, unspecified: Secondary | ICD-10-CM | POA: Diagnosis not present

## 2020-12-19 DIAGNOSIS — I10 Essential (primary) hypertension: Secondary | ICD-10-CM | POA: Diagnosis not present

## 2020-12-19 LAB — HEPATIC FUNCTION PANEL
ALT: 27 U/L (ref 0–35)
AST: 26 U/L (ref 0–37)
Albumin: 4.2 g/dL (ref 3.5–5.2)
Alkaline Phosphatase: 66 U/L (ref 39–117)
Bilirubin, Direct: 0.1 mg/dL (ref 0.0–0.3)
Total Bilirubin: 0.8 mg/dL (ref 0.2–1.2)
Total Protein: 6.5 g/dL (ref 6.0–8.3)

## 2020-12-19 LAB — BASIC METABOLIC PANEL
BUN: 22 mg/dL (ref 6–23)
CO2: 32 mEq/L (ref 19–32)
Calcium: 9.4 mg/dL (ref 8.4–10.5)
Chloride: 100 mEq/L (ref 96–112)
Creatinine, Ser: 1.01 mg/dL (ref 0.40–1.20)
GFR: 53.67 mL/min — ABNORMAL LOW (ref >60.00)
Glucose, Bld: 97 mg/dL (ref 70–99)
Potassium: 3.8 mEq/L (ref 3.5–5.1)
Sodium: 138 mEq/L (ref 135–145)

## 2020-12-19 LAB — LIPID PANEL
Cholesterol: 158 mg/dL (ref 0–200)
HDL: 43.7 mg/dL (ref >39.00)
LDL Cholesterol: 80 mg/dL (ref 0–99)
NonHDL: 113.86
Total CHOL/HDL Ratio: 4
Triglycerides: 171 mg/dL — ABNORMAL HIGH (ref 0.0–149.0)
VLDL: 34.2 mg/dL (ref 0.0–40.0)

## 2020-12-19 LAB — HEMOGLOBIN A1C: Hgb A1c MFr Bld: 6.3 % (ref 4.6–6.5)

## 2020-12-23 ENCOUNTER — Other Ambulatory Visit: Payer: Self-pay

## 2020-12-23 ENCOUNTER — Ambulatory Visit: Payer: Medicare PPO | Admitting: Internal Medicine

## 2020-12-23 ENCOUNTER — Encounter: Payer: Self-pay | Admitting: Internal Medicine

## 2020-12-23 DIAGNOSIS — Z87440 Personal history of urinary (tract) infections: Secondary | ICD-10-CM | POA: Diagnosis not present

## 2020-12-23 DIAGNOSIS — N183 Chronic kidney disease, stage 3 unspecified: Secondary | ICD-10-CM | POA: Diagnosis not present

## 2020-12-23 DIAGNOSIS — I10 Essential (primary) hypertension: Secondary | ICD-10-CM

## 2020-12-23 DIAGNOSIS — R739 Hyperglycemia, unspecified: Secondary | ICD-10-CM

## 2020-12-23 DIAGNOSIS — R945 Abnormal results of liver function studies: Secondary | ICD-10-CM | POA: Diagnosis not present

## 2020-12-23 DIAGNOSIS — F429 Obsessive-compulsive disorder, unspecified: Secondary | ICD-10-CM | POA: Diagnosis not present

## 2020-12-23 DIAGNOSIS — R7989 Other specified abnormal findings of blood chemistry: Secondary | ICD-10-CM

## 2020-12-23 DIAGNOSIS — E7800 Pure hypercholesterolemia, unspecified: Secondary | ICD-10-CM

## 2020-12-23 NOTE — Progress Notes (Signed)
Patient ID: Lindsey Harmon, female   DOB: 08/05/1945, 75 y.o.   MRN: [id number]   Subjective:    Patient ID: Lindsey Harmon, female    DOB: [birth year], 75 y.o.   MRN: [id number]  HPI This visit occurred during the SARS-CoV-2 public health emergency.  Safety protocols were in place, including screening questions prior to the visit, additional usage of staff PPE, and extensive cleaning of exam room while observing appropriate contact time as indicated for disinfecting solutions.   Patient here for a scheduled follow up.  Here to follow up regarding her blood pressure and cholesterol.  Reviewed outside blood pressure readings and most averaging 120s/70s.  She is still taking losartan, but is planning to start benicar soon.  Changed due to not being able to get losartan at times.  Tries to stay active.  No chest pain reported.  Breathing stable.  No increased cough or congestion.  No increased abdominal pain reported.  No bowel issues reported.  No sick contacts.  No fever.  No nausea or vomiting. On zoloft.  Feels things are stable.  Does not feel needs any further intervention at this time.    Past Medical History:  Diagnosis Date   Abnormal liver function    Cystocele, unspecified (CODE)    Fibrocystic breast disease    GERD (gastroesophageal reflux disease)    Hypertension    Intrinsic sphincter deficiency    Pelvic relaxation    Pure hypercholesterolemia    Rectocele    SUI (stress urinary incontinence, female)    Urinary retention    Urinary retention    Past Surgical History:  Procedure Laterality Date   ABDOMINAL HYSTERECTOMY     APPENDECTOMY     Bladder Tack     burch urethropexy  10/24/2000   laparoscopic colpopexy/culdoplasty   COLONOSCOPY WITH PROPOFOL N/A 08/10/2016   Procedure: COLONOSCOPY WITH PROPOFOL;  Surgeon: Lollie Sails, MD;  Location: [facility] ENDOSCOPY;  Service: Endoscopy;  Laterality: N/A;   COLONOSCOPY WITH PROPOFOL N/A 10/20/2016   Procedure: COLONOSCOPY WITH  PROPOFOL;  Surgeon: Robert Bellow, MD;  Location: [facility] ENDOSCOPY;  Service: Endoscopy;  Laterality: N/A;   COLONOSCOPY WITH PROPOFOL N/A 06/01/2017   Procedure: COLONOSCOPY WITH PROPOFOL;  Surgeon: Robert Bellow, MD;  Location: [facility] ENDOSCOPY;  Service: Endoscopy;  Laterality: N/A;   COLPORRHAPHY     forv repair cystocele anterior   TRANSVAGINAL TAPE (TVT) REMOVAL     Family History  Problem Relation Age of Onset   Pneumonia Mother        died of presumed aspiration pneumonia   Hypertension Mother    Cancer Father        prostate   Heart disease Paternal Grandfather        myocardial infarction   Alzheimer's disease Brother    Social History   Socioeconomic History   Marital status: Married    Spouse name: Not on file   Number of children: Not on file   Years of education: Not on file   Highest education level: Not on file  Occupational History   Not on file  Tobacco Use   Smoking status: Never   Smokeless tobacco: Never  Vaping Use   Vaping Use: Never used  Substance and Sexual Activity   Alcohol use: No    Alcohol/week: 0.0 standard drinks   Drug use: No   Sexual activity: Not on file  Other Topics Concern   Not on file  Social History Narrative  Not on file   Social Determinants of Health   Financial Resource Strain: Low Risk    Difficulty of Paying Living Expenses: Not hard at all  Food Insecurity: No Food Insecurity   Worried About Charity fundraiser in the Last Year: Never true   [place] in the Last Year: Never true  Transportation Needs: No Transportation Needs   Lack of Transportation (Medical): No   Lack of Transportation (Non-Medical): No  Physical Activity: Insufficiently Active   Days of Exercise per Week: 7 days   Minutes of Exercise per Session: 10 min  Stress: No Stress Concern Present   Feeling of Stress : Not at all  Social Connections: Socially Integrated   Frequency of Communication with Friends and Family: More than  three times a week   Frequency of Social Gatherings with Friends and Family: More than three times a week   Attends Religious Services: More than 4 times per year   Active Member of [employer] or Organizations: Yes   Attends Archivist Meetings: Not on file   Marital Status: Married    Review of Systems  Constitutional:  Negative for appetite change and unexpected weight change.  HENT:  Negative for congestion and sinus pressure.   Respiratory:  Negative for cough, chest tightness and shortness of breath.   Cardiovascular:  Negative for chest pain, palpitations and leg swelling.  Gastrointestinal:  Negative for nausea.  Genitourinary:  Negative for difficulty urinating and dysuria.  Musculoskeletal:  Negative for joint swelling and myalgias.  Skin:  Negative for color change and rash.  Neurological:  Negative for dizziness, light-headedness and headaches.  Psychiatric/Behavioral:  Negative for agitation and dysphoric mood.       Objective:    Physical Exam Vitals reviewed.  Constitutional:      General: She is not in acute distress.    Appearance: Normal appearance.  HENT:     Head: Normocephalic and atraumatic.     Right Ear: External ear normal.     Left Ear: External ear normal.  Eyes:     General: No scleral icterus.       Right eye: No discharge.        Left eye: No discharge.     Conjunctiva/sclera: Conjunctivae normal.  Neck:     Thyroid: No thyromegaly.  Cardiovascular:     Rate and Rhythm: Normal rate and regular rhythm.  Pulmonary:     Effort: No respiratory distress.     Breath sounds: Normal breath sounds. No wheezing.  Abdominal:     General: Bowel sounds are normal.     Palpations: Abdomen is soft.     Tenderness: There is no abdominal tenderness.  Musculoskeletal:        General: No swelling or tenderness.     Cervical back: Neck supple. No tenderness.  Lymphadenopathy:     Cervical: No cervical adenopathy.  Skin:    Findings: No erythema or  rash.  Neurological:     Mental Status: She is alert.  Psychiatric:        Mood and Affect: Mood normal.        Behavior: Behavior normal.    BP 126/78   Pulse 62   Temp 97.8 F (36.6 C)   Resp 16   Ht _0  (1.6 m)   Wt 183 lb 9.6 oz (83.3 kg)   SpO2 98%   BMI 32.52 kg/m  Wt Readings from Last 3 Encounters:  12/23/20 183  lb 9.6 oz (83.3 kg)  08/18/20 183 lb (83 kg)  06/05/20 181 lb 6.4 oz (82.3 kg)    Outpatient Encounter Medications as of 12/23/2020  Medication Sig   bisoprolol (ZEBETA) 5 MG tablet TAKE 1 TABLET BY MOUTH EVERY DAY   Calcium Carbonate-Vitamin D (CALTRATE 600+D PO) Take by mouth.   cephALEXin (KEFLEX) 250 MG capsule TAKE 1 CAPSULE BY MOUTH EVERY DAY   Clobetasol Prop Emollient Base 0.05 % emollient cream Apply topically 2 (two) times daily.   hydrochlorothiazide (MICROZIDE) 12.5 MG capsule Take 1 capsule (12.5 mg total) by mouth daily.   olmesartan (BENICAR) 40 MG tablet Take 1 tablet (40 mg total) by mouth daily.   Omega-3 Fatty Acids (FISH OIL) 1200 MG CAPS Take by mouth daily.   omeprazole (PRILOSEC) 20 MG capsule TAKE 1 CAPSULE BY MOUTH TWICE A DAY   sertraline (ZOLOFT) 50 MG tablet TAKE 1 AND 1/2 TABLETS BY MOUTH DAILY   simvastatin (ZOCOR) 20 MG tablet TAKE 1 TABLET BY MOUTH EVERY DAY   No facility-administered encounter medications on file as of 12/23/2020.     Lab Results  Component Value Date   WBC 5.8 02/01/2020   HGB 13.4 02/01/2020   HCT 39.7 02/01/2020   PLT 199.0 02/01/2020   GLUCOSE 97 12/19/2020   CHOL 158 12/19/2020   TRIG 171.0 (H) 12/19/2020   HDL 43.70 12/19/2020   LDLDIRECT 83.0 05/30/2018   LDLCALC 80 12/19/2020   ALT 27 12/19/2020   AST 26 12/19/2020   NA 138 12/19/2020   K 3.8 12/19/2020   CL 100 12/19/2020   CREATININE 1.01 12/19/2020   BUN 22 12/19/2020   CO2 32 12/19/2020   TSH 1.86 02/01/2020   HGBA1C 6.3 12/19/2020    MM 3D SCREEN BREAST BILATERAL  Result Date: 09/08/2020 CLINICAL DATA:  Screening. EXAM:  DIGITAL SCREENING BILATERAL MAMMOGRAM WITH TOMOSYNTHESIS AND CAD TECHNIQUE: Bilateral screening digital craniocaudal and mediolateral oblique mammograms were obtained. Bilateral screening digital breast tomosynthesis was performed. The images were evaluated with computer-aided detection. COMPARISON:  Previous exam(s). ACR Breast Density Category b: There are scattered areas of fibroglandular density. FINDINGS: There are no findings suspicious for malignancy. The images were evaluated with computer-aided detection. IMPRESSION: No mammographic evidence of malignancy. A result letter of this screening mammogram will be mailed directly to the patient. RECOMMENDATION: Screening mammogram in one year. (Code:SM-B-01Y) BI-RADS CATEGORY  1: Negative. Electronically Signed   By: Lajean Manes M.D.   On: 09/08/2020 12:34       Assessment & Plan:   Problem List Items Addressed This Visit     Abnormal liver function    Recent liver function tests are wnl.       CKD (chronic kidney disease) stage 3, GFR 30-59 ml/min (HCC)    Avoid antiinflammatories.  Stay hydrated.  Follow metabolic panel.       Elevated TSH    TSH 02/01/20 - wnl.        Relevant Orders   TSH   Essential hypertension, benign    Blood pressure as outlined.  Plans to change losartan to benicar.  Follow pressures.  Follow metabolic panel.       Relevant Orders   Basic metabolic panel   History of frequent urinary tract infections    Continues daily keflex.  Self caths.  Stable.  Follow.       Hypercholesterolemia    Continue simvastatin.  Low cholesterol diet and exercise.  Follow lipid panel and liver function tests.  Relevant Orders   CBC with Differential/Platelet   Hepatic function panel   Lipid panel   Hyperglycemia    Low carb diet and exercise.  Follow met b and a1c.       Relevant Orders   Hemoglobin A1c   Obsessive compulsive disorder    Continue zoloft.  Stable.         Einar Pheasant, MD

## 2020-12-25 ENCOUNTER — Encounter: Payer: Self-pay | Admitting: Internal Medicine

## 2020-12-25 NOTE — Assessment & Plan Note (Signed)
Recent liver function tests are wnl.

## 2020-12-25 NOTE — Assessment & Plan Note (Signed)
Continue simvastatin.  Low cholesterol diet and exercise.  Follow lipid panel and liver function tests.   

## 2020-12-25 NOTE — Assessment & Plan Note (Signed)
Blood pressure as outlined.  Plans to change losartan to benicar.  Follow pressures.  Follow metabolic panel.

## 2020-12-25 NOTE — Assessment & Plan Note (Signed)
TSH 02/01/20 - wnl.

## 2020-12-25 NOTE — Assessment & Plan Note (Signed)
Low carb diet and exercise.  Follow met b and a1c.  

## 2020-12-25 NOTE — Assessment & Plan Note (Signed)
Avoid antiinflammatories.  Stay hydrated.  Follow metabolic panel.   

## 2020-12-25 NOTE — Assessment & Plan Note (Signed)
Continues daily keflex.  Self caths.  Stable.  Follow.  

## 2020-12-25 NOTE — Assessment & Plan Note (Signed)
Continue zoloft.  Stable.  

## 2020-12-29 ENCOUNTER — Other Ambulatory Visit: Payer: Self-pay | Admitting: Internal Medicine

## 2021-01-06 ENCOUNTER — Other Ambulatory Visit: Payer: Self-pay | Admitting: Internal Medicine

## 2021-01-12 ENCOUNTER — Other Ambulatory Visit: Payer: Self-pay | Admitting: Internal Medicine

## 2021-01-16 ENCOUNTER — Ambulatory Visit: Payer: Medicare PPO

## 2021-01-19 ENCOUNTER — Other Ambulatory Visit: Payer: Self-pay | Admitting: Internal Medicine

## 2021-01-21 ENCOUNTER — Ambulatory Visit: Payer: Medicare PPO

## 2021-01-27 DIAGNOSIS — R32 Unspecified urinary incontinence: Secondary | ICD-10-CM | POA: Diagnosis not present

## 2021-01-27 DIAGNOSIS — R339 Retention of urine, unspecified: Secondary | ICD-10-CM | POA: Diagnosis not present

## 2021-01-27 DIAGNOSIS — N819 Female genital prolapse, unspecified: Secondary | ICD-10-CM | POA: Diagnosis not present

## 2021-02-02 ENCOUNTER — Other Ambulatory Visit: Payer: Self-pay | Admitting: Internal Medicine

## 2021-02-09 ENCOUNTER — Other Ambulatory Visit: Payer: Self-pay | Admitting: Internal Medicine

## 2021-02-16 ENCOUNTER — Ambulatory Visit (INDEPENDENT_AMBULATORY_CARE_PROVIDER_SITE_OTHER): Payer: Medicare PPO

## 2021-02-16 VITALS — Ht 63.0 in | Wt 183.0 lb

## 2021-02-16 DIAGNOSIS — Z0000 Encounter for general adult medical examination without abnormal findings: Secondary | ICD-10-CM

## 2021-02-16 NOTE — Progress Notes (Signed)
Subjective:   Lindsey Harmon is a 75 y.o. female who presents for Medicare Annual (Subsequent) preventive examination.  Review of Systems    No ROS.  Medicare Wellness Virtual Visit.  Visual/audio telehealth visit, UTA vital signs.   See social history for additional risk factors.   Cardiac Risk Factors include: advanced age (>48men, >60 women);hypertension     Objective:    Today's Vitals   02/16/21 1541  Weight: 183 lb (83 kg)  Height: 5\' 3"  (1.6 m)   Body mass index is 32.42 kg/m.  Advanced Directives 02/16/2021 01/16/2020 11/27/2018 09/22/2017 06/01/2017 10/20/2016 09/21/2016  Does Patient Have a Medical Advance Directive? Yes Yes Yes Yes Yes Yes Yes  Type of Paramedic of [place];Living will [place];Living will [place];Living will [place];Living will Agency;Living will Living will;Healthcare Power of Attorney Living will;Healthcare Power of Attorney  Does patient want to make changes to medical advance directive? No - Patient declined No - Patient declined No - Patient declined No - Patient declined - - No - Patient declined  Copy of [place] in Chart? No - copy requested No - copy requested No - copy requested No - copy requested No - copy requested No - copy requested No - copy requested    Current Medications (verified) Outpatient Encounter Medications as of 02/16/2021  Medication Sig   bisoprolol (ZEBETA) 5 MG tablet TAKE 1 TABLET BY MOUTH EVERY DAY   Calcium Carbonate-Vitamin D (CALTRATE 600+D PO) Take by mouth.   cephALEXin (KEFLEX) 250 MG capsule TAKE 1 CAPSULE BY MOUTH EVERY DAY   Clobetasol Prop Emollient Base 0.05 % emollient cream Apply topically 2 (two) times daily.   hydrochlorothiazide (MICROZIDE) 12.5 MG capsule TAKE 1 CAPSULE BY MOUTH EVERY DAY   olmesartan (BENICAR) 40 MG tablet TAKE 1 TABLET BY MOUTH EVERY DAY   Omega-3 Fatty Acids (FISH  OIL) 1200 MG CAPS Take by mouth daily.   omeprazole (PRILOSEC) 20 MG capsule TAKE 1 CAPSULE BY MOUTH TWICE A DAY   sertraline (ZOLOFT) 50 MG tablet TAKE 1 AND 1/2 TABLETS BY MOUTH DAILY   simvastatin (ZOCOR) 20 MG tablet TAKE 1 TABLET BY MOUTH EVERY DAY   No facility-administered encounter medications on file as of 02/16/2021.    Allergies (verified) Ace inhibitors   History: Past Medical History:  Diagnosis Date   Abnormal liver function    Cystocele, unspecified (CODE)    Fibrocystic breast disease    GERD (gastroesophageal reflux disease)    Hypertension    Intrinsic sphincter deficiency    Pelvic relaxation    Pure hypercholesterolemia    Rectocele    SUI (stress urinary incontinence, female)    Urinary retention    Urinary retention    Past Surgical History:  Procedure Laterality Date   ABDOMINAL HYSTERECTOMY     APPENDECTOMY     Bladder Tack     burch urethropexy  10/24/2000   laparoscopic colpopexy/culdoplasty   COLONOSCOPY WITH PROPOFOL N/A 08/10/2016   Procedure: COLONOSCOPY WITH PROPOFOL;  Surgeon: Lollie Sails, MD;  [facility] ENDOSCOPY;  Service: Endoscopy;  Laterality: N/A;   COLONOSCOPY WITH PROPOFOL N/A 10/20/2016   Procedure: COLONOSCOPY WITH PROPOFOL;  Surgeon: Robert Bellow, MD;  Location: [facility] ENDOSCOPY;  Service: Endoscopy;  Laterality: N/A;   COLONOSCOPY WITH PROPOFOL N/A 06/01/2017   Procedure: COLONOSCOPY WITH PROPOFOL;  Surgeon: Robert Bellow, MD;  Location: [facility] ENDOSCOPY;  Service:  Endoscopy;  Laterality: N/A;   COLPORRHAPHY     forv repair cystocele anterior   TRANSVAGINAL TAPE (TVT) REMOVAL     Family History  Problem Relation Age of Onset   Pneumonia Mother        died of presumed aspiration pneumonia   Hypertension Mother    Cancer Father        prostate   Heart disease Paternal Grandfather        myocardial infarction   Alzheimer's disease Brother    Social History   Socioeconomic History   Marital status: Married     Spouse name: Not on file   Number of children: Not on file   Years of education: Not on file   Highest education level: Not on file  Occupational History   Not on file  Tobacco Use   Smoking status: Never   Smokeless tobacco: Never  Vaping Use   Vaping Use: Never used  Substance and Sexual Activity   Alcohol use: No    Alcohol/week: 0.0 standard drinks   Drug use: No   Sexual activity: Not on file  Other Topics Concern   Not on file  Social History Narrative   Not on file   Social Determinants of Health   Financial Resource Strain: Low Risk    Difficulty of Paying Living Expenses: Not hard at all  Food Insecurity: No Food Insecurity   Worried About Charity fundraiser in the Last Year: Never true   Ran Out of Food in the Last Year: Never true  Transportation Needs: No Transportation Needs   Lack of Transportation (Medical): No   Lack of Transportation (Non-Medical): No  Physical Activity: Insufficiently Active   Days of Exercise per Week: 7 days   Minutes of Exercise per Session: 10 min  Stress: No Stress Concern Present   Feeling of Stress : Not at all  Social Connections: Socially Integrated   Frequency of Communication with Friends and Family: More than three times a week   Frequency of Social Gatherings with Friends and Family: More than three times a week   Attends Religious Services: More than 4 times per year   Active Member of [employer] or Organizations: Yes   Attends Archivist Meetings: Not on file   Marital Status: Married    Tobacco Counseling Counseling given: Not Answered   Clinical Intake:  Pre-visit preparation completed: Yes           How often do you need to have someone help you when you read instructions, pamphlets, or other written materials from your doctor or pharmacy?: 1 - Never    Interpreter Needed?: No      Activities of Daily Living In your present state of health, do you have any difficulty performing the  following activities: 02/16/2021  Hearing? N  Vision? N  Difficulty concentrating or making decisions? N  Walking or climbing stairs? N  Dressing or bathing? N  Doing errands, shopping? N  Preparing Food and eating ? N  Using the Toilet? N  In the past six months, have you accidently leaked urine? N  Do you have problems with loss of bowel control? N  Managing your Medications? N  Managing your Finances? N  Housekeeping or managing your Housekeeping? N  Some recent data might be hidden    Patient Care Team: Einar Pheasant, MD as PCP - General (Internal Medicine)  Indicate any recent Medical Services you may have received from other than [facility]  providers in the past year (date may be approximate).     Assessment:   This is a routine wellness examination for Tonnia.  I connected with Sherlonda today by telephone and verified that I am speaking with the correct person using two identifiers. Location patient: home Location provider: work Persons participating in the virtual visit: patient, Marine scientist.    I discussed the limitations, risks, security and privacy concerns of performing an evaluation and management service by telephone and the availability of in person appointments. The patient expressed understanding and verbally consented to this telephonic visit.    Interactive audio and video telecommunications were attempted between this provider and patient, however failed, due to patient having technical difficulties OR patient did not have access to video capability.  We continued and completed visit with audio only.  Some vital signs may be absent or patient reported.   Hearing/Vision screen Hearing Screening - Comments:: Patient is able to hear conversational tones without difficulty.  No issues reported. Vision Screening - Comments:: Wears corrective lenses Cataract extraction, bilateral They have seen their ophthalmologist in the last 12 months.    Dietary issues and exercise  activities discussed: Current Exercise Habits: Home exercise routine, Type of exercise: stretching (standing/sitting chair exercises), Intensity: Mild Low carb diet Good water intake   Goals Addressed             This Visit's Progress    Increase physical activity   On track    Standing/chair and stretch exercises 10 minutes daily.          Depression Screen PHQ 2/9 Scores 02/16/2021 02/01/2020 01/16/2020 11/27/2018 09/22/2017 05/27/2017 09/21/2016  PHQ - 2 Score 0 0 0 0 0 0 0  PHQ- 9 Score - - - - - - 0    Fall Risk Fall Risk  02/16/2021 02/01/2020 01/16/2020 06/19/2019 11/27/2018  Falls in the past year? 0 0 0 0 0  Number falls in past yr: - 0 0 - -  Injury with Fall? 0 0 - - -  Follow up - Falls evaluation completed Falls evaluation completed Falls evaluation completed -    FALL RISK PREVENTION PERTAINING TO THE HOME: Adequate lighting in your home to reduce risk of falls? Yes   ASSISTIVE DEVICES UTILIZED TO PREVENT FALLS: Life alert? No  Use of a cane, walker or w/c? No   TIMED UP AND GO: Was the test performed? No .   Cognitive Function: Patient is alert and oriented x3.  Enjoys word puzzles and other brain stimulating activities.   MMSE - Mini Mental State Exam 09/22/2017 09/21/2016  Orientation to time 5 5  Orientation to Place 5 5  Registration 3 3  Attention/ Calculation 5 5  Recall 3 3  Language- name 2 objects 2 2  Language- repeat 1 1  Language- follow 3 step command 3 3  Language- read & follow direction 1 1  Write a sentence 1 1  Copy design 1 1  Total score 30 30     6CIT Screen 11/27/2018  What Year? 0 points  What month? 0 points  What time? 0 points  Count back from 20 0 points  Months in reverse 0 points  Repeat phrase 0 points  Total Score 0    Immunizations Immunization History  Administered Date(s) Administered   Fluad Quad(high Dose 65+) 01/25/2019, 02/01/2020   Influenza, High Dose Seasonal PF 02/17/2016, 02/22/2017, 02/28/2018   PFIZER  Comirnaty(Gray Top)Covid-19 Tri-Sucrose Vaccine 09/11/2020   PFIZER(Purple Top)SARS-COV-2 Vaccination 06/05/2019,  06/30/2019, 03/05/2020   Pneumococcal Conjugate-13 11/24/2016   Pneumococcal Polysaccharide-23 06/01/2018   Shingles vaccine- Due, Education has been provided regarding the importance of this vaccine. Advised may receive this vaccine at local pharmacy or Health Dept. Aware to provide a copy of the vaccination record if obtained from local pharmacy or Health department. Deferred.   TDAP status: Due, Education has been provided regarding the importance of this vaccine. Advised may receive this vaccine at local pharmacy or Health Dept. Aware to provide a copy of the vaccination record if obtained from local pharmacy or Health Dept. Verbalized acceptance and understanding. Deferred.   Influenza vaccine- Nurse visit scheduled.   Health Maintenance Health Maintenance  Topic Date Due   COVID-19 Vaccine (5 - Booster for Pfizer series) 03/04/2021 (Originally 01/11/2021)   Zoster Vaccines- Shingrix (1 of 2) 03/25/2021 (Originally 05/21/1964)   DEXA SCAN  07/01/2021 (Originally 05/21/2008)   INFLUENZA VACCINE  08/14/2021 (Originally 12/15/2020)   TETANUS/TDAP  12/23/2021 (Originally [birth year]/21/1962)   Hepatitis C Screening  02/16/2022 (Originally 05/21/1963)   HPV VACCINES  Aged Out   Mammogram- completed 09/08/20.  Bone density- deferred per patient.   Lung Cancer Screening: (Low Dose CT Chest recommended if Age 34-80 years, 30 pack-year currently smoking OR have quit w/in 15years.) does not qualify.   Hepatitis C Screening: deferred.   Vision Screening: Recommended annual ophthalmology exams for early detection of glaucoma and other disorders of the eye.  Dental Screening: Recommended annual dental exams for proper oral hygiene  Community Resource Referral / Chronic Care Management: CRR required this visit?  No   CCM required this visit?  No      Plan:   Keep all routine maintenance  appointments.   I have personally reviewed and noted the following in the patient's chart:   Medical and social history Use of alcohol, tobacco or illicit drugs  Current medications and supplements including opioid prescriptions. Not taking opioid.  Functional ability and status Nutritional status Physical activity Advanced directives List of other physicians Hospitalizations, surgeries, and ER visits in previous 12 months Vitals Screenings to include cognitive, depression, and falls Referrals and appointments  In addition, I have reviewed and discussed with patient certain preventive protocols, quality metrics, and best practice recommendations. A written personalized care plan for preventive services as well as general preventive health recommendations were provided to patient via mychart.     Varney Biles, LPN   06/21/971

## 2021-02-16 NOTE — Patient Instructions (Addendum)
  Ms. Harlan , Thank you for taking time to come for your Medicare Wellness Visit. I appreciate your ongoing commitment to your health goals. Please review the following plan we discussed and let me know if I can assist you in the future.   These are the goals we discussed:  Goals      Increase physical activity     Standing/chair and stretch exercises 10 minutes daily.           This is a list of the screening recommended for you and due dates:  Health Maintenance  Topic Date Due   COVID-19 Vaccine (5 - Booster for Pfizer series) 03/04/2021*   Zoster (Shingles) Vaccine (1 of 2) 03/25/2021*   DEXA scan (bone density measurement)  07/01/2021*   Flu Shot  08/14/2021*   Tetanus Vaccine  12/23/2021*   Hepatitis C Screening: USPSTF Recommendation to screen - Ages 18-79 yo.  02/16/2022*   HPV Vaccine  Aged Out  *Topic was postponed. The date shown is not the original due date.

## 2021-02-19 DIAGNOSIS — X32XXXA Exposure to sunlight, initial encounter: Secondary | ICD-10-CM | POA: Diagnosis not present

## 2021-02-19 DIAGNOSIS — L821 Other seborrheic keratosis: Secondary | ICD-10-CM | POA: Diagnosis not present

## 2021-02-19 DIAGNOSIS — C44519 Basal cell carcinoma of skin of other part of trunk: Secondary | ICD-10-CM | POA: Diagnosis not present

## 2021-02-19 DIAGNOSIS — D2262 Melanocytic nevi of left upper limb, including shoulder: Secondary | ICD-10-CM | POA: Diagnosis not present

## 2021-02-19 DIAGNOSIS — D2272 Melanocytic nevi of left lower limb, including hip: Secondary | ICD-10-CM | POA: Diagnosis not present

## 2021-02-19 DIAGNOSIS — D2261 Melanocytic nevi of right upper limb, including shoulder: Secondary | ICD-10-CM | POA: Diagnosis not present

## 2021-02-19 DIAGNOSIS — D485 Neoplasm of uncertain behavior of skin: Secondary | ICD-10-CM | POA: Diagnosis not present

## 2021-02-19 DIAGNOSIS — Z85828 Personal history of other malignant neoplasm of skin: Secondary | ICD-10-CM | POA: Diagnosis not present

## 2021-02-19 DIAGNOSIS — L570 Actinic keratosis: Secondary | ICD-10-CM | POA: Diagnosis not present

## 2021-02-20 ENCOUNTER — Ambulatory Visit (INDEPENDENT_AMBULATORY_CARE_PROVIDER_SITE_OTHER): Payer: Medicare PPO

## 2021-02-20 ENCOUNTER — Other Ambulatory Visit: Payer: Self-pay

## 2021-02-20 DIAGNOSIS — Z23 Encounter for immunization: Secondary | ICD-10-CM | POA: Diagnosis not present

## 2021-03-01 ENCOUNTER — Other Ambulatory Visit: Payer: Self-pay | Admitting: Internal Medicine

## 2021-03-06 DIAGNOSIS — C44519 Basal cell carcinoma of skin of other part of trunk: Secondary | ICD-10-CM | POA: Diagnosis not present

## 2021-03-29 ENCOUNTER — Other Ambulatory Visit: Payer: Self-pay | Admitting: Internal Medicine

## 2021-04-14 ENCOUNTER — Encounter: Payer: Self-pay | Admitting: Adult Health

## 2021-04-14 ENCOUNTER — Other Ambulatory Visit: Payer: Medicare PPO

## 2021-04-14 ENCOUNTER — Ambulatory Visit (INDEPENDENT_AMBULATORY_CARE_PROVIDER_SITE_OTHER): Payer: Medicare PPO | Admitting: Adult Health

## 2021-04-14 ENCOUNTER — Other Ambulatory Visit: Payer: Self-pay

## 2021-04-14 VITALS — Ht 63.0 in | Wt 183.0 lb

## 2021-04-14 DIAGNOSIS — J069 Acute upper respiratory infection, unspecified: Secondary | ICD-10-CM

## 2021-04-14 LAB — POCT INFLUENZA A/B
Influenza A, POC: NEGATIVE
Influenza B, POC: NEGATIVE

## 2021-04-14 LAB — POC COVID19 BINAXNOW: SARS Coronavirus 2 Ag: NEGATIVE

## 2021-04-14 MED ORDER — HYDROCOD POLST-CPM POLST ER 10-8 MG/5ML PO SUER: 5.0000 mL | Freq: Every evening | ORAL | 0 refills | Status: DC | PRN

## 2021-04-14 NOTE — Patient Instructions (Signed)

## 2021-04-14 NOTE — Progress Notes (Signed)
scheduled

## 2021-04-14 NOTE — Progress Notes (Addendum)
Virtual Visit via Telephone Note  I connected with Lindsey Harmon on 04/14/21 at 10:00 AM EST by telephone and verified that I am speaking with the correct person using two identifiers.  Location: Patient: at home   Provider: Provider: Provider's office at  [facility], [place].      I discussed the limitations, risks, security and privacy concerns of performing an evaluation and management service by telephone and the availability of in person appointments. I also discussed with the patient that there may be a patient responsible charge related to this service. The patient expressed understanding and agreed to proceed.   History of Present Illness:   Patient has symptoms of sore throat, headaches, burning in chest. Deep cough non productive. Sinus drainage x 4 days. Onset 04/11/21.  Cough keeping awake at night.  She denies any edema.  Denies any shortness of breath. She is able to walk around home without any problems. No vital signs available.   Patient  denies any fever, body aches,chills, rash, chest pain, shortness of breath, nausea, vomiting, or diarrhea.   Observations/Objective:  Patient is alert and oriented and responsive to questions Engages in conversation with provider. Speaks in full sentences without any pauses without any shortness of breath or distress.    Assessment and Plan: Viral upper respiratory tract infection - Plan: POC COVID-19, COVID-19, Flu A+B and RSV, POCT Influenza A/B, CANCELED: COVID-19, Flu A+B and RSV  Patient will be scheduled for flu COVID and RSV swab today in the parking lot.  Meds ordered this encounter  Medications   chlorpheniramine-HYDROcodone (TUSSIONEX PENNKINETIC ER) 10-8 MG/5ML SUER    Sig: Take 5 mLs by mouth at bedtime as needed for cough.    Dispense:  115 mL    Refill:  0  Salt water gargles 3 times a day with warm water advised for sore throat.  Also okay to take Tylenol as needed when not taking  Tussionex at bedtime.  Discussed side effects of Tussionex Pennkinetic with patient. Viral versus bacterial infection discussed with patient patient will call back if any symptoms change worsen or persist at any time. Follow Up Instructions:   Advised in person evaluation at anytime is advised if any symptoms do not improve, worsen or change at any given time.  Red Flags discussed. The patient was given clear instructions to go to ER or return to medical center if any red flags develop, symptoms do not improve, worsen or new problems develop. They verbalized understanding.  I discussed the assessment and treatment plan with the patient. The patient was provided an opportunity to ask questions and all were answered. The patient agreed with the plan and demonstrated an understanding of the instructions.   The patient was advised to call back or seek an in-person evaluation if the symptoms worsen or if the condition fails to improve as anticipated.  I provided 20 minutes of non-face-to-face time during this encounter.   Marcille Buffy, FNP

## 2021-04-15 LAB — COVID-19, FLU A+B AND RSV
Influenza A, NAA: NOT DETECTED
Influenza B, NAA: NOT DETECTED
RSV, NAA: NOT DETECTED
SARS-CoV-2, NAA: NOT DETECTED

## 2021-04-15 NOTE — Progress Notes (Signed)
Negative for covid, flu and rsv - follow up to see how she is feeling?

## 2021-04-16 DIAGNOSIS — J209 Acute bronchitis, unspecified: Secondary | ICD-10-CM | POA: Diagnosis not present

## 2021-04-16 DIAGNOSIS — J0100 Acute maxillary sinusitis, unspecified: Secondary | ICD-10-CM | POA: Diagnosis not present

## 2021-04-16 DIAGNOSIS — H1031 Unspecified acute conjunctivitis, right eye: Secondary | ICD-10-CM | POA: Diagnosis not present

## 2021-04-16 DIAGNOSIS — R058 Other specified cough: Secondary | ICD-10-CM | POA: Diagnosis not present

## 2021-04-16 DIAGNOSIS — R059 Cough, unspecified: Secondary | ICD-10-CM | POA: Diagnosis not present

## 2021-04-16 DIAGNOSIS — R0789 Other chest pain: Secondary | ICD-10-CM | POA: Diagnosis not present

## 2021-04-16 DIAGNOSIS — Z03818 Encounter for observation for suspected exposure to other biological agents ruled out: Secondary | ICD-10-CM | POA: Diagnosis not present

## 2021-04-20 ENCOUNTER — Other Ambulatory Visit: Payer: Medicare PPO

## 2021-06-19 ENCOUNTER — Other Ambulatory Visit: Payer: Self-pay | Admitting: Internal Medicine

## 2021-06-21 ENCOUNTER — Other Ambulatory Visit: Payer: Self-pay | Admitting: Internal Medicine

## 2021-06-29 ENCOUNTER — Other Ambulatory Visit: Payer: Self-pay

## 2021-06-29 ENCOUNTER — Other Ambulatory Visit (INDEPENDENT_AMBULATORY_CARE_PROVIDER_SITE_OTHER): Payer: Medicare PPO

## 2021-06-29 DIAGNOSIS — R7989 Other specified abnormal findings of blood chemistry: Secondary | ICD-10-CM

## 2021-06-29 DIAGNOSIS — E7800 Pure hypercholesterolemia, unspecified: Secondary | ICD-10-CM

## 2021-06-29 DIAGNOSIS — R739 Hyperglycemia, unspecified: Secondary | ICD-10-CM | POA: Diagnosis not present

## 2021-06-29 DIAGNOSIS — I10 Essential (primary) hypertension: Secondary | ICD-10-CM | POA: Diagnosis not present

## 2021-06-29 LAB — CBC WITH DIFFERENTIAL/PLATELET
Basophils Absolute: 0 10*3/uL (ref 0.0–0.1)
Basophils Relative: 0.8 % (ref 0.0–3.0)
Eosinophils Absolute: 0.2 10*3/uL (ref 0.0–0.7)
Eosinophils Relative: 3.4 % (ref 0.0–5.0)
HCT: 39.3 % (ref 36.0–46.0)
Hemoglobin: 13.2 g/dL (ref 12.0–15.0)
Lymphocytes Relative: 25.1 % (ref 12.0–46.0)
Lymphs Abs: 1.3 10*3/uL (ref 0.7–4.0)
MCHC: 33.6 g/dL (ref 30.0–36.0)
MCV: 86.1 fl (ref 78.0–100.0)
Monocytes Absolute: 0.4 10*3/uL (ref 0.1–1.0)
Monocytes Relative: 7.3 % (ref 3.0–12.0)
Neutro Abs: 3.2 10*3/uL (ref 1.4–7.7)
Neutrophils Relative %: 63.4 % (ref 43.0–77.0)
Platelets: 180 10*3/uL (ref 150.0–400.0)
RBC: 4.57 Mil/uL (ref 3.87–5.11)
RDW: 14.8 % (ref 11.5–15.5)
WBC: 5 10*3/uL (ref 4.0–10.5)

## 2021-06-29 LAB — HEMOGLOBIN A1C: Hgb A1c MFr Bld: 6.2 % (ref 4.6–6.5)

## 2021-06-29 LAB — TSH: TSH: 3.79 u[IU]/mL (ref 0.35–5.50)

## 2021-06-30 LAB — BASIC METABOLIC PANEL
BUN: 23 mg/dL (ref 6–23)
CO2: 30 mEq/L (ref 19–32)
Calcium: 9.6 mg/dL (ref 8.4–10.5)
Chloride: 103 mEq/L (ref 96–112)
Creatinine, Ser: 0.98 mg/dL (ref 0.40–1.20)
GFR: 55.44 mL/min — ABNORMAL LOW (ref >60.00)
Glucose, Bld: 100 mg/dL — ABNORMAL HIGH (ref 70–99)
Potassium: 3.9 mEq/L (ref 3.5–5.1)
Sodium: 141 mEq/L (ref 135–145)

## 2021-06-30 LAB — LIPID PANEL
Cholesterol: 171 mg/dL (ref 0–200)
HDL: 42.9 mg/dL (ref >39.00)
LDL Cholesterol: 91 mg/dL (ref 0–99)
NonHDL: 127.96
Total CHOL/HDL Ratio: 4
Triglycerides: 186 mg/dL — ABNORMAL HIGH (ref 0.0–149.0)
VLDL: 37.2 mg/dL (ref 0.0–40.0)

## 2021-06-30 LAB — HEPATIC FUNCTION PANEL
ALT: 23 U/L (ref 0–35)
AST: 26 U/L (ref 0–37)
Albumin: 4.5 g/dL (ref 3.5–5.2)
Alkaline Phosphatase: 65 U/L (ref 39–117)
Bilirubin, Direct: 0.1 mg/dL (ref 0.0–0.3)
Total Bilirubin: 0.6 mg/dL (ref 0.2–1.2)
Total Protein: 6.7 g/dL (ref 6.0–8.3)

## 2021-07-01 ENCOUNTER — Other Ambulatory Visit: Payer: Self-pay

## 2021-07-01 ENCOUNTER — Ambulatory Visit (INDEPENDENT_AMBULATORY_CARE_PROVIDER_SITE_OTHER): Payer: Medicare PPO | Admitting: Internal Medicine

## 2021-07-01 VITALS — BP 130/74 | HR 67 | Temp 98.0°F | Resp 16 | Ht 64.0 in | Wt 175.0 lb

## 2021-07-01 DIAGNOSIS — Z87440 Personal history of urinary (tract) infections: Secondary | ICD-10-CM

## 2021-07-01 DIAGNOSIS — F429 Obsessive-compulsive disorder, unspecified: Secondary | ICD-10-CM

## 2021-07-01 DIAGNOSIS — E7800 Pure hypercholesterolemia, unspecified: Secondary | ICD-10-CM

## 2021-07-01 DIAGNOSIS — Z0000 Encounter for general adult medical examination without abnormal findings: Secondary | ICD-10-CM

## 2021-07-01 DIAGNOSIS — R739 Hyperglycemia, unspecified: Secondary | ICD-10-CM | POA: Diagnosis not present

## 2021-07-01 DIAGNOSIS — Z860100 Personal history of colon polyps, unspecified: Secondary | ICD-10-CM

## 2021-07-01 DIAGNOSIS — J3489 Other specified disorders of nose and nasal sinuses: Secondary | ICD-10-CM

## 2021-07-01 DIAGNOSIS — I10 Essential (primary) hypertension: Secondary | ICD-10-CM | POA: Diagnosis not present

## 2021-07-01 DIAGNOSIS — N183 Chronic kidney disease, stage 3 unspecified: Secondary | ICD-10-CM | POA: Diagnosis not present

## 2021-07-01 MED ORDER — ROSUVASTATIN CALCIUM 20 MG PO TABS: 20.0000 mg | ORAL_TABLET | Freq: Every day | ORAL | 1 refills | Status: DC

## 2021-07-01 NOTE — Patient Instructions (Addendum)
Nasacort nasal spray - 2 sprays each nostril one time per day.  Do this in the evening.    Stop simvastatin.   Start crestor 20mg  per day

## 2021-07-01 NOTE — Progress Notes (Signed)
Patient ID: Lindsey Harmon, female   DOB: 01-28-1946, 76 y.o.   MRN: [id number]   Subjective:    Patient ID: Lindsey Harmon, female    DOB: Sep 18, 1945, 76 y.o.   MRN: [id number]  This visit occurred during the SARS-CoV-2 public health emergency.  Safety protocols were in place, including screening questions prior to the visit, additional usage of staff PPE, and extensive cleaning of exam room while observing appropriate contact time as indicated for disinfecting solutions.   Patient here for her physical exam.   Chief Complaint  Patient presents with   Annual Exam   .   HPI Reports she is doing relatively well.  Has noticed some drainage/tickling in her throat.  No sinus pressure.  No fever.  No chest congestion or increased cough.  No sob.  No chest pain.  No abdominal pain or bowel change reported.  Does feel zoloft is helping. Does not feel needs any further intervention.    Past Medical History:  Diagnosis Date   Abnormal liver function    Cystocele, unspecified (CODE)    Fibrocystic breast disease    GERD (gastroesophageal reflux disease)    Hypertension    Intrinsic sphincter deficiency    Pelvic relaxation    Pure hypercholesterolemia    Rectocele    SUI (stress urinary incontinence, female)    Urinary retention    Urinary retention    Past Surgical History:  Procedure Laterality Date   ABDOMINAL HYSTERECTOMY     APPENDECTOMY     Bladder Tack     burch urethropexy  10/24/2000   laparoscopic colpopexy/culdoplasty   COLONOSCOPY WITH PROPOFOL N/A 08/10/2016   Procedure: COLONOSCOPY WITH PROPOFOL;  Surgeon: Lollie Sails, MD;  Location: [facility] ENDOSCOPY;  Service: Endoscopy;  Laterality: N/A;   COLONOSCOPY WITH PROPOFOL N/A 10/20/2016   Procedure: COLONOSCOPY WITH PROPOFOL;  Surgeon: Robert Bellow, MD;  Location: [facility] ENDOSCOPY;  Service: Endoscopy;  Laterality: N/A;   COLONOSCOPY WITH PROPOFOL N/A 06/01/2017   Procedure: COLONOSCOPY WITH PROPOFOL;  Surgeon: Robert Bellow, MD;  Location: [facility] ENDOSCOPY;  Service: Endoscopy;  Laterality: N/A;   COLPORRHAPHY     forv repair cystocele anterior   TRANSVAGINAL TAPE (TVT) REMOVAL     Family History  Problem Relation Age of Onset   Pneumonia Mother        died of presumed aspiration pneumonia   Hypertension Mother    Cancer Father        prostate   Heart disease Paternal Grandfather        myocardial infarction   Alzheimer's disease Brother    Social History   Socioeconomic History   Marital status: Married    Spouse name: Not on file   Number of children: Not on file   Years of education: Not on file   Highest education level: Not on file  Occupational History   Not on file  Tobacco Use   Smoking status: Never   Smokeless tobacco: Never  Vaping Use   Vaping Use: Never used  Substance and Sexual Activity   Alcohol use: No    Alcohol/week: 0.0 standard drinks   Drug use: No   Sexual activity: Not on file  Other Topics Concern   Not on file  Social History Narrative   Not on file   Social Determinants of Health   Financial Resource Strain: Low Risk    Difficulty of Paying Living Expenses: Not hard at all  Food Insecurity: No Food  Insecurity   Worried About Charity fundraiser in the Last Year: Never true   Animas in the Last Year: Never true  Transportation Needs: No Transportation Needs   Lack of Transportation (Medical): No   Lack of Transportation (Non-Medical): No  Physical Activity: Insufficiently Active   Days of Exercise per Week: 7 days   Minutes of Exercise per Session: 10 min  Stress: No Stress Concern Present   Feeling of Stress : Not at all  Social Connections: Socially Integrated   Frequency of Communication with Friends and Family: More than three times a week   Frequency of Social Gatherings with Friends and Family: More than three times a week   Attends Religious Services: More than 4 times per year   Active Member of [employer] or Organizations: Yes    Attends Archivist Meetings: Not on file   Marital Status: Married     Review of Systems  Constitutional:  Negative for appetite change and unexpected weight change.  HENT:  Negative for sinus pressure and sore throat.        Increased drainage as outlined.   Eyes:  Negative for pain and visual disturbance.  Respiratory:  Negative for cough, chest tightness and shortness of breath.   Cardiovascular:  Negative for chest pain, palpitations and leg swelling.  Gastrointestinal:  Negative for abdominal pain, diarrhea, nausea and vomiting.  Genitourinary:  Negative for difficulty urinating and dysuria.  Musculoskeletal:  Negative for joint swelling and myalgias.  Skin:  Negative for color change and rash.  Neurological:  Negative for dizziness, light-headedness and headaches.  Hematological:  Negative for adenopathy. Does not bruise/bleed easily.  Psychiatric/Behavioral:  Negative for agitation and dysphoric mood.       Objective:     BP 130/74    Pulse 67    Temp 98 F (36.7 C)    Resp 16    Ht _0  (1.626 m)    Wt 175 lb (79.4 kg)    SpO2 99%    BMI 30.04 kg/m  Wt Readings from Last 3 Encounters:  07/01/21 175 lb (79.4 kg)  04/14/21 183 lb (83 kg)  02/16/21 183 lb (83 kg)    Physical Exam Vitals reviewed.  Constitutional:      General: She is not in acute distress.    Appearance: Normal appearance. She is well-developed.  HENT:     Head: Normocephalic and atraumatic.     Right Ear: External ear normal.     Left Ear: External ear normal.  Eyes:     General: No scleral icterus.       Right eye: No discharge.        Left eye: No discharge.     Conjunctiva/sclera: Conjunctivae normal.  Neck:     Thyroid: No thyromegaly.  Cardiovascular:     Rate and Rhythm: Normal rate and regular rhythm.  Pulmonary:     Effort: No tachypnea, accessory muscle usage or respiratory distress.     Breath sounds: Normal breath sounds. No decreased breath sounds or wheezing.  Chest:   Breasts:    Right: No inverted nipple, mass, nipple discharge or tenderness (no axillary adenopathy).     Left: No inverted nipple, mass, nipple discharge or tenderness (no axilarry adenopathy).  Abdominal:     General: Bowel sounds are normal.     Palpations: Abdomen is soft.     Tenderness: There is no abdominal tenderness.  Musculoskeletal:  General: No swelling or tenderness.     Cervical back: Neck supple.  Lymphadenopathy:     Cervical: No cervical adenopathy.  Skin:    Findings: No erythema or rash.  Neurological:     Mental Status: She is alert and oriented to person, place, and time.  Psychiatric:        Mood and Affect: Mood normal.        Behavior: Behavior normal.     Outpatient Encounter Medications as of 07/01/2021  Medication Sig   rosuvastatin (CRESTOR) 20 MG tablet Take 1 tablet (20 mg total) by mouth daily.   bisoprolol (ZEBETA) 5 MG tablet TAKE 1 TABLET BY MOUTH EVERY DAY   Calcium Carbonate-Vitamin D (CALTRATE 600+D PO) Take by mouth.   cephALEXin (KEFLEX) 250 MG capsule TAKE 1 CAPSULE BY MOUTH EVERY DAY   hydrochlorothiazide (MICROZIDE) 12.5 MG capsule TAKE 1 CAPSULE BY MOUTH EVERY DAY   olmesartan (BENICAR) 40 MG tablet TAKE 1 TABLET BY MOUTH EVERY DAY   Omega-3 Fatty Acids (FISH OIL) 1200 MG CAPS Take by mouth daily.   omeprazole (PRILOSEC) 20 MG capsule TAKE 1 CAPSULE BY MOUTH TWICE A DAY   sertraline (ZOLOFT) 50 MG tablet TAKE 1 AND 1/2 TABLETS BY MOUTH DAILY   [DISCONTINUED] chlorpheniramine-HYDROcodone (TUSSIONEX PENNKINETIC ER) 10-8 MG/5ML SUER Take 5 mLs by mouth at bedtime as needed for cough.   [DISCONTINUED] Clobetasol Prop Emollient Base 0.05 % emollient cream Apply topically 2 (two) times daily.   [DISCONTINUED] simvastatin (ZOCOR) 20 MG tablet TAKE 1 TABLET BY MOUTH EVERY DAY   No facility-administered encounter medications on file as of 07/01/2021.     Lab Results  Component Value Date   WBC 5.0 06/29/2021   HGB 13.2 06/29/2021    HCT 39.3 06/29/2021   PLT 180.0 06/29/2021   GLUCOSE 100 (H) 06/29/2021   CHOL 171 06/29/2021   TRIG 186.0 (H) 06/29/2021   HDL 42.90 06/29/2021   LDLDIRECT 83.0 05/30/2018   LDLCALC 91 06/29/2021   ALT 23 06/29/2021   AST 26 06/29/2021   NA 141 06/29/2021   K 3.9 06/29/2021   CL 103 06/29/2021   CREATININE 0.98 06/29/2021   BUN 23 06/29/2021   CO2 30 06/29/2021   TSH 3.79 06/29/2021   HGBA1C 6.2 06/29/2021    MM 3D SCREEN BREAST BILATERAL  Result Date: 09/08/2020 CLINICAL DATA:  Screening. EXAM: DIGITAL SCREENING BILATERAL MAMMOGRAM WITH TOMOSYNTHESIS AND CAD TECHNIQUE: Bilateral screening digital craniocaudal and mediolateral oblique mammograms were obtained. Bilateral screening digital breast tomosynthesis was performed. The images were evaluated with computer-aided detection. COMPARISON:  Previous exam(s). ACR Breast Density Category b: There are scattered areas of fibroglandular density. FINDINGS: There are no findings suspicious for malignancy. The images were evaluated with computer-aided detection. IMPRESSION: No mammographic evidence of malignancy. A result letter of this screening mammogram will be mailed directly to the patient. RECOMMENDATION: Screening mammogram in one year. (Code:SM-B-01Y) BI-RADS CATEGORY  1: Negative. Electronically Signed   By: Lajean Manes M.D.   On: 09/08/2020 12:34       Assessment & Plan:   Problem List Items Addressed This Visit     CKD (chronic kidney disease) stage 3, GFR 30-59 ml/min (HCC)    Avoid antiinflammatories.  Stay hydrated.  Follow metabolic panel. Recent GFR 55.       Essential hypertension, benign    Blood pressure doing well.  On benicar.  Follow pressures.  Follow metabolic panel.       Relevant Medications  rosuvastatin (CRESTOR) 20 MG tablet   Health care maintenance    Physical today 07/01/21.  Mammogram 09/08/20 - Birads I.  Agreeable for colonoscopy.  Wants referral to Dr Bary Castilla.        History of colon polyps     Colonoscopy 05/2017 - two polyps (tubular adenoma and hyperplastic).  Per report - due f/u.  Wants to see Dr Bary Castilla.       History of frequent urinary tract infections    Continues daily keflex.  Self caths.  Stable.  Follow.       Hypercholesterolemia    Discussed recent labs.  Currently on simvastatin 54m q day.  Change to crestor 267mq day.  Low cholesterol diet and exercise.  Follow lipid panel and liver function tests.       Relevant Medications   rosuvastatin (CRESTOR) 20 MG tablet   Other Relevant Orders   Hepatic function panel   Hyperglycemia    Low carb diet and exercise.  Follow met b and a1c.       Nasal drainage    Nasacort nasal spray as directed.  Follow.  Call with update.       Obsessive compulsive disorder    Continue zoloft.  Stable.       Other Visit Diagnoses     Routine general medical examination at a health care facility    -  Primary        ChEinar PheasantMD

## 2021-07-01 NOTE — Assessment & Plan Note (Signed)
Physical today 07/01/21.  Mammogram 09/08/20 - Birads I.  Agreeable for colonoscopy.  Wants referral to Dr Bary Castilla.

## 2021-07-02 DIAGNOSIS — H35371 Puckering of macula, right eye: Secondary | ICD-10-CM | POA: Diagnosis not present

## 2021-07-02 DIAGNOSIS — D3132 Benign neoplasm of left choroid: Secondary | ICD-10-CM | POA: Diagnosis not present

## 2021-07-02 DIAGNOSIS — H2513 Age-related nuclear cataract, bilateral: Secondary | ICD-10-CM | POA: Diagnosis not present

## 2021-07-05 ENCOUNTER — Encounter: Payer: Self-pay | Admitting: Internal Medicine

## 2021-07-05 DIAGNOSIS — J3489 Other specified disorders of nose and nasal sinuses: Secondary | ICD-10-CM | POA: Insufficient documentation

## 2021-07-05 NOTE — Assessment & Plan Note (Signed)
Low carb diet and exercise.  Follow met b and a1c.

## 2021-07-05 NOTE — Assessment & Plan Note (Signed)
Continue zoloft.  Stable.  

## 2021-07-05 NOTE — Assessment & Plan Note (Signed)
Discussed recent labs.  Currently on simvastatin 20mg  q day.  Change to crestor 20mg  q day.  Low cholesterol diet and exercise.  Follow lipid panel and liver function tests.

## 2021-07-05 NOTE — Assessment & Plan Note (Signed)
Avoid antiinflammatories.  Stay hydrated.  Follow metabolic panel. Recent GFR 55.

## 2021-07-05 NOTE — Assessment & Plan Note (Signed)
Blood pressure doing well.  On benicar.  Follow pressures.  Follow metabolic panel.  

## 2021-07-05 NOTE — Assessment & Plan Note (Signed)
Colonoscopy 05/2017 - two polyps (tubular adenoma and hyperplastic).  Per report - due f/u.  Wants to see Dr Bary Castilla.

## 2021-07-05 NOTE — Assessment & Plan Note (Signed)
Continues daily keflex.  Self caths.  Stable.  Follow.  

## 2021-07-05 NOTE — Assessment & Plan Note (Signed)
Nasacort nasal spray as directed.  Follow.  Call with update.

## 2021-07-12 ENCOUNTER — Other Ambulatory Visit: Payer: Self-pay | Admitting: Internal Medicine

## 2021-07-25 ENCOUNTER — Other Ambulatory Visit: Payer: Self-pay | Admitting: Internal Medicine

## 2021-08-03 ENCOUNTER — Other Ambulatory Visit: Payer: Self-pay | Admitting: Internal Medicine

## 2021-08-12 ENCOUNTER — Other Ambulatory Visit (INDEPENDENT_AMBULATORY_CARE_PROVIDER_SITE_OTHER): Payer: Medicare PPO

## 2021-08-12 ENCOUNTER — Other Ambulatory Visit: Payer: Self-pay

## 2021-08-12 DIAGNOSIS — E7800 Pure hypercholesterolemia, unspecified: Secondary | ICD-10-CM

## 2021-08-12 LAB — HEPATIC FUNCTION PANEL
ALT: 22 U/L (ref 0–35)
AST: 27 U/L (ref 0–37)
Albumin: 4.4 g/dL (ref 3.5–5.2)
Alkaline Phosphatase: 61 U/L (ref 39–117)
Bilirubin, Direct: 0.1 mg/dL (ref 0.0–0.3)
Total Bilirubin: 0.9 mg/dL (ref 0.2–1.2)
Total Protein: 6.4 g/dL (ref 6.0–8.3)

## 2021-09-11 DIAGNOSIS — Z85828 Personal history of other malignant neoplasm of skin: Secondary | ICD-10-CM | POA: Diagnosis not present

## 2021-09-11 DIAGNOSIS — L570 Actinic keratosis: Secondary | ICD-10-CM | POA: Diagnosis not present

## 2021-09-11 DIAGNOSIS — D225 Melanocytic nevi of trunk: Secondary | ICD-10-CM | POA: Diagnosis not present

## 2021-09-11 DIAGNOSIS — D2262 Melanocytic nevi of left upper limb, including shoulder: Secondary | ICD-10-CM | POA: Diagnosis not present

## 2021-09-11 DIAGNOSIS — D2261 Melanocytic nevi of right upper limb, including shoulder: Secondary | ICD-10-CM | POA: Diagnosis not present

## 2021-10-08 ENCOUNTER — Other Ambulatory Visit: Payer: Self-pay | Admitting: Internal Medicine

## 2021-10-08 DIAGNOSIS — Z1231 Encounter for screening mammogram for malignant neoplasm of breast: Secondary | ICD-10-CM

## 2021-10-23 ENCOUNTER — Ambulatory Visit
Admission: RE | Admit: 2021-10-23 | Discharge: 2021-10-23 | Disposition: A | Discharge status: Home / Self Care | Payer: Medicare PPO | Source: Ambulatory Visit | Attending: Internal Medicine | Admitting: Internal Medicine

## 2021-10-23 DIAGNOSIS — Z1231 Encounter for screening mammogram for malignant neoplasm of breast: Secondary | ICD-10-CM | POA: Insufficient documentation

## 2021-10-30 ENCOUNTER — Encounter: Payer: Self-pay | Admitting: Internal Medicine

## 2021-10-30 ENCOUNTER — Ambulatory Visit (INDEPENDENT_AMBULATORY_CARE_PROVIDER_SITE_OTHER): Payer: Medicare PPO | Admitting: Internal Medicine

## 2021-10-30 VITALS — BP 120/74 | HR 60 | Temp 98.0°F | Resp 15 | Ht 64.0 in | Wt 175.0 lb

## 2021-10-30 DIAGNOSIS — E2839 Other primary ovarian failure: Secondary | ICD-10-CM | POA: Diagnosis not present

## 2021-10-30 DIAGNOSIS — Z87440 Personal history of urinary (tract) infections: Secondary | ICD-10-CM

## 2021-10-30 DIAGNOSIS — N183 Chronic kidney disease, stage 3 unspecified: Secondary | ICD-10-CM

## 2021-10-30 DIAGNOSIS — Z860100 Personal history of colon polyps, unspecified: Secondary | ICD-10-CM

## 2021-10-30 DIAGNOSIS — F429 Obsessive-compulsive disorder, unspecified: Secondary | ICD-10-CM

## 2021-10-30 DIAGNOSIS — R739 Hyperglycemia, unspecified: Secondary | ICD-10-CM

## 2021-10-30 DIAGNOSIS — Z1211 Encounter for screening for malignant neoplasm of colon: Secondary | ICD-10-CM

## 2021-10-30 DIAGNOSIS — M25511 Pain in right shoulder: Secondary | ICD-10-CM

## 2021-10-30 DIAGNOSIS — E7800 Pure hypercholesterolemia, unspecified: Secondary | ICD-10-CM

## 2021-10-30 DIAGNOSIS — I10 Essential (primary) hypertension: Secondary | ICD-10-CM

## 2021-10-30 DIAGNOSIS — R945 Abnormal results of liver function studies: Secondary | ICD-10-CM | POA: Diagnosis not present

## 2021-10-30 LAB — LIPID PANEL
Cholesterol: 127 mg/dL (ref 0–200)
HDL: 46.7 mg/dL (ref >39.00)
LDL Cholesterol: 53 mg/dL (ref 0–99)
NonHDL: 79.91
Total CHOL/HDL Ratio: 3
Triglycerides: 133 mg/dL (ref 0.0–149.0)
VLDL: 26.6 mg/dL (ref 0.0–40.0)

## 2021-10-30 LAB — BASIC METABOLIC PANEL
BUN: 22 mg/dL (ref 6–23)
CO2: 30 mEq/L (ref 19–32)
Calcium: 9.9 mg/dL (ref 8.4–10.5)
Chloride: 102 mEq/L (ref 96–112)
Creatinine, Ser: 1.03 mg/dL (ref 0.40–1.20)
GFR: 52.1 mL/min — ABNORMAL LOW (ref >60.00)
Glucose, Bld: 93 mg/dL (ref 70–99)
Potassium: 3.9 mEq/L (ref 3.5–5.1)
Sodium: 141 mEq/L (ref 135–145)

## 2021-10-30 LAB — HEPATIC FUNCTION PANEL
ALT: 23 U/L (ref 0–35)
AST: 26 U/L (ref 0–37)
Albumin: 4.4 g/dL (ref 3.5–5.2)
Alkaline Phosphatase: 59 U/L (ref 39–117)
Bilirubin, Direct: 0.2 mg/dL (ref 0.0–0.3)
Total Bilirubin: 0.9 mg/dL (ref 0.2–1.2)
Total Protein: 6.8 g/dL (ref 6.0–8.3)

## 2021-10-30 LAB — HEMOGLOBIN A1C: Hgb A1c MFr Bld: 6.2 % (ref 4.6–6.5)

## 2021-10-30 MED ORDER — ROSUVASTATIN CALCIUM 20 MG PO TABS: 20.0000 mg | ORAL_TABLET | Freq: Every day | ORAL | 2 refills | Status: DC

## 2021-10-30 MED ORDER — CEPHALEXIN 250 MG PO CAPS: ORAL_CAPSULE | ORAL | 2 refills | Status: DC

## 2021-10-30 MED ORDER — SERTRALINE HCL 50 MG PO TABS: 75.0000 mg | ORAL_TABLET | Freq: Every day | ORAL | 3 refills | Status: DC

## 2021-10-30 MED ORDER — BISOPROLOL FUMARATE 5 MG PO TABS: 5.0000 mg | ORAL_TABLET | Freq: Every day | ORAL | 2 refills | Status: DC

## 2021-10-30 NOTE — Progress Notes (Unsigned)
Patient ID: Lindsey Harmon, female   DOB: 18-Sep-1945, 76 y.o.   MRN: [id number]   Subjective:    Patient ID: Lindsey Harmon, female    DOB: [birth year], 76 y.o.   MRN: [id number]   Patient here for a scheduled follow up.   Chief Complaint  Patient presents with   Hyperlipidemia   .   HPI Reports doing relatively well.  Keeping her grandson.  This is going well.  Stays active.  No chest pain or sob reported.  No abdominal pain or bowel change reported.  Has noticed over the past 3-4 weeks, some right shoulder - posterior shoulder discomfort.  Notices when in bed and rolls over - arm goes across her body.  Not a significant issue for her.  Does not really notice during the day.  She started keeping her grandson approximately 6 weeks ago.  Picks him up off the floor.  Holds him to feed - discussed positioning to do these task could be aggravating.  Discussed stretches.  Desires no further intervention.  Discussed colonoscopy.  Desires Dr Bary Castilla to perform.  Blood pressure doing well.     Past Medical History:  Diagnosis Date   Abnormal liver function    Cystocele, unspecified (CODE)    Fibrocystic breast disease    GERD (gastroesophageal reflux disease)    Hypertension    Intrinsic sphincter deficiency    Pelvic relaxation    Pure hypercholesterolemia    Rectocele    SUI (stress urinary incontinence, female)    Urinary retention    Urinary retention    Past Surgical History:  Procedure Laterality Date   ABDOMINAL HYSTERECTOMY     APPENDECTOMY     Bladder Tack     burch urethropexy  10/24/2000   laparoscopic colpopexy/culdoplasty   COLONOSCOPY WITH PROPOFOL N/A 08/10/2016   Procedure: COLONOSCOPY WITH PROPOFOL;  Surgeon: Lollie Sails, MD;  Location: [facility] ENDOSCOPY;  Service: Endoscopy;  Laterality: N/A;   COLONOSCOPY WITH PROPOFOL N/A 10/20/2016   Procedure: COLONOSCOPY WITH PROPOFOL;  Surgeon: Robert Bellow, MD;  Location: [facility] ENDOSCOPY;  Service: Endoscopy;  Laterality:  N/A;   COLONOSCOPY WITH PROPOFOL N/A 06/01/2017   Procedure: COLONOSCOPY WITH PROPOFOL;  Surgeon: Robert Bellow, MD;  Location: [facility] ENDOSCOPY;  Service: Endoscopy;  Laterality: N/A;   COLPORRHAPHY     forv repair cystocele anterior   TRANSVAGINAL TAPE (TVT) REMOVAL     Family History  Problem Relation Age of Onset   Pneumonia Mother        died of presumed aspiration pneumonia   Hypertension Mother    Cancer Father        prostate   Heart disease Paternal Grandfather        myocardial infarction   Alzheimer's disease Brother    Social History   Socioeconomic History   Marital status: Married    Spouse name: Not on file   Number of children: Not on file   Years of education: Not on file   Highest education level: Not on file  Occupational History   Not on file  Tobacco Use   Smoking status: Never   Smokeless tobacco: Never  Vaping Use   Vaping Use: Never used  Substance and Sexual Activity   Alcohol use: No    Alcohol/week: 0.0 standard drinks of alcohol   Drug use: No   Sexual activity: Not on file  Other Topics Concern   Not on file  Social History Narrative   Not  on file   Social Determinants of Health   Financial Resource Strain: Low Risk  (02/16/2021)   Overall Financial Resource Strain (CARDIA)    Difficulty of Paying Living Expenses: Not hard at all  Food Insecurity: No Food Insecurity (02/16/2021)   Hunger Vital Sign    Worried About Running Out of Food in the Last Year: Never true    Ran Out of Food in the Last Year: Never true  Transportation Needs: No Transportation Needs (02/16/2021)   PRAPARE - Hydrologist (Medical): No    Lack of Transportation (Non-Medical): No  Physical Activity: Insufficiently Active (02/16/2021)   Exercise Vital Sign    Days of Exercise per Week: 7 days    Minutes of Exercise per Session: 10 min  Stress: No Stress Concern Present (02/16/2021)   [place]    Feeling of Stress : Not at all  Social Connections: [place] (02/16/2021)   Social Connection and Isolation Panel [NHANES]    Frequency of Communication with Friends and Family: More than three times a week    Frequency of Social Gatherings with Friends and Family: More than three times a week    Attends Religious Services: More than 4 times per year    Active Member of [employer] or Organizations: Yes    Attends Archivist Meetings: Not on file    Marital Status: Married     Review of Systems  Constitutional:  Negative for appetite change and unexpected weight change.  HENT:  Negative for congestion and sinus pressure.   Respiratory:  Negative for cough, chest tightness and shortness of breath.   Cardiovascular:  Negative for chest pain, palpitations and leg swelling.  Gastrointestinal:  Negative for abdominal pain, diarrhea, nausea and vomiting.  Genitourinary:  Negative for difficulty urinating and dysuria.  Musculoskeletal:  Negative for joint swelling and myalgias.       Right posterior shoulder pain as outlined.   Skin:  Negative for color change and rash.  Neurological:  Negative for dizziness, light-headedness and headaches.  Psychiatric/Behavioral:  Negative for agitation and dysphoric mood.        Objective:     BP 120/74 (BP Location: Left Arm, Patient Position: Sitting, Cuff Size: Small)   Pulse 60   Temp 98 F (36.7 C) (Temporal)   Resp 15   Ht 5' 4" (1.626 m)   Wt 175 lb (79.4 kg)   SpO2 97%   BMI 30.04 kg/m  Wt Readings from Last 3 Encounters:  10/30/21 175 lb (79.4 kg)  07/01/21 175 lb (79.4 kg)  04/14/21 183 lb (83 kg)    Physical Exam Vitals reviewed.  Constitutional:      General: She is not in acute distress.    Appearance: Normal appearance.  HENT:     Head: Normocephalic and atraumatic.     Right Ear: External ear normal.     Left Ear: External ear normal.  Eyes:     General: No scleral  icterus.       Right eye: No discharge.        Left eye: No discharge.     Conjunctiva/sclera: Conjunctivae normal.  Neck:     Thyroid: No thyromegaly.  Cardiovascular:     Rate and Rhythm: Normal rate and regular rhythm.  Pulmonary:     Effort: No respiratory distress.     Breath sounds: Normal breath sounds. No wheezing.  Abdominal:  General: Bowel sounds are normal.     Palpations: Abdomen is soft.     Tenderness: There is no abdominal tenderness.  Musculoskeletal:        General: No swelling or tenderness.     Cervical back: Neck supple. No tenderness.     Comments: Good rom.  No significant pain with rotation - abduction/adduction of arm.   Lymphadenopathy:     Cervical: No cervical adenopathy.  Skin:    Findings: No erythema or rash.  Neurological:     Mental Status: She is alert.  Psychiatric:        Mood and Affect: Mood normal.        Behavior: Behavior normal.      Outpatient Encounter Medications as of 10/30/2021  Medication Sig   Calcium Carbonate-Vitamin D (CALTRATE 600+D PO) Take by mouth.   hydrochlorothiazide (MICROZIDE) 12.5 MG capsule TAKE 1 CAPSULE BY MOUTH EVERY DAY   olmesartan (BENICAR) 40 MG tablet TAKE 1 TABLET BY MOUTH EVERY DAY   Omega-3 Fatty Acids (FISH OIL) 1200 MG CAPS Take by mouth daily.   omeprazole (PRILOSEC) 20 MG capsule TAKE 1 CAPSULE BY MOUTH TWICE A DAY   [DISCONTINUED] bisoprolol (ZEBETA) 5 MG tablet TAKE 1 TABLET BY MOUTH EVERY DAY   [DISCONTINUED] cephALEXin (KEFLEX) 250 MG capsule TAKE 1 CAPSULE BY MOUTH EVERY DAY   [DISCONTINUED] rosuvastatin (CRESTOR) 20 MG tablet Take 1 tablet (20 mg total) by mouth daily.   [DISCONTINUED] sertraline (ZOLOFT) 50 MG tablet TAKE 1 AND 1/2 TABLETS BY MOUTH DAILY   bisoprolol (ZEBETA) 5 MG tablet Take 1 tablet (5 mg total) by mouth daily.   cephALEXin (KEFLEX) 250 MG capsule TAKE 1 CAPSULE BY MOUTH EVERY DAY   rosuvastatin (CRESTOR) 20 MG tablet Take 1 tablet (20 mg total) by mouth daily.    sertraline (ZOLOFT) 50 MG tablet Take 1.5 tablets (75 mg total) by mouth daily.   No facility-administered encounter medications on file as of 10/30/2021.     Lab Results  Component Value Date   WBC 5.0 06/29/2021   HGB 13.2 06/29/2021   HCT 39.3 06/29/2021   PLT 180.0 06/29/2021   GLUCOSE 93 10/30/2021   CHOL 127 10/30/2021   TRIG 133.0 10/30/2021   HDL 46.70 10/30/2021   LDLDIRECT 83.0 05/30/2018   LDLCALC 53 10/30/2021   ALT 23 10/30/2021   AST 26 10/30/2021   NA 141 10/30/2021   K 3.9 10/30/2021   CL 102 10/30/2021   CREATININE 1.03 10/30/2021   BUN 22 10/30/2021   CO2 30 10/30/2021   TSH 3.79 06/29/2021   HGBA1C 6.2 10/30/2021    MM 3D SCREEN BREAST BILATERAL  Result Date: 10/26/2021 CLINICAL DATA:  Screening. EXAM: DIGITAL SCREENING BILATERAL MAMMOGRAM WITH TOMOSYNTHESIS AND CAD TECHNIQUE: Bilateral screening digital craniocaudal and mediolateral oblique mammograms were obtained. Bilateral screening digital breast tomosynthesis was performed. The images were evaluated with computer-aided detection. COMPARISON:  Previous exam(s). ACR Breast Density Category b: There are scattered areas of fibroglandular density. FINDINGS: There are no findings suspicious for malignancy. IMPRESSION: No mammographic evidence of malignancy. A result letter of this screening mammogram will be mailed directly to the patient. RECOMMENDATION: Screening mammogram in one year. (Code:SM-B-01Y) BI-RADS CATEGORY  1: Negative. Electronically Signed   By: Dorise Bullion III M.D.   On: 10/26/2021 12:12       Assessment & Plan:   Problem List Items Addressed This Visit     Abnormal liver function    Recent liver function  tests are wnl.       CKD (chronic kidney disease) stage 3, GFR 30-59 ml/min (HCC)    Avoid antiinflammatories.  Stay hydrated.  Follow metabolic panel.  Last GFR 55. Continue benicar.       Colon cancer screening    Colonoscopy 2019 - Two 5 to 7 mm polyps in the mid descending  colon and in the distal transverse colon. Biopsied. The distal rectum and anal verge are normal on retroflexion view.  Repeat colonoscopy in 3 years for surveillance.  Discussed.  Requested referral to Dr Bary Castilla for colonoscopy.        Relevant Orders   Ambulatory referral to General Surgery   Essential hypertension, benign    Blood pressure doing well.  On benicar.  Follow pressures.  Follow metabolic panel.       Relevant Medications   bisoprolol (ZEBETA) 5 MG tablet   rosuvastatin (CRESTOR) 20 MG tablet   Other Relevant Orders   Basic Metabolic Panel (BMET) (Completed)   History of colon polyps    Colonoscopy 05/2017 - two polyps (tubular adenoma and hyperplastic).  Per report - due f/u.  Wants to see Dr Bary Castilla.       Relevant Orders   Ambulatory referral to General Surgery   History of frequent urinary tract infections    Continues daily keflex.  Self caths.  Stable.  Follow.       Hypercholesterolemia - Primary    On crestor 58m q day.  Low cholesterol diet and exercise.  Follow lipid panel and liver function tests.        Relevant Medications   bisoprolol (ZEBETA) 5 MG tablet   rosuvastatin (CRESTOR) 20 MG tablet   Other Relevant Orders   Lipid Profile (Completed)   Hepatic function panel (Completed)   Hyperglycemia    Low carb diet and exercise.  Follow met b and a1c.       Relevant Orders   HgB A1c (Completed)   Obsessive compulsive disorder    Continue zoloft.  Stable.       Relevant Medications   sertraline (ZOLOFT) 50 MG tablet   Right shoulder pain    Has noticed over the past 3-4 weeks, some right shoulder - posterior shoulder discomfort.  Notices when in bed and rolls over - arm goes across her body.  Not a significant issue for her.  Does not really notice during the day.  She started keeping her grandson approximately 6 weeks ago.  Picks him up off the floor.  Holds him to feed - discussed positioning to do these task could be aggravating.  Discussed  stretches.  Desires no further intervention.       Other Visit Diagnoses     Estrogen deficiency       Relevant Orders   DG Bone Density        CEinar Pheasant MD

## 2021-11-01 ENCOUNTER — Encounter: Payer: Self-pay | Admitting: Internal Medicine

## 2021-11-01 DIAGNOSIS — M25511 Pain in right shoulder: Secondary | ICD-10-CM | POA: Insufficient documentation

## 2021-11-01 NOTE — Assessment & Plan Note (Signed)
On crestor 20mg q day.  Low cholesterol diet and exercise.  Follow lipid panel and liver function tests.   

## 2021-11-01 NOTE — Assessment & Plan Note (Signed)
Colonoscopy 2019 - Two 5 to 7 mm polyps in the mid descending colon and in the distal transverse colon. Biopsied. The distal rectum and anal verge are normal on retroflexion view.  Repeat colonoscopy in 3 years for surveillance.  Discussed.  Requested referral to Dr Bary Castilla for colonoscopy.

## 2021-11-01 NOTE — Assessment & Plan Note (Signed)
Blood pressure doing well.  On benicar.  Follow pressures.  Follow metabolic panel.  

## 2021-11-01 NOTE — Assessment & Plan Note (Signed)
Continue zoloft.  Stable.  

## 2021-11-01 NOTE — Assessment & Plan Note (Signed)
Continues daily keflex.  Self caths.  Stable.  Follow.  

## 2021-11-01 NOTE — Assessment & Plan Note (Addendum)
Avoid antiinflammatories.  Stay hydrated.  Follow metabolic panel.  Last GFR 55. Continue benicar.

## 2021-11-01 NOTE — Assessment & Plan Note (Signed)
Low carb diet and exercise.  Follow met b and a1c.  

## 2021-11-01 NOTE — Assessment & Plan Note (Signed)
Colonoscopy 05/2017 - two polyps (tubular adenoma and hyperplastic).  Per report - due f/u.  Wants to see Dr Bary Castilla.

## 2021-11-01 NOTE — Assessment & Plan Note (Signed)
Recent liver function tests are wnl.

## 2021-11-01 NOTE — Assessment & Plan Note (Signed)
Has noticed over the past 3-4 weeks, some right shoulder - posterior shoulder discomfort.  Notices when in bed and rolls over - arm goes across her body.  Not a significant issue for her.  Does not really notice during the day.  She started keeping her grandson approximately 6 weeks ago.  Picks him up off the floor.  Holds him to feed - discussed positioning to do these task could be aggravating.  Discussed stretches.  Desires no further intervention.

## 2021-11-24 DIAGNOSIS — Z86010 Personal history of colonic polyps: Secondary | ICD-10-CM | POA: Diagnosis not present

## 2021-11-25 ENCOUNTER — Other Ambulatory Visit: Payer: Self-pay | Admitting: General Surgery

## 2021-11-25 NOTE — Progress Notes (Signed)
Progress Notes - documented in this encounter Lindwood Mogel, Geronimo Boot, MD - 11/24/2021 4:00 PM EDT Formatting of this note is different from the original. Subjective:   Patient ID: Lindsey Harmon is a 76 y.o. female.  HPI  The following portions of the patient's history were reviewed and updated as appropriate.  This an established patient is here today for: office visit. The patient has been referred by Dr. Einar Pheasant for evaluation of a colonoscopy. Patient had her last colonoscopy completed on 06-01-17. She states she does have a history of colon polyps. Patient denies any rectal bleeding or mucus. She reports bowel movements daily.   Chief Complaint  Patient presents with  Pre-op Exam    BP 128/68  Pulse 62  Temp 36.6 C (97.9 F)  Ht 162.6 cm ('5\' 4"'$ )  Wt 80.3 kg (177 lb)  SpO2 93%  BMI 30.38 kg/m   Past Medical History:  Diagnosis Date  Abnormal liver function  Cystocele  Fibrocystic disease of both breasts  GERD (gastroesophageal reflux disease)  Hyperlipidemia  Hyperplastic colon polyp 08/10/2016  Hypertension  Intrinsic sphincter deficiency  Pelvic relaxation  Rectocele  SUI (stress urinary incontinence, female)  Suppurative appendicitis  acute, S/P lap appendectomy  Tubular adenoma of colon 08/10/2016  Tubulovillous adenoma of colon 08/10/2016  Urinary retention  Daily self catherizations  Villous adenoma of colon 08/10/2016    Past Surgical History:  Procedure Laterality Date  TVT 08/09/2000  Release of vaginal tape 10/24/2000  URETHROPEXY ANTERIOR [facility]/BURCH ABDOMINAL 10/24/2000  COLONOSCOPY 08/10/2016  Tubular adenoma of colon/Tubulovillous Adenoma/Villous adenoma/Hyperplastic colon polyp/Repeat 55yrMUS  COLONOSCOPY 10/20/2016  Dr BBary Castilla COLONOSCOPY 06/01/2017  Dr BBary Castilla COLPORRHAPHY FOR REPAIR CYSTOCELE ANTERIOR  LAPAROSCOPIC APPENDECTOMY  Laparoscopic Burch urethropexy, laparoscopic colpopexy/culdoplasty and supracervical  hysterectomy.    OB History   Gravida  2  Para  2  Term  2  Preterm   AB   Living  2    SAB   IAB   Ectopic   Molar   Multiple   Live Births     Obstetric Comments  Age at first period 89Age of first pregnancy 89     Social History   Socioeconomic History  Marital status: Married  Tobacco Use  Smoking status: Never  Smokeless tobacco: Never  Substance and Sexual Activity  Alcohol use: No  Drug use: No  Sexual activity: Yes  Partners: Male  Birth control/protection: Surgical    Allergies  Allergen Reactions  Ace Inhibitors Cough   Current Outpatient Medications  Medication Sig Dispense Refill  bisoprolol (ZEBETA) 5 MG tablet Take 1 tablet (5 mg total) by mouth once daily  calcium carbonate-vitamin D3 (CALTRATE 600+D) 600 mg(1,'500mg'$ ) -200 unit tablet Take 1 tablet by mouth 2 (two) times daily with meals.  cephalexin (KEFLEX) 250 MG capsule Take 1 capsule (250 mg total) by mouth once daily  hydrochlorothiazide (HYDRODIURIL) 25 MG tablet Take 12.5 mg by mouth once daily.   olmesartan (BENICAR) 40 MG tablet Take 1 tablet (40 mg total) by mouth once daily  omega-3 fatty acids/fish oil 340-1,000 mg capsule Take 1 capsule by mouth 2 (two) times daily.  omeprazole (PRILOSEC) 20 MG DR capsule Take 20 mg by mouth 2 (two) times daily.  sertraline (ZOLOFT) 50 MG tablet Take 50 mg by mouth once daily.  albuterol 90 mcg/actuation inhaler Inhale 2 inhalations into the lungs every 4 (four) hours as needed (Patient not taking: Reported on 11/24/2021) 6.7 g 0  bisoprolol (ZEBETA) 5  MG tablet Take 5 mg by mouth once daily. (Patient not taking: Reported on 11/24/2021)  Compound Medication Estriol '1mg'$ /g Use small amount on area twice a day Disp 30g tube with 2 rf Called to [facility] (Patient not taking: Reported on 04/06/2017) 1 each 2  fluticasone propionate (FLONASE) 50 mcg/actuation nasal spray Place 1 spray into both nostrils 2 (two) times daily (Patient not  taking: Reported on 11/24/2021) 16 g 0  hydrocodone-chlorpheniramine (TUSSIONEX) 10-8 mg/5 mL ER suspension Take by mouth (Patient not taking: Reported on 11/24/2021)  losartan (COZAAR) 100 MG tablet Take 100 mg by mouth once daily. (Patient not taking: Reported on 04/16/2021)  simvastatin (ZOCOR) 20 MG tablet Take 20 mg by mouth nightly. (Patient not taking: Reported on 11/24/2021)   No current facility-administered medications for this visit.   Family History  Problem Relation Age of Onset  High blood pressure (Hypertension) Mother  Prostate cancer Father  Heart disease Paternal Grandfather  Prostate cancer Brother  Colon cancer Neg Hx  or other GI neoplasms  Colon polyps Neg Hx   Labs and Radiology:   August 10, 2016: Colonoscopy, Ruby Cola, MD:  A 50 mm polyp was found in the distal descending colon. The polyp was carpet-like and multi-lobulated. Biopsies were taken with a cold forceps for histology. This lesion was also marked with tattoo ink, and a second biopsy was taken from the center of the lesion, placed in a separate jar, marked center of lesion/distal descending tattooed lesion. A 4 mm polyp was found in the cecum. The polyp was sessile. The polyp was removed with a cold snare. Resection and retrieval were complete. Two flat polyps were found in the cecum. The polyps were 1 to 2 mm in size. These polyps were removed with a cold biopsy forceps. Resection and retrieval were complete.  DIAGNOSIS:  A. COLON POLYPS X 2, PROXIMAL SIGMOID; COLD BIOPSY:  - TUBULAR ADENOMAS, 5 FRAGMENTS.  - NEGATIVE FOR HIGH-GRADE DYSPLASIA AND MALIGNANCY.   B. COLON POLYP, DISTAL DESCENDING; COLD BIOPSY:  - VILLOUS ADENOMA, 2 FRAGMENTS.  - NEGATIVE FOR HIGH-GRADE DYSPLASIA AND MALIGNANCY.   C. COLON POLYP, CECUM; COLD SNARE:  - TUBULAR ADENOMA.  - NEGATIVE FOR HIGH-GRADE DYSPLASIA AND MALIGNANCY.   D. COLON POLYP X 2, CECUM; COLD BIOPSY:  - TUBULAR ADENOMAS, 2 FRAGMENTS.  - NEGATIVE  FOR HIGH-GRADE DYSPLASIA AND MALIGNANCY.   E. COLON POLYP, CECUM; COLD BIOPSY:  - TUBULOVILLOUS ADENOMA, MULTIPLE FRAGMENTS.  - NEGATIVE FOR HIGH-GRADE DYSPLASIA AND MALIGNANCY.   F. COLON POLYP, PROXIMAL ASCENDING; COLD BIOPSY:  - TUBULAR ADENOMA.  - NEGATIVE FOR HIGH-GRADE DYSPLASIA AND MALIGNANCY  October 22, 2016 colonoscopy:  A 45 mm polyp was found in the descending colon mid descending colon. The polyp was sessile. 12 cc NS injection utilized. Duckbill snare used. Central portion removed with biopsy forceps. To close a defect after mucosal resection, one hemostatic clip was successfully placed (MR conditional). There was no bleeding at the end of the procedure. Findings: A 10 mm polyp was found in the distal descending colon. The polyp was sessile. Biopsies were taken with a cold forceps for histology.  DIAGNOSIS:  A. COLON POLYP, PROXIMAL DESCENDING AT 60 CM; HOT SNARE:  - TUBULOVILLOUS ADENOMA, MULTIPLE FRAGMENTS.  - NEGATIVE FOR HIGH-GRADE DYSPLASIA AND MALIGNANCY.   B. COLON POLYP, DISTAL DESCENDING; HOT SNARE:  - TUBULAR ADENOMA, 3 FRAGMENTS.  - NEGATIVE FOR HIGH-GRADE DYSPLASIA AND MALIGNANCY.   C. BASE OF COLON POLYP, PROXIMAL DESCENDING AT 60 CM; COLD BIOPSY:  -  TWO MUCOSAL FRAGMENTS WITH CAUTERY ARTIFACT LIMITING ASSESSMENT FOR  DYSPLASIA.  - NEGATIVE FOR MALIGNANCY.   October 30, 2017 colonoscopy:  Two sessile polyps were found in the mid descending colon and distal transverse colon. The polyps were 5 to 7 mm in size. These were biopsied with a cold large-capacity forceps for histology  DIAGNOSIS:  A. COLON POLYP, TRANSVERSE; COLD BIOPSY:  - TUBULAR ADENOMA.  - NEGATIVE FOR HIGH-GRADE DYSPLASIA AND MALIGNANCY.   B. COLON POLYP, DESCENDING; COLD BIOPSY:  - HYPERPLASTIC POLYP.  - NEGATIVE FOR DYSPLASIA AND [place]  Review of Systems  Constitutional: Negative for chills and fever.  Respiratory: Negative for cough.    Objective:  Physical Exam Exam  conducted with a chaperone present.  Constitutional:  Appearance: Normal appearance.  Cardiovascular:  Rate and Rhythm: Normal rate and regular rhythm.  Pulses: Normal pulses.  Heart sounds: Normal heart sounds.  Pulmonary:  Effort: Pulmonary effort is normal.  Breath sounds: Normal breath sounds.  Musculoskeletal:  Cervical back: Neck supple.  Skin: General: Skin is warm and dry.  Neurological:  Mental Status: She is alert and oriented to person, place, and time.  Psychiatric:  Mood and Affect: Mood normal.  Behavior: Behavior normal.    Assessment:   History colonic, candidate for follow-up exam.  Plan:   Indications for repeat colonoscopy in light of multiple previously resected polyps, including villous adenomatous lesions were reviewed.  The patient has been asked to discontinue fish oil 1 week prior to the procedure.  This note is partially prepared by Ledell Noss, CMA acting as a scribe in the presence of Dr. Hervey Ard, MD.   The documentation recorded by the scribe accurately reflects the service I personally performed and the decisions made by me.   Robert Bellow, MD FACS   Electronically signed by Mayer Masker, MD at 11/25/2021 12:45 PM EDT

## 2021-12-23 ENCOUNTER — Encounter
Admission: RE | Disposition: A | Discharge status: Home / Self Care | Payer: Self-pay | Source: Home / Self Care | Attending: General Surgery

## 2021-12-23 ENCOUNTER — Ambulatory Visit: Payer: Medicare PPO | Admitting: Anesthesiology

## 2021-12-23 ENCOUNTER — Ambulatory Visit
Admission: RE | Admit: 2021-12-23 | Discharge: 2021-12-23 | Disposition: A | Discharge status: Home / Self Care | Payer: Medicare PPO | Attending: General Surgery | Admitting: General Surgery

## 2021-12-23 DIAGNOSIS — Z1211 Encounter for screening for malignant neoplasm of colon: Secondary | ICD-10-CM | POA: Insufficient documentation

## 2021-12-23 DIAGNOSIS — I10 Essential (primary) hypertension: Secondary | ICD-10-CM | POA: Diagnosis not present

## 2021-12-23 DIAGNOSIS — K219 Gastro-esophageal reflux disease without esophagitis: Secondary | ICD-10-CM | POA: Insufficient documentation

## 2021-12-23 DIAGNOSIS — D125 Benign neoplasm of sigmoid colon: Secondary | ICD-10-CM | POA: Diagnosis not present

## 2021-12-23 DIAGNOSIS — Z86010 Personal history of colonic polyps: Secondary | ICD-10-CM | POA: Diagnosis not present

## 2021-12-23 DIAGNOSIS — K635 Polyp of colon: Secondary | ICD-10-CM | POA: Diagnosis not present

## 2021-12-23 DIAGNOSIS — D124 Benign neoplasm of descending colon: Secondary | ICD-10-CM | POA: Insufficient documentation

## 2021-12-23 DIAGNOSIS — K5730 Diverticulosis of large intestine without perforation or abscess without bleeding: Secondary | ICD-10-CM | POA: Diagnosis not present

## 2021-12-23 HISTORY — PX: COLONOSCOPY WITH PROPOFOL: SHX5780

## 2021-12-23 SURGERY — COLONOSCOPY WITH PROPOFOL
Anesthesia: General

## 2021-12-23 MED ORDER — PHENYLEPHRINE HCL (PRESSORS) 10 MG/ML IV SOLN
INTRAVENOUS | Status: DC | PRN
  Administered 2021-12-23: 80 ug via INTRAVENOUS

## 2021-12-23 MED ORDER — SODIUM CHLORIDE 0.9 % IV SOLN: INTRAVENOUS | Status: DC

## 2021-12-23 MED ORDER — PROPOFOL 500 MG/50ML IV EMUL
INTRAVENOUS | Status: DC | PRN
  Administered 2021-12-23: 150 ug/kg/min via INTRAVENOUS

## 2021-12-23 MED ORDER — EPHEDRINE SULFATE (PRESSORS) 50 MG/ML IJ SOLN
INTRAMUSCULAR | Status: DC | PRN
  Administered 2021-12-23: 10 mg via INTRAVENOUS
  Administered 2021-12-23: 5 mg via INTRAVENOUS

## 2021-12-23 MED ORDER — PROPOFOL 10 MG/ML IV BOLUS
INTRAVENOUS | Status: DC | PRN
  Administered 2021-12-23: 50 mg via INTRAVENOUS

## 2021-12-23 MED ORDER — LIDOCAINE HCL (CARDIAC) PF 100 MG/5ML IV SOSY
PREFILLED_SYRINGE | INTRAVENOUS | Status: DC | PRN
  Administered 2021-12-23: 50 mg via INTRAVENOUS

## 2021-12-23 NOTE — Op Note (Signed)
[facility] Gastroenterology Patient Name: Lindsey Harmon Procedure Date: 12/23/2021 10:12 AM MRN: [id number] Account #: [id number] Date of Birth: [birth year] Admit Type: Outpatient Age: 76 Room: [facility] ENDO ROOM 1 Gender: Female Note Status: Finalized Instrument Name: Peds Colonoscope [id number] Procedure:             Colonoscopy Indications:           High risk colon cancer surveillance: Personal history                         of colonic polyps Providers:             Robert Bellow, MD Referring MD:          Einar Pheasant, MD (Referring MD) Medicines:             Propofol per Anesthesia Complications:         No immediate complications. Procedure:             Pre-Anesthesia Assessment:                        - Prior to the procedure, a History and Physical was                         performed, and patient medications, allergies and                         sensitivities were reviewed. The patient's tolerance                         of previous anesthesia was reviewed.                        - The risks and benefits of the procedure and the                         sedation options and risks were discussed with the                         patient. All questions were answered and informed                         consent was obtained.                        After obtaining informed consent, the colonoscope was                         passed under direct vision. Throughout the procedure,                         the patient's blood pressure, pulse, and oxygen                         saturations were monitored continuously. The                         Colonoscope was introduced through the anus and                         advanced  to the the cecum, identified by appendiceal                         orifice and ileocecal valve. The colonoscopy was                         somewhat difficult due to multiple diverticula in the                         colon. Successful  completion of the procedure was                         aided by changing the patient to a supine position.                         The patient tolerated the procedure well. The quality                         of the bowel preparation was excellent. Findings:      Three sessile polyps were found in the sigmoid colon and descending       colon. The polyps were 6 mm in size. These were removed with a cold       large-capacity forceps for histology.      The retroflexed view of the distal rectum and anal verge was normal and       showed no anal or rectal abnormalities. Impression:            - Three 6 mm polyps in the sigmoid colon and in the                         descending colon. Biopsied.                        - The distal rectum and anal verge are normal on                         retroflexion view. Recommendation:        - Telephone endoscopist for pathology results in 1                         week. Procedure Code(s):     --- Professional ---                        [phone], Colonoscopy, flexible; with biopsy, single or                         multiple Diagnosis Code(s):     --- Professional ---                        Z86.010, Personal history of colonic polyps                        K63.5, Polyp of colon CPT copyright 2019 American Medical Association. All rights reserved. The codes documented in this report are preliminary and upon coder review may  be revised to meet current compliance requirements. Robert Bellow, MD 12/23/2021 10:51:26 AM This report has been signed electronically. Number of Addenda:  0 Note Initiated On: 12/23/2021 10:12 AM Scope Withdrawal Time: 0 hours 11 minutes 1 second  Total Procedure Duration: 0 hours 20 minutes 32 seconds  Estimated Blood Loss:  Estimated blood loss: none.      [facility]

## 2021-12-23 NOTE — Anesthesia Procedure Notes (Signed)
Date/Time: 12/23/2021 10:20 AM  Performed by: Johnna Acosta, CRNAPre-anesthesia Checklist: Patient identified, Emergency Drugs available, Suction available, Patient being monitored and Timeout performed Patient Re-evaluated:Patient Re-evaluated prior to induction Oxygen Delivery Method: Nasal cannula Preoxygenation: Pre-oxygenation with 100% oxygen Induction Type: IV induction

## 2021-12-23 NOTE — H&P (Signed)
Lindsey Harmon [id number] 1945/06/08     HPI:  Healthy 76 y/o woman with past history of colon polyps. For repeat exam.  Tolerated prep with mild nausea,no vomiting.   Medications Prior to Admission  Medication Sig Dispense Refill Last Dose   bisoprolol (ZEBETA) 5 MG tablet Take 1 tablet (5 mg total) by mouth daily. 90 tablet 2 12/22/2021   cephALEXin (KEFLEX) 250 MG capsule TAKE 1 CAPSULE BY MOUTH EVERY DAY 90 capsule 2 12/22/2021   hydrochlorothiazide (MICROZIDE) 12.5 MG capsule TAKE 1 CAPSULE BY MOUTH EVERY DAY 90 capsule 1 12/22/2021   olmesartan (BENICAR) 40 MG tablet TAKE 1 TABLET BY MOUTH EVERY DAY 90 tablet 1 12/22/2021   omeprazole (PRILOSEC) 20 MG capsule TAKE 1 CAPSULE BY MOUTH TWICE A DAY 180 capsule 1 12/22/2021   sertraline (ZOLOFT) 50 MG tablet Take 1.5 tablets (75 mg total) by mouth daily. 135 tablet 3 12/22/2021   Calcium Carbonate-Vitamin D (CALTRATE 600+D PO) Take by mouth.      Omega-3 Fatty Acids (FISH OIL) 1200 MG CAPS Take by mouth daily.      rosuvastatin (CRESTOR) 20 MG tablet Take 1 tablet (20 mg total) by mouth daily. 90 tablet 2    Allergies  Allergen Reactions   Ace Inhibitors Cough    cough   Past Medical History:  Diagnosis Date   Abnormal liver function    Cystocele, unspecified (CODE)    Fibrocystic breast disease    GERD (gastroesophageal reflux disease)    Hypertension    Intrinsic sphincter deficiency    Pelvic relaxation    Pure hypercholesterolemia    Rectocele    SUI (stress urinary incontinence, female)    Urinary retention    Urinary retention    Past Surgical History:  Procedure Laterality Date   ABDOMINAL HYSTERECTOMY     APPENDECTOMY     Bladder Tack     burch urethropexy  10/24/2000   laparoscopic colpopexy/culdoplasty   COLONOSCOPY WITH PROPOFOL N/A 08/10/2016   Procedure: COLONOSCOPY WITH PROPOFOL;  Surgeon: Lollie Sails, MD;  Location: [facility] ENDOSCOPY;  Service: Endoscopy;  Laterality: N/A;   COLONOSCOPY WITH PROPOFOL N/A 10/20/2016    Procedure: COLONOSCOPY WITH PROPOFOL;  Surgeon: Robert Bellow, MD;  Location: [facility] ENDOSCOPY;  Service: Endoscopy;  Laterality: N/A;   COLONOSCOPY WITH PROPOFOL N/A 06/01/2017   Procedure: COLONOSCOPY WITH PROPOFOL;  Surgeon: Robert Bellow, MD;  Location: [facility] ENDOSCOPY;  Service: Endoscopy;  Laterality: N/A;   COLPORRHAPHY     forv repair cystocele anterior   TRANSVAGINAL TAPE (TVT) REMOVAL     Social History   Socioeconomic History   Marital status: Married    Spouse name: Not on file   Number of children: Not on file   Years of education: Not on file   Highest education level: Not on file  Occupational History   Not on file  Tobacco Use   Smoking status: Never   Smokeless tobacco: Never  Vaping Use   Vaping Use: Never used  Substance and Sexual Activity   Alcohol use: No    Alcohol/week: 0.0 standard drinks of alcohol   Drug use: No   Sexual activity: Not on file  Other Topics Concern   Not on file  Social History Narrative   Not on file   Social Determinants of Health   Financial Resource Strain: Low Risk  (02/16/2021)   Overall Financial Resource Strain (CARDIA)    Difficulty of Paying Living Expenses: Not hard at all  Food Insecurity: No Food Insecurity (02/16/2021)   Hunger Vital Sign    Worried About Running Out of Food in the Last Year: Never true    Ran Out of Food in the Last Year: Never true  Transportation Needs: No Transportation Needs (02/16/2021)   PRAPARE - Hydrologist (Medical): No    Lack of Transportation (Non-Medical): No  Physical Activity: Insufficiently Active (02/16/2021)   Exercise Vital Sign    Days of Exercise per Week: 7 days    Minutes of Exercise per Session: 10 min  Stress: No Stress Concern Present (02/16/2021)   [place]    Feeling of Stress : Not at all  Social Connections: [place] (02/16/2021)   Social Connection  and Isolation Panel [NHANES]    Frequency of Communication with Friends and Family: More than three times a week    Frequency of Social Gatherings with Friends and Family: More than three times a week    Attends Religious Services: More than 4 times per year    Active Member of [employer] or Organizations: Yes    Attends Archivist Meetings: Not on file    Marital Status: Married  Intimate Partner Violence: Not At Risk (02/16/2021)   Humiliation, Afraid, Rape, and Kick questionnaire    Fear of Current or Ex-Partner: No    Emotionally Abused: No    Physically Abused: No    Sexually Abused: No   Social History   Social History Narrative   Not on file     ROS: Negative.     PE: HEENT: Negative. Lungs: Clear. Cardio: RR.  Assessment/Plan:  Proceed with planned endoscopy.   Lindsey Harmon 12/23/2021

## 2021-12-23 NOTE — Anesthesia Preprocedure Evaluation (Signed)
Anesthesia Evaluation  Patient identified by MRN, date of birth, ID band Patient awake    Reviewed: Allergy & Precautions, NPO status , Patient's Chart, lab work & pertinent test results  History of Anesthesia Complications Negative for: history of anesthetic complications  Airway Mallampati: III  TM Distance: <3 FB Neck ROM: full    Dental  (+) Chipped, Poor Dentition   Pulmonary neg pulmonary ROS, neg shortness of breath,    Pulmonary exam normal        Cardiovascular Exercise Tolerance: Good hypertension, (-) anginaNormal cardiovascular exam     Neuro/Psych PSYCHIATRIC DISORDERS  Neuromuscular disease    GI/Hepatic Neg liver ROS, GERD  Controlled,  Endo/Other  negative endocrine ROS  Renal/GU Renal disease  negative genitourinary   Musculoskeletal   Abdominal   Peds  Hematology negative hematology ROS (+)   Anesthesia Other Findings Past Medical History: No date: Abnormal liver function No date: Cystocele, unspecified (CODE) No date: Fibrocystic breast disease No date: GERD (gastroesophageal reflux disease) No date: Hypertension No date: Intrinsic sphincter deficiency No date: Pelvic relaxation No date: Pure hypercholesterolemia No date: Rectocele No date: SUI (stress urinary incontinence, female) No date: Urinary retention No date: Urinary retention  Past Surgical History: No date: ABDOMINAL HYSTERECTOMY No date: APPENDECTOMY No date: Bladder Tack 10/24/2000: burch urethropexy     Comment:  laparoscopic colpopexy/culdoplasty 08/10/2016: COLONOSCOPY WITH PROPOFOL; N/A     Comment:  Procedure: COLONOSCOPY WITH PROPOFOL;  Surgeon: Lollie Sails, MD;  Location: [facility] ENDOSCOPY;  Service:               Endoscopy;  Laterality: N/A; 10/20/2016: COLONOSCOPY WITH PROPOFOL; N/A     Comment:  Procedure: COLONOSCOPY WITH PROPOFOL;  Surgeon: Robert Bellow, MD;  Location: [facility]  ENDOSCOPY;  Service:               Endoscopy;  Laterality: N/A; 06/01/2017: COLONOSCOPY WITH PROPOFOL; N/A     Comment:  Procedure: COLONOSCOPY WITH PROPOFOL;  Surgeon: Robert Bellow, MD;  Location: [facility] ENDOSCOPY;  Service:               Endoscopy;  Laterality: N/A; No date: COLPORRHAPHY     Comment:  forv repair cystocele anterior No date: TRANSVAGINAL TAPE (TVT) REMOVAL  BMI    Body Mass Index: 30.04 kg/m      Reproductive/Obstetrics negative OB ROS                             Anesthesia Physical Anesthesia Plan  ASA: 3  Anesthesia Plan: General   Post-op Pain Management:    Induction: Intravenous  PONV Risk Score and Plan: Propofol infusion and TIVA  Airway Management Planned: Natural Airway and Nasal Cannula  Additional Equipment:   Intra-op Plan:   Post-operative Plan:   Informed Consent: I have reviewed the patients History and Physical, chart, labs and discussed the procedure including the risks, benefits and alternatives for the proposed anesthesia with the patient or authorized representative who has indicated his/her understanding and acceptance.     Dental Advisory Given  Plan Discussed with: Anesthesiologist, CRNA and Surgeon  Anesthesia Plan Comments: (Patient consented for risks of anesthesia including but not limited to:  - adverse reactions  to medications - risk of airway placement if required - damage to eyes, teeth, lips or other oral mucosa - nerve damage due to positioning  - sore throat or hoarseness - Damage to heart, brain, nerves, lungs, other parts of body or loss of life  Patient voiced understanding.)        Anesthesia Quick Evaluation

## 2021-12-23 NOTE — Transfer of Care (Signed)
Immediate Anesthesia Transfer of Care Note  Patient: Lindsey Harmon  Procedure(s) Performed: COLONOSCOPY WITH PROPOFOL  Patient Location: PACU  Anesthesia Type:General  Level of Consciousness: awake, alert  and oriented  Airway & Oxygen Therapy: Patient Spontanous Breathing  Post-op Assessment: Report given to RN and Post -op Vital signs reviewed and stable  Post vital signs: Reviewed and stable  Last Vitals:  Vitals Value Taken Time  BP 77/52 12/23/21 1057  Temp 36.1 C 12/23/21 1051  Pulse 58 12/23/21 1057  Resp 15 12/23/21 1057  SpO2 93 % 12/23/21 1057    Last Pain:  Vitals:   12/23/21 1051  TempSrc: Temporal  PainSc: Asleep         Complications: No notable events documented.

## 2021-12-23 NOTE — Anesthesia Postprocedure Evaluation (Signed)
Anesthesia Post Note  Patient: MARGRETE DELUDE  Procedure(s) Performed: COLONOSCOPY WITH PROPOFOL  Patient location during evaluation: Endoscopy Anesthesia Type: General Level of consciousness: awake and alert Pain management: pain level controlled Vital Signs Assessment: post-procedure vital signs reviewed and stable Respiratory status: spontaneous breathing, nonlabored ventilation, respiratory function stable and patient connected to nasal cannula oxygen Cardiovascular status: blood pressure returned to baseline and stable Postop Assessment: no apparent nausea or vomiting Anesthetic complications: no   No notable events documented.   Last Vitals:  Vitals:   12/23/21 1057 12/23/21 1111  BP: (!) 77/52   Pulse: (!) 58 (!) 56  Resp: 15   Temp:    SpO2: 93% 97%    Last Pain:  Vitals:   12/23/21 1111  TempSrc:   PainSc: 0-No pain                 Precious Haws Zacchary Pompei

## 2021-12-24 ENCOUNTER — Encounter: Payer: Self-pay | Admitting: General Surgery

## 2021-12-24 LAB — SURGICAL PATHOLOGY

## 2022-01-04 ENCOUNTER — Other Ambulatory Visit: Payer: Self-pay | Admitting: Internal Medicine

## 2022-01-14 ENCOUNTER — Telehealth: Payer: Self-pay

## 2022-01-14 NOTE — Telephone Encounter (Signed)
I called patient to see who she used for her cath supplies. She does use [facility] & I have faxed form to them for her supplies.

## 2022-01-17 ENCOUNTER — Other Ambulatory Visit: Payer: Self-pay | Admitting: Internal Medicine

## 2022-01-24 ENCOUNTER — Other Ambulatory Visit: Payer: Self-pay | Admitting: Internal Medicine

## 2022-01-27 ENCOUNTER — Telehealth: Payer: Self-pay

## 2022-01-27 NOTE — Telephone Encounter (Signed)
Hope called from [employer] to state they received a prescription from Dr. Einar Pheasant for a straight tip catheter and sterile lube for patient.  Hope states she needs Dr. Nicki Reaper to select the diagnosis code in section A of the form, then initial, date, and return the form to them.

## 2022-01-27 NOTE — Telephone Encounter (Signed)
Printed form from chart. Will have Dr. Nicki Reaper complete and then fax back.

## 2022-02-03 ENCOUNTER — Encounter: Payer: Self-pay | Admitting: Internal Medicine

## 2022-02-04 NOTE — Telephone Encounter (Signed)
I signed an order for the supplies.  Please check and see if have copy of order that has been faxed and I can add diagnosis code.  If we do not have form, they need to resend.

## 2022-02-04 NOTE — Telephone Encounter (Signed)
Called [employer] and spoke to [place] and informed her that we did not have form that needs to be signed and dx code added. Aldona Bar stated that she would fax proper paperwork to our office. Fax # was given...[phone]

## 2022-02-04 NOTE — Telephone Encounter (Signed)
Noted.  Hold until receive form

## 2022-02-05 NOTE — Telephone Encounter (Addendum)
Form received and placed for Dr Nicki Reaper to review. Needs section A completed and signed and dated for the added information and faxed back

## 2022-02-05 NOTE — Telephone Encounter (Signed)
Signed and placed in boc.

## 2022-02-05 NOTE — Telephone Encounter (Signed)
See mychart message note, form was requested again as it was not received originally

## 2022-02-05 NOTE — Telephone Encounter (Signed)
Duplicate.  See other message.  Form signed and placed in box.

## 2022-02-08 NOTE — Telephone Encounter (Signed)
I have faxed this form to [facility] today with fax confirmation received.

## 2022-02-17 ENCOUNTER — Ambulatory Visit (INDEPENDENT_AMBULATORY_CARE_PROVIDER_SITE_OTHER): Payer: Medicare PPO

## 2022-02-17 VITALS — Ht 64.0 in | Wt 175.0 lb

## 2022-02-17 DIAGNOSIS — Z0000 Encounter for general adult medical examination without abnormal findings: Secondary | ICD-10-CM | POA: Diagnosis not present

## 2022-02-17 NOTE — Progress Notes (Addendum)
Subjective:   Lindsey Harmon is a 76 y.o. female who presents for Medicare Annual (Subsequent) preventive examination.  Review of Systems    No ROS.  Medicare Wellness Virtual Visit.  Visual/audio telehealth visit, UTA vital signs.   See social history for additional risk factors.   Cardiac Risk Factors include: advanced age (>8mn, >>29women);hypertension     Objective:    There were no vitals filed for this visit. There is no height or weight on file to calculate BMI.     02/17/2022    2:53 PM 12/23/2021    9:57 AM 02/16/2021    3:48 PM 01/16/2020    1:14 PM 11/27/2018    9:12 AM 09/22/2017   10:50 AM 06/01/2017    9:21 AM  Advanced Directives  Does Patient Have a Medical Advance Directive? Yes Yes Yes Yes Yes Yes Yes  Type of AParamedicof ADelawareLiving will  H[place]erveLiving will H[place]Living will HBloomfieldLiving will HCedar HillsLiving will HDunlapLiving will  Does patient want to make changes to medical advance directive? No - Patient declined  No - Patient declined No - Patient declined No - Patient declined No - Patient declined   Copy of [place] Chart? No - copy requested  No - copy requested No - copy requested No - copy requested No - copy requested No - copy requested    Current Medications (verified) Outpatient Encounter Medications as of 02/17/2022  Medication Sig   bisoprolol (ZEBETA) 5 MG tablet Take 1 tablet (5 mg total) by mouth daily.   Calcium Carbonate-Vitamin D (CALTRATE 600+D PO) Take by mouth.   cephALEXin (KEFLEX) 250 MG capsule TAKE 1 CAPSULE BY MOUTH EVERY DAY   hydrochlorothiazide (MICROZIDE) 12.5 MG capsule TAKE 1 CAPSULE BY MOUTH EVERY DAY   olmesartan (BENICAR) 40 MG tablet TAKE 1 TABLET BY MOUTH EVERY DAY   Omega-3 Fatty Acids (FISH OIL) 1200 MG CAPS Take by mouth daily.   omeprazole (PRILOSEC) 20 MG  capsule TAKE 1 CAPSULE BY MOUTH TWICE A DAY   rosuvastatin (CRESTOR) 20 MG tablet Take 1 tablet (20 mg total) by mouth daily.   sertraline (ZOLOFT) 50 MG tablet Take 1.5 tablets (75 mg total) by mouth daily.   No facility-administered encounter medications on file as of 02/17/2022.    Allergies (verified) Ace inhibitors   History: Past Medical History:  Diagnosis Date   Abnormal liver function    Cystocele, unspecified (CODE)    Fibrocystic breast disease    GERD (gastroesophageal reflux disease)    Hypertension    Intrinsic sphincter deficiency    Pelvic relaxation    Pure hypercholesterolemia    Rectocele    SUI (stress urinary incontinence, female)    Urinary retention    Urinary retention    Past Surgical History:  Procedure Laterality Date   ABDOMINAL HYSTERECTOMY     APPENDECTOMY     Bladder Tack     burch urethropexy  10/24/2000   laparoscopic colpopexy/culdoplasty   COLONOSCOPY WITH PROPOFOL N/A 08/10/2016   Procedure: COLONOSCOPY WITH PROPOFOL;  Surgeon: MLollie Sails MD;  [facility];  Service: Endoscopy;  Laterality: N/A;   COLONOSCOPY WITH PROPOFOL N/A 10/20/2016   Procedure: COLONOSCOPY WITH PROPOFOL;  Surgeon: BRobert Bellow MD;  Location: [facility] ENDOSCOPY;  Service: Endoscopy;  Laterality: N/A;   COLONOSCOPY WITH PROPOFOL N/A 06/01/2017   Procedure: COLONOSCOPY WITH  PROPOFOL;  Surgeon: Robert Bellow, MD;  Location: [facility] ENDOSCOPY;  Service: Endoscopy;  Laterality: N/A;   COLONOSCOPY WITH PROPOFOL N/A 12/23/2021   Procedure: COLONOSCOPY WITH PROPOFOL;  Surgeon: Robert Bellow, MD;  Location: [facility] ENDOSCOPY;  Service: Endoscopy;  Laterality: N/A;   COLPORRHAPHY     forv repair cystocele anterior   TRANSVAGINAL TAPE (TVT) REMOVAL     Family History  Problem Relation Age of Onset   Pneumonia Mother        died of presumed aspiration pneumonia   Hypertension Mother    Cancer Father        prostate   Heart disease Paternal  Grandfather        myocardial infarction   Alzheimer's disease Brother    Social History   Socioeconomic History   Marital status: Married    Spouse name: Not on file   Number of children: Not on file   Years of education: Not on file   Highest education level: Not on file  Occupational History   Not on file  Tobacco Use   Smoking status: Never   Smokeless tobacco: Never  Vaping Use   Vaping Use: Never used  Substance and Sexual Activity   Alcohol use: No    Alcohol/week: 0.0 standard drinks of alcohol   Drug use: No   Sexual activity: Not on file  Other Topics Concern   Not on file  Social History Narrative   Not on file   Social Determinants of Health   Financial Resource Strain: Low Risk  (02/17/2022)   Overall Financial Resource Strain (CARDIA)    Difficulty of Paying Living Expenses: Not hard at all  Food Insecurity: No Food Insecurity (02/17/2022)   Hunger Vital Sign    Worried About Running Out of Food in the Last Year: Never true    Ran Out of Food in the Last Year: Never true  Transportation Needs: No Transportation Needs (02/17/2022)   PRAPARE - Hydrologist (Medical): No    Lack of Transportation (Non-Medical): No  Physical Activity: Insufficiently Active (02/17/2022)   Exercise Vital Sign    Days of Exercise per Week: 7 days    Minutes of Exercise per Session: 10 min  Stress: No Stress Concern Present (02/17/2022)   [place]    Feeling of Stress : Not at all  Social Connections: [place] (02/17/2022)   Social Connection and Isolation Panel [NHANES]    Frequency of Communication with Friends and Family: More than three times a week    Frequency of Social Gatherings with Friends and Family: More than three times a week    Attends Religious Services: More than 4 times per year    Active Member of [employer] or Organizations: Yes    Attends Theatre manager Meetings: Not on file    Marital Status: Married    Tobacco Counseling Counseling given: Not Answered   Clinical Intake:                          Activities of Daily Living    02/17/2022    2:58 PM  In your present state of health, do you have any difficulty performing the following activities:  Hearing? 0  Vision? 0  Difficulty concentrating or making decisions? 0  Walking or climbing stairs? 0  Dressing or bathing? 0  Doing errands, shopping? 0  Preparing  Food and eating ? N  Using the Toilet? N  In the past six months, have you accidently leaked urine? N  Comment Self catherize daily  Do you have problems with loss of bowel control? N  Managing your Medications? N  Managing your Finances? N  Housekeeping or managing your Housekeeping? N    Patient Care Team: Einar Pheasant, MD as PCP - General (Internal Medicine)  Indicate any recent Medical Services you may have received from other than [facility] providers in the past year (date may be approximate).     Assessment:   This is a routine wellness examination for Lindsey Harmon.  I connected with  Lindsey Harmon on 02/17/22 by a audio enabled telemedicine application and verified that I am speaking with the correct person using two identifiers.  Patient Location: Home  Provider Location: Office/Clinic  I discussed the limitations of evaluation and management by telemedicine. The patient expressed understanding and agreed to proceed.   Hearing/Vision screen Hearing Screening - Comments:: Patient is able to hear conversational tones without difficulty. No issues reported. Vision Screening - Comments:: Followed by Dr. Ellin Mayhew Wears corrective lenses They have seen their ophthalmologist in the last 12 months  Dietary issues and exercise activities discussed: Current Exercise Habits: Home exercise routine, Time (Minutes): 10, Frequency (Times/Week): 7, Weekly Exercise (Minutes/Week): 70, Intensity:  Mild   Goals Addressed             This Visit's Progress    Increase physical activity   On track    Standing/chair and stretch exercises 10 minutes daily.          Depression Screen    02/17/2022    2:55 PM 10/30/2021   10:34 AM 02/16/2021    3:47 PM 02/01/2020   10:44 AM 01/16/2020    1:11 PM 11/27/2018    9:13 AM 09/22/2017   10:24 AM  PHQ 2/9 Scores  PHQ - 2 Score 0 0 0 0 0 0 0    Fall Risk    02/17/2022    2:56 PM 10/30/2021   10:34 AM 02/16/2021    3:56 PM 02/01/2020   10:44 AM 01/16/2020    1:16 PM  Fall Risk   Falls in the past year? 0 0 0 0 0  Number falls in past yr: 0   0 0  Injury with Fall? 0  0 0   Risk for fall due to : No Fall Risks No Fall Risks     Follow up Falls evaluation completed Falls evaluation completed  Falls evaluation completed Falls evaluation completed    [place]: Home free of loose throw rugs in walkways, pet beds, electrical cords, etc? Yes  Adequate lighting in your home to reduce risk of falls? Yes   ASSISTIVE DEVICES UTILIZED TO PREVENT FALLS: Life alert? No  Use of a cane, walker or w/c? No  Grab bars in the bathroom? No  Shower chair or bench in shower? No  Elevated toilet seat or a handicapped toilet? No   TIMED UP AND GO: Was the test performed? No .   Cognitive Function:    09/22/2017   10:51 AM 09/21/2016   11:04 AM  MMSE - Mini Mental State Exam  Orientation to time 5 5  Orientation to Place 5 5  Registration 3 3  Attention/ Calculation 5 5  Recall 3 3  Language- name 2 objects 2 2  Language- repeat 1 1  Language- follow 3 step  command 3 3  Language- read & follow direction 1 1  Write a sentence 1 1  Copy design 1 1  Total score 30 30        11/27/2018    9:14 AM  6CIT Screen  What Year? 0 points  What month? 0 points  What time? 0 points  Count back from 20 0 points  Months in reverse 0 points  Repeat phrase 0 points  Total Score 0 points     Immunizations Immunization History  Administered Date(s) Administered   Fluad Quad(high Dose 65+) 01/25/2019, 02/01/2020, 02/20/2021   Influenza, High Dose Seasonal PF 02/17/2016, 02/22/2017, 02/28/2018   PFIZER Comirnaty(Gray Top)Covid-19 Tri-Sucrose Vaccine 09/11/2020   PFIZER(Purple Top)SARS-COV-2 Vaccination 06/05/2019, 06/30/2019, 03/05/2020   Pfizer Covid-19 Vaccine Bivalent Booster 62yr & up 03/12/2021   Pneumococcal Conjugate-13 11/24/2016   Pneumococcal Polysaccharide-23 06/01/2018    TDAP status: Due, Education has been provided regarding the importance of this vaccine. Advised may receive this vaccine at local pharmacy or Health Dept. Aware to provide a copy of the vaccination record if obtained from local pharmacy or Health Dept. Verbalized acceptance and understanding. Deferred per patient preference.   Flu Vaccine status: Due, Education has been provided regarding the importance of this vaccine. Advised may receive this vaccine at local pharmacy or Health Dept. Aware to provide a copy of the vaccination record if obtained from local pharmacy or Health Dept. Verbalized acceptance and understanding.  Covid-19 vaccine status: Completed vaccines x5.  Shingrix Completed?: No.    Education has been provided regarding the importance of this vaccine. Patient has been advised to call insurance company to determine out of pocket expense if they have not yet received this vaccine. Advised may also receive vaccine at local pharmacy or Health Dept. Verbalized acceptance and understanding.  Screening Tests Health Maintenance  Topic Date Due   DEXA SCAN  Never done   INFLUENZA VACCINE  02/28/2022 (Originally 12/15/2021)   Hepatitis C Screening  03/01/2022 (Originally 05/21/1963)   COVID-19 Vaccine (6 - Pfizer risk series) 03/05/2022 (Originally 05/07/2021)   TETANUS/TDAP  04/16/2022 (Originally 05/21/1964)   Zoster Vaccines- Shingrix (1 of 2) 05/20/2022 (Originally 05/21/1964)   Pneumonia  Vaccine 78 Years old  Completed   HPV VACCINES  Aged Out   COLONOSCOPY (Pts 45-434yrInsurance coverage will need to be confirmed)  Discontinued   Health Maintenance Health Maintenance Due  Topic Date Due   DEXA SCAN  Never done   Lung Cancer Screening: (Low Dose CT Chest recommended if Age 184-80ears, 30 pack-year currently smoking OR have quit w/in 15years.) does not qualify.   Hepatitis C Screening: deferred per patient preference.   Vision Screening: Recommended annual ophthalmology exams for early detection of glaucoma and other disorders of the eye.  Dental Screening: Recommended annual dental exams for proper oral hygiene  Community Resource Referral / Chronic Care Management: CRR required this visit?  No   CCM required this visit?  No      Plan:     I have personally reviewed and noted the following in the patient's chart:   Medical and social history Use of alcohol, tobacco or illicit drugs  Current medications and supplements including opioid prescriptions. Patient is not currently taking opioid prescriptions. Functional ability and status Nutritional status Physical activity Advanced directives List of other physicians Hospitalizations, surgeries, and ER visits in previous 12 months Vitals Screenings to include cognitive, depression, and falls Referrals and appointments  In addition, I have reviewed and discussed  with patient certain preventive protocols, quality metrics, and best practice recommendations. A written personalized care plan for preventive services as well as general preventive health recommendations were provided to patient.     OBrien-Blaney, Sylvana Bonk L, LPN   [id number]/06/[id number]      I have reviewed the above information and agree with above.   Deborra Medina, MD

## 2022-02-17 NOTE — Patient Instructions (Addendum)
Lindsey Harmon , Thank you for taking time to come for your Medicare Wellness Visit. I appreciate your ongoing commitment to your health goals. Please review the following plan we discussed and let me know if I can assist you in the future.   These are the goals we discussed:  Goals      Increase physical activity     Standing/chair and stretch exercises 10 minutes daily.           This is a list of the screening recommended for you and due dates:  Health Maintenance  Topic Date Due   DEXA scan (bone density measurement)  Never done   Flu Shot  02/28/2022*   Hepatitis C Screening: USPSTF Recommendation to screen - Ages 18-79 yo.  03/01/2022*   COVID-19 Vaccine (6 - Pfizer risk series) 03/05/2022*   Tetanus Vaccine  04/16/2022*   Zoster (Shingles) Vaccine (1 of 2) 05/20/2022*   Pneumonia Vaccine  Completed   HPV Vaccine  Aged Out   Colon Cancer Screening  Discontinued  *Topic was postponed. The date shown is not the original due date.    Advanced directives: End of life planning; Advance aging; Advanced directives discussed.  Copy of current HCPOA/Living Will requested.    Conditions/risks identified: none new  Next appointment: Follow up in one year for your annual wellness visit    Preventive Care 65 Years and Older, Female Preventive care refers to lifestyle choices and visits with your health care provider that can promote health and wellness. What does preventive care include? A yearly physical exam. This is also called an annual well check. Dental exams once or twice a year. Routine eye exams. Ask your health care provider how often you should have your eyes checked. Personal lifestyle choices, including: Daily care of your teeth and gums. Regular physical activity. Eating a healthy diet. Avoiding tobacco and drug use. Limiting alcohol use. Practicing safe sex. Taking low-dose aspirin every day. Taking vitamin and mineral supplements as recommended by your health care  provider. What happens during an annual well check? The services and screenings done by your health care provider during your annual well check will depend on your age, overall health, lifestyle risk factors, and family history of disease. Counseling  Your health care provider may ask you questions about your: Alcohol use. Tobacco use. Drug use. Emotional well-being. Home and relationship well-being. Sexual activity. Eating habits. History of falls. Memory and ability to understand (cognition). Work and work Statistician. Reproductive health. Screening  You may have the following tests or measurements: Height, weight, and BMI. Blood pressure. Lipid and cholesterol levels. These may be checked every 5 years, or more frequently if you are over 64 years old. Skin check. Lung cancer screening. You may have this screening every year starting at age 29 if you have a 30-pack-year history of smoking and currently smoke or have quit within the past 15 years. Fecal occult blood test (FOBT) of the stool. You may have this test every year starting at age 52. Flexible sigmoidoscopy or colonoscopy. You may have a sigmoidoscopy every 5 years or a colonoscopy every 10 years starting at age 51. Hepatitis C blood test. Hepatitis B blood test. Sexually transmitted disease (STD) testing. Diabetes screening. This is done by checking your blood sugar (glucose) after you have not eaten for a while (fasting). You may have this done every 1-3 years. Bone density scan. This is done to screen for osteoporosis. You may have this done starting at age  65. Mammogram. This may be done every 1-2 years. Talk to your health care provider about how often you should have regular mammograms. Talk with your health care provider about your test results, treatment options, and if necessary, the need for more tests. Vaccines  Your health care provider may recommend certain vaccines, such as: Influenza vaccine. This is  recommended every year. Tetanus, diphtheria, and acellular pertussis (Tdap, Td) vaccine. You may need a Td booster every 10 years. Zoster vaccine. You may need this after age 81. Pneumococcal 13-valent conjugate (PCV13) vaccine. One dose is recommended after age 71. Pneumococcal polysaccharide (PPSV23) vaccine. One dose is recommended after age 36. Talk to your health care provider about which screenings and vaccines you need and how often you need them. This information is not intended to replace advice given to you by your health care provider. Make sure you discuss any questions you have with your health care provider. Document Released: 05/30/2015 Document Revised: 01/21/2016 Document Reviewed: 03/04/2015 Elsevier Interactive Patient Education  2017 [place] Prevention in the Home Falls can cause injuries. They can happen to people of all ages. There are many things you can do to make your home safe and to help prevent falls. What can I do on the outside of my home? Regularly fix the edges of walkways and driveways and fix any cracks. Remove anything that might make you trip as you walk through a door, such as a raised step or threshold. Trim any bushes or trees on the path to your home. Use bright outdoor lighting. Clear any walking paths of anything that might make someone trip, such as rocks or tools. Regularly check to see if handrails are loose or broken. Make sure that both sides of any steps have handrails. Any raised decks and porches should have guardrails on the edges. Have any leaves, snow, or ice cleared regularly. Use sand or salt on walking paths during winter. Clean up any spills in your garage right away. This includes oil or grease spills. What can I do in the bathroom? Use night lights. Install grab bars by the toilet and in the tub and shower. Do not use towel bars as grab bars. Use non-skid mats or decals in the tub or shower. If you need to sit down in  the shower, use a plastic, non-slip stool. Keep the floor dry. Clean up any water that spills on the floor as soon as it happens. Remove soap buildup in the tub or shower regularly. Attach bath mats securely with double-sided non-slip rug tape. Do not have throw rugs and other things on the floor that can make you trip. What can I do in the bedroom? Use night lights. Make sure that you have a light by your bed that is easy to reach. Do not use any sheets or blankets that are too big for your bed. They should not hang down onto the floor. Have a firm chair that has side arms. You can use this for support while you get dressed. Do not have throw rugs and other things on the floor that can make you trip. What can I do in the kitchen? Clean up any spills right away. Avoid walking on wet floors. Keep items that you use a lot in easy-to-reach places. If you need to reach something above you, use a strong step stool that has a grab bar. Keep electrical cords out of the way. Do not use floor polish or wax that makes floors slippery.  If you must use wax, use non-skid floor wax. Do not have throw rugs and other things on the floor that can make you trip. What can I do with my stairs? Do not leave any items on the stairs. Make sure that there are handrails on both sides of the stairs and use them. Fix handrails that are broken or loose. Make sure that handrails are as long as the stairways. Check any carpeting to make sure that it is firmly attached to the stairs. Fix any carpet that is loose or worn. Avoid having throw rugs at the top or bottom of the stairs. If you do have throw rugs, attach them to the floor with carpet tape. Make sure that you have a light switch at the top of the stairs and the bottom of the stairs. If you do not have them, ask someone to add them for you. What else can I do to help prevent falls? Wear shoes that: Do not have high heels. Have rubber bottoms. Are comfortable  and fit you well. Are closed at the toe. Do not wear sandals. If you use a stepladder: Make sure that it is fully opened. Do not climb a closed stepladder. Make sure that both sides of the stepladder are locked into place. Ask someone to hold it for you, if possible. Clearly mark and make sure that you can see: Any grab bars or handrails. First and last steps. Where the edge of each step is. Use tools that help you move around (mobility aids) if they are needed. These include: Canes. Walkers. Scooters. Crutches. Turn on the lights when you go into a dark area. Replace any light bulbs as soon as they burn out. Set up your furniture so you have a clear path. Avoid moving your furniture around. If any of your floors are uneven, fix them. If there are any pets around you, be aware of where they are. Review your medicines with your doctor. Some medicines can make you feel dizzy. This can increase your chance of falling. Ask your doctor what other things that you can do to help prevent falls. This information is not intended to replace advice given to you by your health care provider. Make sure you discuss any questions you have with your health care provider. Document Released: 02/27/2009 Document Revised: 10/09/2015 Document Reviewed: 06/07/2014 Elsevier Interactive Patient Education  2017 [employer].

## 2022-02-19 ENCOUNTER — Telehealth: Payer: Self-pay

## 2022-02-19 NOTE — Telephone Encounter (Signed)
Hope called from [facility] to state they did receive the form we faxed to them on 02/08/2022, which has the diagnosis.  Hope states that they need Dr. Einar Pheasant to initial and date next to R32 diagnosis in section A for approval.  Please fax completed form to:  [phone].  Hope asked that we please call her back with any questions.

## 2022-02-22 NOTE — Telephone Encounter (Signed)
Signed and placed in box at [employer].

## 2022-03-02 ENCOUNTER — Ambulatory Visit: Payer: Medicare PPO | Admitting: Internal Medicine

## 2022-03-02 ENCOUNTER — Encounter: Payer: Self-pay | Admitting: Internal Medicine

## 2022-03-02 VITALS — BP 120/72 | HR 50 | Temp 98.7°F | Ht 64.0 in | Wt 179.0 lb

## 2022-03-02 DIAGNOSIS — I10 Essential (primary) hypertension: Secondary | ICD-10-CM

## 2022-03-02 DIAGNOSIS — E7800 Pure hypercholesterolemia, unspecified: Secondary | ICD-10-CM

## 2022-03-02 DIAGNOSIS — N183 Chronic kidney disease, stage 3 unspecified: Secondary | ICD-10-CM | POA: Diagnosis not present

## 2022-03-02 DIAGNOSIS — R739 Hyperglycemia, unspecified: Secondary | ICD-10-CM | POA: Diagnosis not present

## 2022-03-02 DIAGNOSIS — F429 Obsessive-compulsive disorder, unspecified: Secondary | ICD-10-CM | POA: Diagnosis not present

## 2022-03-02 DIAGNOSIS — R945 Abnormal results of liver function studies: Secondary | ICD-10-CM

## 2022-03-02 DIAGNOSIS — Z23 Encounter for immunization: Secondary | ICD-10-CM

## 2022-03-02 DIAGNOSIS — Z86010 Personal history of colonic polyps: Secondary | ICD-10-CM | POA: Diagnosis not present

## 2022-03-02 LAB — BASIC METABOLIC PANEL
BUN: 24 mg/dL — ABNORMAL HIGH (ref 6–23)
CO2: 30 mEq/L (ref 19–32)
Calcium: 9.7 mg/dL (ref 8.4–10.5)
Chloride: 103 mEq/L (ref 96–112)
Creatinine, Ser: 0.94 mg/dL (ref 0.40–1.20)
GFR: 58.01 mL/min — ABNORMAL LOW (ref >60.00)
Glucose, Bld: 90 mg/dL (ref 70–99)
Potassium: 4.2 mEq/L (ref 3.5–5.1)
Sodium: 140 mEq/L (ref 135–145)

## 2022-03-02 LAB — HEMOGLOBIN A1C: Hgb A1c MFr Bld: 6.3 % (ref 4.6–6.5)

## 2022-03-02 LAB — HEPATIC FUNCTION PANEL
ALT: 20 U/L (ref 0–35)
AST: 24 U/L (ref 0–37)
Albumin: 4.5 g/dL (ref 3.5–5.2)
Alkaline Phosphatase: 60 U/L (ref 39–117)
Bilirubin, Direct: 0.2 mg/dL (ref 0.0–0.3)
Total Bilirubin: 1 mg/dL (ref 0.2–1.2)
Total Protein: 6.6 g/dL (ref 6.0–8.3)

## 2022-03-02 LAB — LIPID PANEL
Cholesterol: 121 mg/dL (ref 0–200)
HDL: 45.5 mg/dL (ref >39.00)
LDL Cholesterol: 45 mg/dL (ref 0–99)
NonHDL: 75.36
Total CHOL/HDL Ratio: 3
Triglycerides: 152 mg/dL — ABNORMAL HIGH (ref 0.0–149.0)
VLDL: 30.4 mg/dL (ref 0.0–40.0)

## 2022-03-02 NOTE — Progress Notes (Signed)
Patient ID: Lindsey Harmon, female   DOB: [birth year], 76 y.o.   MRN: [id number]   Subjective:    Patient ID: Lindsey Harmon, female    DOB: 12-22-1945, 76 y.o.   MRN: [id number]   Patient here for  Chief Complaint  Patient presents with   Follow-up    4 month f/u and pt wanted the flu shot as well    .   HPI Here to follow up regarding her cholesterol.  On crestor.  Tries to stay active.  No chest pain or sob.  No cough or congestion.  No acid reflux reported.  No abdominal pain.  Bowels moving. Stable on zoloft.   Past Medical History:  Diagnosis Date   Abnormal liver function    Cystocele, unspecified (CODE)    Fibrocystic breast disease    GERD (gastroesophageal reflux disease)    Hypertension    Intrinsic sphincter deficiency    Pelvic relaxation    Pure hypercholesterolemia    Rectocele    SUI (stress urinary incontinence, female)    Urinary retention    Urinary retention    Past Surgical History:  Procedure Laterality Date   ABDOMINAL HYSTERECTOMY     APPENDECTOMY     Bladder Tack     burch urethropexy  10/24/2000   laparoscopic colpopexy/culdoplasty   COLONOSCOPY WITH PROPOFOL N/A 08/10/2016   Procedure: COLONOSCOPY WITH PROPOFOL;  Surgeon: Lollie Sails, MD;  Location: [facility] ENDOSCOPY;  Service: Endoscopy;  Laterality: N/A;   COLONOSCOPY WITH PROPOFOL N/A 10/20/2016   Procedure: COLONOSCOPY WITH PROPOFOL;  Surgeon: Robert Bellow, MD;  Location: [facility] ENDOSCOPY;  Service: Endoscopy;  Laterality: N/A;   COLONOSCOPY WITH PROPOFOL N/A 06/01/2017   Procedure: COLONOSCOPY WITH PROPOFOL;  Surgeon: Robert Bellow, MD;  Location: [facility] ENDOSCOPY;  Service: Endoscopy;  Laterality: N/A;   COLONOSCOPY WITH PROPOFOL N/A 12/23/2021   Procedure: COLONOSCOPY WITH PROPOFOL;  Surgeon: Robert Bellow, MD;  Location: [facility] ENDOSCOPY;  Service: Endoscopy;  Laterality: N/A;   COLPORRHAPHY     forv repair cystocele anterior   TRANSVAGINAL TAPE (TVT) REMOVAL     Family History   Problem Relation Age of Onset   Pneumonia Mother        died of presumed aspiration pneumonia   Hypertension Mother    Cancer Father        prostate   Heart disease Paternal Grandfather        myocardial infarction   Alzheimer's disease Brother    Social History   Socioeconomic History   Marital status: Married    Spouse name: Not on file   Number of children: Not on file   Years of education: Not on file   Highest education level: Not on file  Occupational History   Not on file  Tobacco Use   Smoking status: Never   Smokeless tobacco: Never  Vaping Use   Vaping Use: Never used  Substance and Sexual Activity   Alcohol use: No    Alcohol/week: 0.0 standard drinks of alcohol   Drug use: No   Sexual activity: Not on file  Other Topics Concern   Not on file  Social History Narrative   Not on file   Social Determinants of Health   Financial Resource Strain: Low Risk  (02/17/2022)   Overall Financial Resource Strain (CARDIA)    Difficulty of Paying Living Expenses: Not hard at all  Food Insecurity: No Food Insecurity (02/17/2022)   Hunger Vital Sign  Worried About Charity fundraiser in the Last Year: Never true    [place] in the Last Year: Never true  Transportation Needs: No Transportation Needs (02/17/2022)   PRAPARE - Hydrologist (Medical): No    Lack of Transportation (Non-Medical): No  Physical Activity: Insufficiently Active (02/17/2022)   Exercise Vital Sign    Days of Exercise per Week: 7 days    Minutes of Exercise per Session: 10 min  Stress: No Stress Concern Present (02/17/2022)   [place]    Feeling of Stress : Not at all  Social Connections: [place] (02/17/2022)   Social Connection and Isolation Panel [NHANES]    Frequency of Communication with Friends and Family: More than three times a week    Frequency of Social Gatherings with  Friends and Family: More than three times a week    Attends Religious Services: More than 4 times per year    Active Member of [employer] or Organizations: Yes    Attends Archivist Meetings: Not on file    Marital Status: Married     Review of Systems  Constitutional:  Negative for appetite change and unexpected weight change.  HENT:  Negative for congestion and sinus pressure.   Respiratory:  Negative for cough, chest tightness and shortness of breath.   Cardiovascular:  Negative for chest pain, palpitations and leg swelling.  Gastrointestinal:  Negative for abdominal pain, diarrhea, nausea and vomiting.  Genitourinary:  Negative for difficulty urinating and dysuria.  Musculoskeletal:  Negative for joint swelling and myalgias.  Skin:  Negative for color change and rash.  Neurological:  Negative for dizziness, light-headedness and headaches.  Psychiatric/Behavioral:  Negative for agitation and dysphoric mood.        Objective:     BP 120/72   Pulse (!) 50   Temp 98.7 F (37.1 C) (Oral)   Ht '5\' 4"'  (1.626 m)   Wt 179 lb (81.2 kg)   SpO2 96%   BMI 30.73 kg/m  Wt Readings from Last 3 Encounters:  03/02/22 179 lb (81.2 kg)  02/17/22 175 lb (79.4 kg)  12/23/21 175 lb (79.4 kg)    Physical Exam Vitals reviewed.  Constitutional:      General: She is not in acute distress.    Appearance: Normal appearance.  HENT:     Head: Normocephalic and atraumatic.     Right Ear: External ear normal.     Left Ear: External ear normal.  Eyes:     General: No scleral icterus.       Right eye: No discharge.        Left eye: No discharge.     Conjunctiva/sclera: Conjunctivae normal.  Neck:     Thyroid: No thyromegaly.  Cardiovascular:     Rate and Rhythm: Normal rate and regular rhythm.  Pulmonary:     Effort: No respiratory distress.     Breath sounds: Normal breath sounds. No wheezing.  Abdominal:     General: Bowel sounds are normal.     Palpations: Abdomen is soft.      Tenderness: There is no abdominal tenderness.  Musculoskeletal:        General: No swelling or tenderness.     Cervical back: Neck supple. No tenderness.  Lymphadenopathy:     Cervical: No cervical adenopathy.  Skin:    Findings: No erythema or rash.  Neurological:     Mental  Status: She is alert.  Psychiatric:        Mood and Affect: Mood normal.        Behavior: Behavior normal.      Outpatient Encounter Medications as of 03/02/2022  Medication Sig   bisoprolol (ZEBETA) 5 MG tablet Take 1 tablet (5 mg total) by mouth daily.   Calcium Carbonate-Vitamin D (CALTRATE 600+D PO) Take by mouth.   cephALEXin (KEFLEX) 250 MG capsule TAKE 1 CAPSULE BY MOUTH EVERY DAY   hydrochlorothiazide (MICROZIDE) 12.5 MG capsule TAKE 1 CAPSULE BY MOUTH EVERY DAY   olmesartan (BENICAR) 40 MG tablet TAKE 1 TABLET BY MOUTH EVERY DAY   Omega-3 Fatty Acids (FISH OIL) 1200 MG CAPS Take by mouth daily.   omeprazole (PRILOSEC) 20 MG capsule TAKE 1 CAPSULE BY MOUTH TWICE A DAY   rosuvastatin (CRESTOR) 20 MG tablet Take 1 tablet (20 mg total) by mouth daily.   sertraline (ZOLOFT) 50 MG tablet Take 1.5 tablets (75 mg total) by mouth daily.   No facility-administered encounter medications on file as of 03/02/2022.     Lab Results  Component Value Date   WBC 5.0 06/29/2021   HGB 13.2 06/29/2021   HCT 39.3 06/29/2021   PLT 180.0 06/29/2021   GLUCOSE 90 03/02/2022   CHOL 121 03/02/2022   TRIG 152.0 (H) 03/02/2022   HDL 45.50 03/02/2022   LDLDIRECT 83.0 05/30/2018   LDLCALC 45 03/02/2022   ALT 20 03/02/2022   AST 24 03/02/2022   NA 140 03/02/2022   K 4.2 03/02/2022   CL 103 03/02/2022   CREATININE 0.94 03/02/2022   BUN 24 (H) 03/02/2022   CO2 30 03/02/2022   TSH 3.79 06/29/2021   HGBA1C 6.3 03/02/2022       Assessment & Plan:   Problem List Items Addressed This Visit     Abnormal liver function    Recheck liver panel today with labs.       CKD (chronic kidney disease) stage 3, GFR 30-59  ml/min (HCC)    Avoid antiinflammatories.  Stay hydrated.  Follow metabolic panel.  Continue benicar.       Essential hypertension, benign - Primary    Blood pressure doing well.  On benicar.  Follow pressures.  Follow metabolic panel.       Relevant Orders   Basic metabolic panel (Completed)   History of colon polyps    Colonoscopy 12/23/21 - Three 6 mm polyps in the sigmoid colon and in the descending colon - pathology tubular adenomas.       Hypercholesterolemia    On crestor 81m q day.  Low cholesterol diet and exercise.  Follow lipid panel and liver function tests.        Relevant Orders   Hepatic function panel (Completed)   Lipid panel (Completed)   Hyperglycemia    Low carb diet and exercise.  Follow met b and a1c.       Relevant Orders   Hemoglobin A1c (Completed)   Obsessive compulsive disorder    Continue zoloft.  Stable.       Other Visit Diagnoses     Need for immunization against influenza       Relevant Orders   Flu Vaccine QUAD High Dose(Fluad) (Completed)        CEinar Pheasant MD

## 2022-03-07 ENCOUNTER — Encounter: Payer: Self-pay | Admitting: Internal Medicine

## 2022-03-07 NOTE — Assessment & Plan Note (Signed)
Continue zoloft.  Stable.  

## 2022-03-07 NOTE — Assessment & Plan Note (Signed)
Blood pressure doing well.  On benicar.  Follow pressures.  Follow metabolic panel.  

## 2022-03-07 NOTE — Assessment & Plan Note (Signed)
Recheck liver panel today with labs.

## 2022-03-07 NOTE — Assessment & Plan Note (Signed)
Colonoscopy 12/23/21 - Three 6 mm polyps in the sigmoid colon and in the descending colon - pathology tubular adenomas.

## 2022-03-07 NOTE — Assessment & Plan Note (Signed)
Low carb diet and exercise.  Follow met b and a1c.  

## 2022-03-07 NOTE — Assessment & Plan Note (Signed)
Avoid antiinflammatories.  Stay hydrated.  Follow metabolic panel.  Continue benicar.  

## 2022-03-07 NOTE — Assessment & Plan Note (Signed)
On crestor 20mg q day.  Low cholesterol diet and exercise.  Follow lipid panel and liver function tests.   

## 2022-05-20 DIAGNOSIS — H35371 Puckering of macula, right eye: Secondary | ICD-10-CM | POA: Diagnosis not present

## 2022-05-20 DIAGNOSIS — D3132 Benign neoplasm of left choroid: Secondary | ICD-10-CM | POA: Diagnosis not present

## 2022-05-20 DIAGNOSIS — H2513 Age-related nuclear cataract, bilateral: Secondary | ICD-10-CM | POA: Diagnosis not present

## 2022-06-26 DIAGNOSIS — R32 Unspecified urinary incontinence: Secondary | ICD-10-CM | POA: Diagnosis not present

## 2022-06-27 ENCOUNTER — Other Ambulatory Visit: Payer: Self-pay | Admitting: Family

## 2022-07-05 ENCOUNTER — Ambulatory Visit: Payer: Medicare PPO | Admitting: Internal Medicine

## 2022-07-05 ENCOUNTER — Encounter: Payer: Self-pay | Admitting: Internal Medicine

## 2022-07-05 VITALS — BP 128/78 | HR 60 | Temp 98.0°F | Resp 16 | Ht 64.0 in | Wt 176.4 lb

## 2022-07-05 DIAGNOSIS — R945 Abnormal results of liver function studies: Secondary | ICD-10-CM | POA: Diagnosis not present

## 2022-07-05 DIAGNOSIS — Z0000 Encounter for general adult medical examination without abnormal findings: Secondary | ICD-10-CM | POA: Diagnosis not present

## 2022-07-05 DIAGNOSIS — I10 Essential (primary) hypertension: Secondary | ICD-10-CM | POA: Diagnosis not present

## 2022-07-05 DIAGNOSIS — N1831 Chronic kidney disease, stage 3a: Secondary | ICD-10-CM

## 2022-07-05 DIAGNOSIS — Z1211 Encounter for screening for malignant neoplasm of colon: Secondary | ICD-10-CM

## 2022-07-05 DIAGNOSIS — R739 Hyperglycemia, unspecified: Secondary | ICD-10-CM

## 2022-07-05 DIAGNOSIS — Z87440 Personal history of urinary (tract) infections: Secondary | ICD-10-CM

## 2022-07-05 DIAGNOSIS — F429 Obsessive-compulsive disorder, unspecified: Secondary | ICD-10-CM

## 2022-07-05 DIAGNOSIS — E7800 Pure hypercholesterolemia, unspecified: Secondary | ICD-10-CM | POA: Diagnosis not present

## 2022-07-05 LAB — BASIC METABOLIC PANEL
BUN: 21 mg/dL (ref 6–23)
CO2: 29 mEq/L (ref 19–32)
Calcium: 9.8 mg/dL (ref 8.4–10.5)
Chloride: 103 mEq/L (ref 96–112)
Creatinine, Ser: 0.96 mg/dL (ref 0.40–1.20)
GFR: 56.43 mL/min — ABNORMAL LOW (ref >60.00)
Glucose, Bld: 90 mg/dL (ref 70–99)
Potassium: 3.8 mEq/L (ref 3.5–5.1)
Sodium: 140 mEq/L (ref 135–145)

## 2022-07-05 LAB — HEPATIC FUNCTION PANEL
ALT: 24 U/L (ref 0–35)
AST: 27 U/L (ref 0–37)
Albumin: 4.3 g/dL (ref 3.5–5.2)
Alkaline Phosphatase: 55 U/L (ref 39–117)
Bilirubin, Direct: 0.2 mg/dL (ref 0.0–0.3)
Total Bilirubin: 0.9 mg/dL (ref 0.2–1.2)
Total Protein: 7 g/dL (ref 6.0–8.3)

## 2022-07-05 LAB — CBC WITH DIFFERENTIAL/PLATELET
Basophils Absolute: 0.1 10*3/uL (ref 0.0–0.1)
Basophils Relative: 1 % (ref 0.0–3.0)
Eosinophils Absolute: 0.1 10*3/uL (ref 0.0–0.7)
Eosinophils Relative: 2.3 % (ref 0.0–5.0)
HCT: 38.3 % (ref 36.0–46.0)
Hemoglobin: 13 g/dL (ref 12.0–15.0)
Lymphocytes Relative: 22.1 % (ref 12.0–46.0)
Lymphs Abs: 1.2 10*3/uL (ref 0.7–4.0)
MCHC: 33.9 g/dL (ref 30.0–36.0)
MCV: 86.4 fl (ref 78.0–100.0)
Monocytes Absolute: 0.3 10*3/uL (ref 0.1–1.0)
Monocytes Relative: 5.8 % (ref 3.0–12.0)
Neutro Abs: 3.8 10*3/uL (ref 1.4–7.7)
Neutrophils Relative %: 68.8 % (ref 43.0–77.0)
Platelets: 175 10*3/uL (ref 150.0–400.0)
RBC: 4.44 Mil/uL (ref 3.87–5.11)
RDW: 14.7 % (ref 11.5–15.5)
WBC: 5.5 10*3/uL (ref 4.0–10.5)

## 2022-07-05 LAB — LIPID PANEL
Cholesterol: 121 mg/dL (ref 0–200)
HDL: 44.7 mg/dL (ref >39.00)
LDL Cholesterol: 45 mg/dL (ref 0–99)
NonHDL: 75.91
Total CHOL/HDL Ratio: 3
Triglycerides: 157 mg/dL — ABNORMAL HIGH (ref 0.0–149.0)
VLDL: 31.4 mg/dL (ref 0.0–40.0)

## 2022-07-05 LAB — HEMOGLOBIN A1C: Hgb A1c MFr Bld: 6.1 % (ref 4.6–6.5)

## 2022-07-05 LAB — TSH: TSH: 3.76 u[IU]/mL (ref 0.35–5.50)

## 2022-07-05 MED ORDER — SERTRALINE HCL 50 MG PO TABS: 75.0000 mg | ORAL_TABLET | Freq: Every day | ORAL | 3 refills | Status: DC

## 2022-07-05 MED ORDER — CEPHALEXIN 250 MG PO CAPS
ORAL_CAPSULE | ORAL | 2 refills | Status: DC
Start: 2022-07-05 — End: 2022-12-08

## 2022-07-05 MED ORDER — ROSUVASTATIN CALCIUM 20 MG PO TABS: 20.0000 mg | ORAL_TABLET | Freq: Every day | ORAL | 2 refills | Status: DC

## 2022-07-05 MED ORDER — BISOPROLOL FUMARATE 5 MG PO TABS
5.0000 mg | ORAL_TABLET | Freq: Every day | ORAL | 2 refills | Status: DC
Start: 2022-07-05 — End: 2022-12-08

## 2022-07-05 MED ORDER — OLMESARTAN MEDOXOMIL 40 MG PO TABS: 40.0000 mg | ORAL_TABLET | Freq: Every day | ORAL | 1 refills | Status: DC

## 2022-07-05 MED ORDER — OMEPRAZOLE 20 MG PO CPDR: 20.0000 mg | DELAYED_RELEASE_CAPSULE | Freq: Two times a day (BID) | ORAL | 1 refills | Status: DC

## 2022-07-05 NOTE — Progress Notes (Signed)
Subjective:    Patient ID: Lindsey Harmon, female    DOB: Mar 21, 1946, 77 y.o.   MRN: [id number]  Patient here for  Chief Complaint  Patient presents with   Annual Exam    HPI Here for physical exam.  She reports she is doing relatively well.  Keeping - 44 month old.  Staying active.  No chest pain or sob reported.  No abdominal pain.  Bowels moving.  Had a cold starting week after Christmas.  She feels she is back to her normal.  No significant cough.  Handling stress.  Overall feels she is doing well.    Past Medical History:  Diagnosis Date   Abnormal liver function    Cystocele, unspecified (CODE)    Fibrocystic breast disease    GERD (gastroesophageal reflux disease)    Hypertension    Intrinsic sphincter deficiency    Pelvic relaxation    Pure hypercholesterolemia    Rectocele    SUI (stress urinary incontinence, female)    Urinary retention    Urinary retention    Past Surgical History:  Procedure Laterality Date   ABDOMINAL HYSTERECTOMY     APPENDECTOMY     Bladder Tack     burch urethropexy  10/24/2000   laparoscopic colpopexy/culdoplasty   COLONOSCOPY WITH PROPOFOL N/A 08/10/2016   Procedure: COLONOSCOPY WITH PROPOFOL;  Surgeon: Lollie Sails, MD;  Location: [facility] ENDOSCOPY;  Service: Endoscopy;  Laterality: N/A;   COLONOSCOPY WITH PROPOFOL N/A 10/20/2016   Procedure: COLONOSCOPY WITH PROPOFOL;  Surgeon: Robert Bellow, MD;  Location: [facility] ENDOSCOPY;  Service: Endoscopy;  Laterality: N/A;   COLONOSCOPY WITH PROPOFOL N/A 06/01/2017   Procedure: COLONOSCOPY WITH PROPOFOL;  Surgeon: Robert Bellow, MD;  Location: [facility] ENDOSCOPY;  Service: Endoscopy;  Laterality: N/A;   COLONOSCOPY WITH PROPOFOL N/A 12/23/2021   Procedure: COLONOSCOPY WITH PROPOFOL;  Surgeon: Robert Bellow, MD;  Location: [facility] ENDOSCOPY;  Service: Endoscopy;  Laterality: N/A;   COLPORRHAPHY     forv repair cystocele anterior   TRANSVAGINAL TAPE (TVT) REMOVAL     Family History  Problem  Relation Age of Onset   Pneumonia Mother        died of presumed aspiration pneumonia   Hypertension Mother    Cancer Father        prostate   Heart disease Paternal Grandfather        myocardial infarction   Alzheimer's disease Brother    Social History   Socioeconomic History   Marital status: Married    Spouse name: Not on file   Number of children: Not on file   Years of education: Not on file   Highest education level: Not on file  Occupational History   Not on file  Tobacco Use   Smoking status: Never   Smokeless tobacco: Never  Vaping Use   Vaping Use: Never used  Substance and Sexual Activity   Alcohol use: No    Alcohol/week: 0.0 standard drinks of alcohol   Drug use: No   Sexual activity: Not on file  Other Topics Concern   Not on file  Social History Narrative   Not on file   Social Determinants of Health   Financial Resource Strain: Low Risk  (02/17/2022)   Overall Financial Resource Strain (CARDIA)    Difficulty of Paying Living Expenses: Not hard at all  Food Insecurity: No Food Insecurity (02/17/2022)   Hunger Vital Sign    Worried About Running Out of Food in the  Last Year: Never true    [place] in the Last Year: Never true  Transportation Needs: No Transportation Needs (02/17/2022)   PRAPARE - Hydrologist (Medical): No    Lack of Transportation (Non-Medical): No  Physical Activity: Insufficiently Active (02/17/2022)   Exercise Vital Sign    Days of Exercise per Week: 7 days    Minutes of Exercise per Session: 10 min  Stress: No Stress Concern Present (02/17/2022)   [place]    Feeling of Stress : Not at all  Social Connections: [place] (02/17/2022)   Social Connection and Isolation Panel [NHANES]    Frequency of Communication with Friends and Family: More than three times a week    Frequency of Social Gatherings with Friends and  Family: More than three times a week    Attends Religious Services: More than 4 times per year    Active Member of [employer] or Organizations: Yes    Attends Archivist Meetings: Not on file    Marital Status: Married     Review of Systems  Constitutional:  Negative for appetite change and unexpected weight change.  HENT:  Negative for congestion, sinus pressure and sore throat.   Eyes:  Negative for pain and visual disturbance.  Respiratory:  Negative for cough, chest tightness and shortness of breath.   Cardiovascular:  Negative for chest pain and palpitations.  Gastrointestinal:  Negative for abdominal pain, diarrhea, nausea and vomiting.  Genitourinary:  Negative for difficulty urinating and dysuria.  Musculoskeletal:  Negative for joint swelling and myalgias.  Skin:  Negative for color change and rash.  Neurological:  Negative for dizziness and headaches.  Hematological:  Negative for adenopathy. Does not bruise/bleed easily.  Psychiatric/Behavioral:  Negative for agitation and dysphoric mood.        Objective:     BP 128/78   Pulse 60   Temp 98 F (36.7 C)   Resp 16   Ht '5\' 4"'$  (1.626 m)   Wt 176 lb 6.4 oz (80 kg)   SpO2 97%   BMI 30.28 kg/m  Wt Readings from Last 3 Encounters:  07/05/22 176 lb 6.4 oz (80 kg)  03/02/22 179 lb (81.2 kg)  02/17/22 175 lb (79.4 kg)    Physical Exam Vitals reviewed.  Constitutional:      General: She is not in acute distress.    Appearance: Normal appearance. She is well-developed.  HENT:     Head: Normocephalic and atraumatic.     Right Ear: External ear normal.     Left Ear: External ear normal.  Eyes:     General: No scleral icterus.       Right eye: No discharge.        Left eye: No discharge.     Conjunctiva/sclera: Conjunctivae normal.  Neck:     Thyroid: No thyromegaly.  Cardiovascular:     Rate and Rhythm: Normal rate and regular rhythm.  Pulmonary:     Effort: No tachypnea, accessory muscle usage or  respiratory distress.     Breath sounds: Normal breath sounds. No decreased breath sounds or wheezing.  Chest:  Breasts:    Right: No inverted nipple, mass, nipple discharge or tenderness (no axillary adenopathy).     Left: No inverted nipple, mass, nipple discharge or tenderness (no axilarry adenopathy).  Abdominal:     General: Bowel sounds are normal.     Palpations: Abdomen  is soft.     Tenderness: There is no abdominal tenderness.  Musculoskeletal:        General: No swelling or tenderness.     Cervical back: Neck supple. No tenderness.  Lymphadenopathy:     Cervical: No cervical adenopathy.  Skin:    Findings: No erythema or rash.  Neurological:     Mental Status: She is alert and oriented to person, place, and time.  Psychiatric:        Mood and Affect: Mood normal.        Behavior: Behavior normal.      Outpatient Encounter Medications as of 07/05/2022  Medication Sig   bisoprolol (ZEBETA) 5 MG tablet Take 1 tablet (5 mg total) by mouth daily.   Calcium Carbonate-Vitamin D (CALTRATE 600+D PO) Take by mouth.   cephALEXin (KEFLEX) 250 MG capsule TAKE 1 CAPSULE BY MOUTH EVERY DAY   hydrochlorothiazide (MICROZIDE) 12.5 MG capsule TAKE 1 CAPSULE BY MOUTH EVERY DAY   olmesartan (BENICAR) 40 MG tablet Take 1 tablet (40 mg total) by mouth daily.   Omega-3 Fatty Acids (FISH OIL) 1200 MG CAPS Take by mouth daily.   omeprazole (PRILOSEC) 20 MG capsule Take 1 capsule (20 mg total) by mouth 2 (two) times daily.   rosuvastatin (CRESTOR) 20 MG tablet Take 1 tablet (20 mg total) by mouth daily.   sertraline (ZOLOFT) 50 MG tablet Take 1.5 tablets (75 mg total) by mouth daily.   [DISCONTINUED] bisoprolol (ZEBETA) 5 MG tablet Take 1 tablet (5 mg total) by mouth daily.   [DISCONTINUED] cephALEXin (KEFLEX) 250 MG capsule TAKE 1 CAPSULE BY MOUTH EVERY DAY   [DISCONTINUED] olmesartan (BENICAR) 40 MG tablet TAKE 1 TABLET BY MOUTH EVERY DAY   [DISCONTINUED] omeprazole (PRILOSEC) 20 MG capsule  TAKE 1 CAPSULE BY MOUTH TWICE A DAY   [DISCONTINUED] rosuvastatin (CRESTOR) 20 MG tablet Take 1 tablet (20 mg total) by mouth daily.   [DISCONTINUED] sertraline (ZOLOFT) 50 MG tablet Take 1.5 tablets (75 mg total) by mouth daily.   No facility-administered encounter medications on file as of 07/05/2022.     Lab Results  Component Value Date   WBC 5.5 07/05/2022   HGB 13.0 07/05/2022   HCT 38.3 07/05/2022   PLT 175.0 07/05/2022   GLUCOSE 90 07/05/2022   CHOL 121 07/05/2022   TRIG 157.0 (H) 07/05/2022   HDL 44.70 07/05/2022   LDLDIRECT 83.0 05/30/2018   LDLCALC 45 07/05/2022   ALT 24 07/05/2022   AST 27 07/05/2022   NA 140 07/05/2022   K 3.8 07/05/2022   CL 103 07/05/2022   CREATININE 0.96 07/05/2022   BUN 21 07/05/2022   CO2 29 07/05/2022   TSH 3.76 07/05/2022   HGBA1C 6.1 07/05/2022    No results found.     Assessment & Plan:  Routine general medical examination at a health care facility  Health care maintenance Assessment & Plan: Physical today 07/05/22.   Mammogram 10/23/21 - Birads I.  Colonoscopy - Three 6 mm polyps in the sigmoid colon and in the descending colon. Pathology: COLON POLYPS X 2, DESCENDING; COLD BIOPSY:  FRAGMENTS (X 3) OF TUBULAR ADENOMAS.  NEGATIVE FOR HIGH-GRADE DYSPLASIA AND MALIGNANCY.  COLON POLYP, SIGMOID; COLD BIOPSY:  TUBULAR ADENOMA.  NEGATIVE FOR HIGH-GRADE DYSPLASIA AND MALIGNANCY.   Hypercholesterolemia Assessment & Plan: On crestor '20mg'$  q day.  Low cholesterol diet and exercise.  Follow lipid panel and liver function tests.    Orders: -     CBC with Differential/Platelet -  Basic metabolic panel -     Hepatic function panel -     Lipid panel -     TSH  Hyperglycemia Assessment & Plan: Low carb diet and exercise.  Follow met b and a1c.   Orders: -     Hemoglobin A1c  Abnormal liver function Assessment & Plan: Continue diet and exercise. Follow liver panel.    Stage 3a chronic kidney disease (HCC) Assessment &  Plan: Avoid antiinflammatories.  Stay hydrated.  Follow metabolic panel.  Continue benicar.    Colon cancer screening Assessment & Plan: Colonoscopy 12/2021 - Three 6 mm polyps in the sigmoid and descending colon.     Essential hypertension, benign Assessment & Plan: Blood pressure doing well.  On benicar.  Follow pressures.  Follow metabolic panel.    History of frequent urinary tract infections Assessment & Plan: Continues daily keflex.  Self caths.  Stable.  Follow.    Obsessive-compulsive disorder, unspecified type Assessment & Plan: Continue zoloft.  Stable.    Other orders -     Bisoprolol Fumarate; Take 1 tablet (5 mg total) by mouth daily.  Dispense: 90 tablet; Refill: 2 -     Cephalexin; TAKE 1 CAPSULE BY MOUTH EVERY DAY  Dispense: 90 capsule; Refill: 2 -     Olmesartan Medoxomil; Take 1 tablet (40 mg total) by mouth daily.  Dispense: 90 tablet; Refill: 1 -     Omeprazole; Take 1 capsule (20 mg total) by mouth 2 (two) times daily.  Dispense: 180 capsule; Refill: 1 -     Rosuvastatin Calcium; Take 1 tablet (20 mg total) by mouth daily.  Dispense: 90 tablet; Refill: 2 -     Sertraline HCl; Take 1.5 tablets (75 mg total) by mouth daily.  Dispense: 135 tablet; Refill: 3     Einar Pheasant, MD

## 2022-07-05 NOTE — Assessment & Plan Note (Signed)
Physical today 07/05/22.   Mammogram 10/23/21 - Birads I.  Colonoscopy - Three 6 mm polyps in the sigmoid colon and in the descending colon. Pathology: COLON POLYPS X 2, DESCENDING; COLD BIOPSY:  FRAGMENTS (X 3) OF TUBULAR ADENOMAS.  NEGATIVE FOR HIGH-GRADE DYSPLASIA AND MALIGNANCY.  COLON POLYP, SIGMOID; COLD BIOPSY:  TUBULAR ADENOMA.  NEGATIVE FOR HIGH-GRADE DYSPLASIA AND MALIGNANCY.

## 2022-07-12 ENCOUNTER — Encounter: Payer: Self-pay | Admitting: Internal Medicine

## 2022-07-12 NOTE — Assessment & Plan Note (Signed)
Blood pressure doing well.  On benicar.  Follow pressures.  Follow metabolic panel.

## 2022-07-12 NOTE — Assessment & Plan Note (Signed)
Continues daily keflex.  Self caths.  Stable.  Follow.

## 2022-07-12 NOTE — Assessment & Plan Note (Signed)
Colonoscopy 12/2021 - Three 6 mm polyps in the sigmoid and descending colon.

## 2022-07-12 NOTE — Assessment & Plan Note (Signed)
Low carb diet and exercise.  Follow met b and a1c.   

## 2022-07-12 NOTE — Assessment & Plan Note (Signed)
Continue zoloft.  Stable.

## 2022-07-12 NOTE — Assessment & Plan Note (Signed)
Avoid antiinflammatories.  Stay hydrated.  Follow metabolic panel.  Continue benicar.

## 2022-07-12 NOTE — Assessment & Plan Note (Signed)
Continue diet and exercise. Follow liver panel.

## 2022-07-12 NOTE — Assessment & Plan Note (Signed)
On crestor '20mg'$  q day.  Low cholesterol diet and exercise.  Follow lipid panel and liver function tests.

## 2022-08-01 ENCOUNTER — Other Ambulatory Visit: Payer: Self-pay | Admitting: Internal Medicine

## 2022-09-09 ENCOUNTER — Other Ambulatory Visit: Payer: Self-pay | Admitting: Internal Medicine

## 2022-09-09 DIAGNOSIS — Z1231 Encounter for screening mammogram for malignant neoplasm of breast: Secondary | ICD-10-CM

## 2022-09-10 DIAGNOSIS — L089 Local infection of the skin and subcutaneous tissue, unspecified: Secondary | ICD-10-CM | POA: Diagnosis not present

## 2022-09-10 DIAGNOSIS — D225 Melanocytic nevi of trunk: Secondary | ICD-10-CM | POA: Diagnosis not present

## 2022-09-10 DIAGNOSIS — L820 Inflamed seborrheic keratosis: Secondary | ICD-10-CM | POA: Diagnosis not present

## 2022-09-10 DIAGNOSIS — D2261 Melanocytic nevi of right upper limb, including shoulder: Secondary | ICD-10-CM | POA: Diagnosis not present

## 2022-09-10 DIAGNOSIS — L821 Other seborrheic keratosis: Secondary | ICD-10-CM | POA: Diagnosis not present

## 2022-09-10 DIAGNOSIS — D2262 Melanocytic nevi of left upper limb, including shoulder: Secondary | ICD-10-CM | POA: Diagnosis not present

## 2022-09-10 DIAGNOSIS — D2272 Melanocytic nevi of left lower limb, including hip: Secondary | ICD-10-CM | POA: Diagnosis not present

## 2022-09-10 DIAGNOSIS — D2271 Melanocytic nevi of right lower limb, including hip: Secondary | ICD-10-CM | POA: Diagnosis not present

## 2022-09-10 DIAGNOSIS — D485 Neoplasm of uncertain behavior of skin: Secondary | ICD-10-CM | POA: Diagnosis not present

## 2022-09-19 DIAGNOSIS — R32 Unspecified urinary incontinence: Secondary | ICD-10-CM | POA: Diagnosis not present

## 2022-10-26 ENCOUNTER — Ambulatory Visit
Admission: RE | Admit: 2022-10-26 | Discharge: 2022-10-26 | Disposition: A | Discharge status: Home / Self Care | Payer: Medicare PPO | Source: Ambulatory Visit | Attending: Internal Medicine | Admitting: Internal Medicine

## 2022-10-26 DIAGNOSIS — Z1231 Encounter for screening mammogram for malignant neoplasm of breast: Secondary | ICD-10-CM | POA: Insufficient documentation

## 2022-11-04 ENCOUNTER — Ambulatory Visit: Payer: Medicare PPO | Admitting: Internal Medicine

## 2022-12-08 ENCOUNTER — Encounter: Payer: Self-pay | Admitting: Internal Medicine

## 2022-12-08 ENCOUNTER — Ambulatory Visit (INDEPENDENT_AMBULATORY_CARE_PROVIDER_SITE_OTHER): Payer: Medicare PPO

## 2022-12-08 ENCOUNTER — Ambulatory Visit: Payer: Medicare PPO | Admitting: Internal Medicine

## 2022-12-08 VITALS — BP 132/70 | HR 74 | Temp 97.9°F | Resp 16 | Ht 64.0 in | Wt 175.6 lb

## 2022-12-08 DIAGNOSIS — F429 Obsessive-compulsive disorder, unspecified: Secondary | ICD-10-CM | POA: Diagnosis not present

## 2022-12-08 DIAGNOSIS — R739 Hyperglycemia, unspecified: Secondary | ICD-10-CM

## 2022-12-08 DIAGNOSIS — R945 Abnormal results of liver function studies: Secondary | ICD-10-CM | POA: Diagnosis not present

## 2022-12-08 DIAGNOSIS — M25561 Pain in right knee: Secondary | ICD-10-CM | POA: Insufficient documentation

## 2022-12-08 DIAGNOSIS — N1831 Chronic kidney disease, stage 3a: Secondary | ICD-10-CM

## 2022-12-08 DIAGNOSIS — Z789 Other specified health status: Secondary | ICD-10-CM

## 2022-12-08 DIAGNOSIS — Z87440 Personal history of urinary (tract) infections: Secondary | ICD-10-CM | POA: Diagnosis not present

## 2022-12-08 DIAGNOSIS — I10 Essential (primary) hypertension: Secondary | ICD-10-CM

## 2022-12-08 DIAGNOSIS — M1711 Unilateral primary osteoarthritis, right knee: Secondary | ICD-10-CM | POA: Diagnosis not present

## 2022-12-08 DIAGNOSIS — Z1211 Encounter for screening for malignant neoplasm of colon: Secondary | ICD-10-CM | POA: Diagnosis not present

## 2022-12-08 DIAGNOSIS — E7800 Pure hypercholesterolemia, unspecified: Secondary | ICD-10-CM

## 2022-12-08 LAB — BASIC METABOLIC PANEL
BUN: 21 mg/dL (ref 6–23)
CO2: 28 mEq/L (ref 19–32)
Calcium: 10.2 mg/dL (ref 8.4–10.5)
Chloride: 101 mEq/L (ref 96–112)
Creatinine, Ser: 0.94 mg/dL (ref 0.40–1.20)
GFR: 57.7 mL/min — ABNORMAL LOW (ref >60.00)
Glucose, Bld: 93 mg/dL (ref 70–99)
Potassium: 3.8 mEq/L (ref 3.5–5.1)
Sodium: 139 mEq/L (ref 135–145)

## 2022-12-08 LAB — LIPID PANEL
Cholesterol: 131 mg/dL (ref 0–200)
HDL: 44.5 mg/dL (ref >39.00)
LDL Cholesterol: 49 mg/dL (ref 0–99)
NonHDL: 86.7
Total CHOL/HDL Ratio: 3
Triglycerides: 190 mg/dL — ABNORMAL HIGH (ref 0.0–149.0)
VLDL: 38 mg/dL (ref 0.0–40.0)

## 2022-12-08 LAB — HEMOGLOBIN A1C: Hgb A1c MFr Bld: 6.1 % (ref 4.6–6.5)

## 2022-12-08 LAB — HEPATIC FUNCTION PANEL
ALT: 21 U/L (ref 0–35)
AST: 30 U/L (ref 0–37)
Albumin: 4.6 g/dL (ref 3.5–5.2)
Alkaline Phosphatase: 58 U/L (ref 39–117)
Bilirubin, Direct: 0.2 mg/dL (ref 0.0–0.3)
Total Bilirubin: 1 mg/dL (ref 0.2–1.2)
Total Protein: 7.2 g/dL (ref 6.0–8.3)

## 2022-12-08 MED ORDER — BISOPROLOL FUMARATE 5 MG PO TABS: 5.0000 mg | ORAL_TABLET | Freq: Every day | ORAL | 3 refills | Status: DC

## 2022-12-08 MED ORDER — ROSUVASTATIN CALCIUM 20 MG PO TABS: 20.0000 mg | ORAL_TABLET | Freq: Every day | ORAL | 3 refills | Status: DC

## 2022-12-08 MED ORDER — HYDROCHLOROTHIAZIDE 12.5 MG PO CAPS: 12.5000 mg | ORAL_CAPSULE | Freq: Every day | ORAL | 3 refills | Status: DC

## 2022-12-08 MED ORDER — CEPHALEXIN 250 MG PO CAPS: ORAL_CAPSULE | ORAL | 3 refills | Status: DC

## 2022-12-08 MED ORDER — SERTRALINE HCL 50 MG PO TABS: 75.0000 mg | ORAL_TABLET | Freq: Every day | ORAL | 1 refills | Status: DC

## 2022-12-08 MED ORDER — OMEPRAZOLE 20 MG PO CPDR: 20.0000 mg | DELAYED_RELEASE_CAPSULE | Freq: Two times a day (BID) | ORAL | 3 refills | Status: DC

## 2022-12-08 MED ORDER — OLMESARTAN MEDOXOMIL 40 MG PO TABS: 40.0000 mg | ORAL_TABLET | Freq: Every day | ORAL | 3 refills | Status: DC

## 2022-12-08 NOTE — Assessment & Plan Note (Signed)
Continue diet and exercise. Follow liver panel.  

## 2022-12-08 NOTE — Assessment & Plan Note (Signed)
Blood pressure doing well.  On benicar.  Follow pressures.  Follow metabolic panel.  

## 2022-12-08 NOTE — Progress Notes (Signed)
Subjective:    Patient ID: Lindsey Harmon, female    DOB: Nov 04, 1945, 77 y.o.   MRN: [id number]  Patient here for  Chief Complaint  Patient presents with   Medical Management of Chronic Issues    HPI Here for f/u - f/u regarding hypercholesterolemia and hypertension. Reports she is doing relatively well.  Does report increased pain - right knee and pain up into right thigh.  Also pain - right lower back.  Limits activity.  Has been present for months.  Worsened recently.  If she goes from sitting to standing - has to stand a while before moving.  Hurts to turn over in bed.  No abdominal pain or bowel change reported.  Some trouble falling assleep    Past Medical History:  Diagnosis Date   Abnormal liver function    Cystocele, unspecified (CODE)    Fibrocystic breast disease    GERD (gastroesophageal reflux disease)    Hypertension    Intrinsic sphincter deficiency    Pelvic relaxation    Pure hypercholesterolemia    Rectocele    SUI (stress urinary incontinence, female)    Urinary retention    Urinary retention    Past Surgical History:  Procedure Laterality Date   ABDOMINAL HYSTERECTOMY     APPENDECTOMY     Bladder Tack     burch urethropexy  10/24/2000   laparoscopic colpopexy/culdoplasty   COLONOSCOPY WITH PROPOFOL N/A 08/10/2016   Procedure: COLONOSCOPY WITH PROPOFOL;  Surgeon: Christena Deem, MD;  Location: [facility] ENDOSCOPY;  Service: Endoscopy;  Laterality: N/A;   COLONOSCOPY WITH PROPOFOL N/A 10/20/2016   Procedure: COLONOSCOPY WITH PROPOFOL;  Surgeon: Earline Mayotte, MD;  Location: [facility] ENDOSCOPY;  Service: Endoscopy;  Laterality: N/A;   COLONOSCOPY WITH PROPOFOL N/A 06/01/2017   Procedure: COLONOSCOPY WITH PROPOFOL;  Surgeon: Earline Mayotte, MD;  Location: [facility] ENDOSCOPY;  Service: Endoscopy;  Laterality: N/A;   COLONOSCOPY WITH PROPOFOL N/A 12/23/2021   Procedure: COLONOSCOPY WITH PROPOFOL;  Surgeon: Earline Mayotte, MD;  Location: [facility] ENDOSCOPY;  Service:  Endoscopy;  Laterality: N/A;   COLPORRHAPHY     forv repair cystocele anterior   TRANSVAGINAL TAPE (TVT) REMOVAL     Family History  Problem Relation Age of Onset   Pneumonia Mother        died of presumed aspiration pneumonia   Hypertension Mother    Cancer Father        prostate   Heart disease Paternal Grandfather        myocardial infarction   Alzheimer's disease Brother    Breast cancer Neg Hx    Social History   Socioeconomic History   Marital status: Married    Spouse name: Not on file   Number of children: Not on file   Years of education: Not on file   Highest education level: 12th grade  Occupational History   Not on file  Tobacco Use   Smoking status: Never   Smokeless tobacco: Never  Vaping Use   Vaping status: Never Used  Substance and Sexual Activity   Alcohol use: No    Alcohol/week: 0.0 standard drinks of alcohol   Drug use: No   Sexual activity: Not on file  Other Topics Concern   Not on file  Social History Narrative   Not on file   Social Determinants of Health   Financial Resource Strain: Low Risk  (12/07/2022)   Overall Financial Resource Strain (CARDIA)    Difficulty of Paying Living Expenses:  Not hard at all  Food Insecurity: No Food Insecurity (12/07/2022)   Hunger Vital Sign    Worried About Running Out of Food in the Last Year: Never true    Ran Out of Food in the Last Year: Never true  Transportation Needs: No Transportation Needs (12/07/2022)   PRAPARE - Administrator, Civil Service (Medical): No    Lack of Transportation (Non-Medical): No  Physical Activity: Unknown (12/07/2022)   Exercise Vital Sign    Days of Exercise per Week: Patient declined    Minutes of Exercise per Session: 10 min  Stress: Stress Concern Present (12/07/2022)   [employer] of Occupational Health - Occupational Stress Questionnaire    Feeling of Stress : To some extent  Social Connections: Moderately Integrated (12/07/2022)   Social  Connection and Isolation Panel [NHANES]    Frequency of Communication with Friends and Family: Twice a week    Frequency of Social Gatherings with Friends and Family: Twice a week    Attends Religious Services: More than 4 times per year    Active Member of [employer] or Organizations: No    Attends Engineer, structural: Not on file    Marital Status: Married     Review of Systems  Constitutional:  Negative for appetite change and unexpected weight change.  HENT:  Negative for congestion and sinus pressure.   Respiratory:  Negative for cough, chest tightness and shortness of breath.   Cardiovascular:  Negative for chest pain, palpitations and leg swelling.  Gastrointestinal:  Negative for abdominal pain, diarrhea, nausea and vomiting.  Genitourinary:  Negative for difficulty urinating and dysuria.  Musculoskeletal:  Negative for myalgias.       Increased pain - right knee.  Pain - right upper thigh and right lower back.   Skin:  Negative for color change and rash.  Neurological:  Negative for dizziness and headaches.       Objective:     BP 132/70   Pulse 74   Temp 97.9 F (36.6 C)   Resp 16   Ht 5\' 4"  (1.626 m)   Wt 175 lb 9.6 oz (79.7 kg)   SpO2 98%   BMI 30.14 kg/m  Wt Readings from Last 3 Encounters:  12/08/22 175 lb 9.6 oz (79.7 kg)  07/05/22 176 lb 6.4 oz (80 kg)  03/02/22 179 lb (81.2 kg)    Physical Exam Vitals reviewed.  Constitutional:      General: She is not in acute distress.    Appearance: Normal appearance.  HENT:     Head: Normocephalic and atraumatic.     Right Ear: External ear normal.     Left Ear: External ear normal.  Eyes:     General: No scleral icterus.       Right eye: No discharge.        Left eye: No discharge.     Conjunctiva/sclera: Conjunctivae normal.  Neck:     Thyroid: No thyromegaly.  Cardiovascular:     Rate and Rhythm: Normal rate and regular rhythm.  Pulmonary:     Effort: No respiratory distress.     Breath  sounds: Normal breath sounds. No wheezing.  Abdominal:     General: Bowel sounds are normal.     Palpations: Abdomen is soft.     Tenderness: There is no abdominal tenderness.  Musculoskeletal:        General: No swelling.     Cervical back: Neck supple. No tenderness.  Comments: Increased pain - right knee - weight bearing.  Increased pain - right knee - with resistance against flexion and extension.   Lymphadenopathy:     Cervical: No cervical adenopathy.  Skin:    Findings: No erythema or rash.  Neurological:     Mental Status: She is alert.  Psychiatric:        Mood and Affect: Mood normal.        Behavior: Behavior normal.      Outpatient Encounter Medications as of 12/08/2022  Medication Sig   bisoprolol (ZEBETA) 5 MG tablet Take 1 tablet (5 mg total) by mouth daily.   Calcium Carbonate-Vitamin D (CALTRATE 600+D PO) Take by mouth.   cephALEXin (KEFLEX) 250 MG capsule TAKE 1 CAPSULE BY MOUTH EVERY DAY   hydrochlorothiazide (MICROZIDE) 12.5 MG capsule Take 1 capsule (12.5 mg total) by mouth daily.   olmesartan (BENICAR) 40 MG tablet Take 1 tablet (40 mg total) by mouth daily.   Omega-3 Fatty Acids (FISH OIL) 1200 MG CAPS Take by mouth daily.   omeprazole (PRILOSEC) 20 MG capsule Take 1 capsule (20 mg total) by mouth 2 (two) times daily.   rosuvastatin (CRESTOR) 20 MG tablet Take 1 tablet (20 mg total) by mouth daily.   sertraline (ZOLOFT) 50 MG tablet Take 1.5 tablets (75 mg total) by mouth daily.   [DISCONTINUED] bisoprolol (ZEBETA) 5 MG tablet Take 1 tablet (5 mg total) by mouth daily.   [DISCONTINUED] cephALEXin (KEFLEX) 250 MG capsule TAKE 1 CAPSULE BY MOUTH EVERY DAY   [DISCONTINUED] hydrochlorothiazide (MICROZIDE) 12.5 MG capsule TAKE 1 CAPSULE BY MOUTH EVERY DAY   [DISCONTINUED] olmesartan (BENICAR) 40 MG tablet Take 1 tablet (40 mg total) by mouth daily.   [DISCONTINUED] omeprazole (PRILOSEC) 20 MG capsule Take 1 capsule (20 mg total) by mouth 2 (two) times daily.    [DISCONTINUED] rosuvastatin (CRESTOR) 20 MG tablet Take 1 tablet (20 mg total) by mouth daily.   [DISCONTINUED] sertraline (ZOLOFT) 50 MG tablet Take 1.5 tablets (75 mg total) by mouth daily.   No facility-administered encounter medications on file as of 12/08/2022.     Lab Results  Component Value Date   WBC 5.5 07/05/2022   HGB 13.0 07/05/2022   HCT 38.3 07/05/2022   PLT 175.0 07/05/2022   GLUCOSE 93 12/08/2022   CHOL 131 12/08/2022   TRIG 190.0 (H) 12/08/2022   HDL 44.50 12/08/2022   LDLDIRECT 83.0 05/30/2018   LDLCALC 49 12/08/2022   ALT 21 12/08/2022   AST 30 12/08/2022   NA 139 12/08/2022   K 3.8 12/08/2022   CL 101 12/08/2022   CREATININE 0.94 12/08/2022   BUN 21 12/08/2022   CO2 28 12/08/2022   TSH 3.76 07/05/2022   HGBA1C 6.1 12/08/2022    MM 3D SCREENING MAMMOGRAM BILATERAL BREAST  Result Date: 10/27/2022 CLINICAL DATA:  Screening. EXAM: DIGITAL SCREENING BILATERAL MAMMOGRAM WITH TOMOSYNTHESIS AND CAD TECHNIQUE: Bilateral screening digital craniocaudal and mediolateral oblique mammograms were obtained. Bilateral screening digital breast tomosynthesis was performed. The images were evaluated with computer-aided detection. COMPARISON:  Previous exam(s). ACR Breast Density Category a: The breasts are almost entirely fatty. FINDINGS: There are no findings suspicious for malignancy. IMPRESSION: No mammographic evidence of malignancy. A result letter of this screening mammogram will be mailed directly to the patient. RECOMMENDATION: Screening mammogram in one year. (Code:SM-B-01Y) BI-RADS CATEGORY  1: Negative. Electronically Signed   By: Amie Portland M.D.   On: 10/27/2022 15:37       Assessment & Plan:  Essential hypertension, benign Assessment & Plan: Blood pressure doing well.  On benicar.  Follow pressures.  Follow metabolic panel.   Orders: -     Basic metabolic panel  Hypercholesterolemia Assessment & Plan: On crestor 20mg  q day.  Low cholesterol diet and  exercise.  Follow lipid panel and liver function tests.    Orders: -     Hepatic function panel -     Lipid panel  Hyperglycemia Assessment & Plan: Low carb diet and exercise.  Follow met b and a1c.   Orders: -     Hemoglobin A1c  Right knee pain, unspecified chronicity Assessment & Plan: Persistent right knee pain.  Worsening recently.  Check xray.  Tylenol.  Follow. Discussed PT vs Ortho referral.   Orders: -     DG Knee 1-2 Views Right; Future  Abnormal liver function Assessment & Plan: Continue diet and exercise. Follow liver panel.    Stage 3a chronic kidney disease (HCC) Assessment & Plan: Avoid antiinflammatories.  Stay hydrated.  Follow metabolic panel.  Continue benicar.    Colon cancer screening Assessment & Plan: Colonoscopy 12/2021 - Three 6 mm polyps in the sigmoid and descending colon.     History of frequent urinary tract infections Assessment & Plan: Continues daily keflex.  Self caths.  Stable.  Follow.    Obsessive-compulsive disorder, unspecified type Assessment & Plan: Continue zoloft.  Stable.    History of excessive cerumen Assessment & Plan: Debrox as directed.  Follow.    Other orders -     Bisoprolol Fumarate; Take 1 tablet (5 mg total) by mouth daily.  Dispense: 90 tablet; Refill: 3 -     Cephalexin; TAKE 1 CAPSULE BY MOUTH EVERY DAY  Dispense: 90 capsule; Refill: 3 -     hydroCHLOROthiazide; Take 1 capsule (12.5 mg total) by mouth daily.  Dispense: 90 capsule; Refill: 3 -     Olmesartan Medoxomil; Take 1 tablet (40 mg total) by mouth daily.  Dispense: 90 tablet; Refill: 3 -     Omeprazole; Take 1 capsule (20 mg total) by mouth 2 (two) times daily.  Dispense: 180 capsule; Refill: 3 -     Rosuvastatin Calcium; Take 1 tablet (20 mg total) by mouth daily.  Dispense: 90 tablet; Refill: 3 -     Sertraline HCl; Take 1.5 tablets (75 mg total) by mouth daily.  Dispense: 135 tablet; Refill: 1     Dale Cambria, MD

## 2022-12-08 NOTE — Assessment & Plan Note (Signed)
Continue zoloft.  Stable.  

## 2022-12-08 NOTE — Assessment & Plan Note (Signed)
Persistent right knee pain.  Worsening recently.  Check xray.  Tylenol.  Follow. Discussed PT vs Ortho referral.

## 2022-12-08 NOTE — Assessment & Plan Note (Signed)
On crestor 20mg q day.  Low cholesterol diet and exercise.  Follow lipid panel and liver function tests.   

## 2022-12-08 NOTE — Assessment & Plan Note (Signed)
Avoid antiinflammatories.  Stay hydrated.  Follow metabolic panel.  Continue benicar.

## 2022-12-08 NOTE — Assessment & Plan Note (Signed)
Colonoscopy 12/2021 - Three 6 mm polyps in the sigmoid and descending colon.

## 2022-12-08 NOTE — Assessment & Plan Note (Signed)
Low carb diet and exercise.  Follow met b and a1c.   

## 2022-12-08 NOTE — Assessment & Plan Note (Signed)
Debrox as directed.  Follow.

## 2022-12-08 NOTE — Assessment & Plan Note (Signed)
Continues daily keflex.  Self caths.  Stable.  Follow.  

## 2022-12-12 DIAGNOSIS — R32 Unspecified urinary incontinence: Secondary | ICD-10-CM | POA: Diagnosis not present

## 2022-12-17 ENCOUNTER — Telehealth: Payer: Self-pay | Admitting: Internal Medicine

## 2022-12-17 NOTE — Telephone Encounter (Signed)
Noted  

## 2022-12-17 NOTE — Telephone Encounter (Signed)
Pt called in to let [place] LPN knows that she's not interested in PT at the moment, but if she does not get better in the future, she will keep [place] updated about the PT.

## 2023-02-22 ENCOUNTER — Ambulatory Visit (INDEPENDENT_AMBULATORY_CARE_PROVIDER_SITE_OTHER): Payer: Medicare PPO | Admitting: *Deleted

## 2023-02-22 VITALS — Ht 64.0 in | Wt 175.0 lb

## 2023-02-22 DIAGNOSIS — Z0000 Encounter for general adult medical examination without abnormal findings: Secondary | ICD-10-CM | POA: Diagnosis not present

## 2023-02-22 NOTE — Patient Instructions (Signed)
Ms. Kinnaird , Thank you for taking time to come for your Medicare Wellness Visit. I appreciate your ongoing commitment to your health goals. Please review the following plan we discussed and let me know if I can assist you in the future.   Referrals/Orders/Follow-Ups/Clinician Recommendations: Remember to update your TDAP and consider the Shingles vaccine.  This is a list of the screening recommended for you and due dates:  Health Maintenance  Topic Date Due   Hepatitis C Screening  Never done   DTaP/Tdap/Td vaccine (1 - Tdap) Never done   Zoster (Shingles) Vaccine (1 of 2) Never done   DEXA scan (bone density measurement)  Never done   Mammogram  10/26/2023   Medicare Annual Wellness Visit  02/22/2024   Pneumonia Vaccine  Completed   Flu Shot  Completed   COVID-19 Vaccine  Completed   HPV Vaccine  Aged Out   Colon Cancer Screening  Discontinued    Advanced directives: (Copy Requested) Please bring a copy of your health care power of attorney and living will to the office to be added to your chart at your convenience.  Next Medicare Annual Wellness Visit scheduled for next year: Yes 02/28/24 @ 1:30

## 2023-02-22 NOTE — Progress Notes (Signed)
Subjective:   Lindsey Harmon is a 77 y.o. female who presents for Medicare Annual (Subsequent) preventive examination.  Visit Complete: Virtual I connected with  Tamala Julian on 02/22/23 by a audio enabled telemedicine application and verified that I am speaking with the correct person using two identifiers.  Patient Location: Home  Provider Location: Office/Clinic  I discussed the limitations of evaluation and management by telemedicine. The patient expressed understanding and agreed to proceed.  Vital Signs: Because this visit was a virtual/telehealth visit, some criteria may be missing or patient reported. Any vitals not documented were not able to be obtained and vitals that have been documented are patient reported.  Patient Medicare AWV questionnaire was completed by the patient on 02/19/23; I have confirmed that all information answered by patient is correct and no changes since this date.  Cardiac Risk Factors include: advanced age (>44men, >81 women);dyslipidemia;hypertension;obesity (BMI >30kg/m2)     Objective:    Today's Vitals   02/22/23 1359  Weight: 175 lb (79.4 kg)  Height: 5\' 4"  (1.626 m)   Body mass index is 30.04 kg/m.     02/22/2023    2:15 PM 02/17/2022    2:53 PM 12/23/2021    9:57 AM 02/16/2021    3:48 PM 01/16/2020    1:14 PM 11/27/2018    9:12 AM 09/22/2017   10:50 AM  Advanced Directives  Does Patient Have a Medical Advance Directive? Yes Yes Yes Yes Yes Yes Yes  Type of Estate agent of [place];Living will Healthcare Power of [place];Living will  Healthcare Power of [place];Living will Healthcare Power of [place];Living will Healthcare Power of [place];Living will Healthcare Power of [place];Living will  Does patient want to make changes to medical advance directive?  No - Patient declined  No - Patient declined No - Patient declined No - Patient declined No - Patient declined  Copy of Healthcare Power of Attorney in Chart? No  - copy requested No - copy requested  No - copy requested No - copy requested No - copy requested No - copy requested    Current Medications (verified) Outpatient Encounter Medications as of 02/22/2023  Medication Sig   bisoprolol (ZEBETA) 5 MG tablet Take 1 tablet (5 mg total) by mouth daily.   Calcium Carbonate-Vitamin D (CALTRATE 600+D PO) Take by mouth.   cephALEXin (KEFLEX) 250 MG capsule TAKE 1 CAPSULE BY MOUTH EVERY DAY   hydrochlorothiazide (MICROZIDE) 12.5 MG capsule Take 1 capsule (12.5 mg total) by mouth daily.   olmesartan (BENICAR) 40 MG tablet Take 1 tablet (40 mg total) by mouth daily.   Omega-3 Fatty Acids (FISH OIL) 1200 MG CAPS Take by mouth daily.   omeprazole (PRILOSEC) 20 MG capsule Take 1 capsule (20 mg total) by mouth 2 (two) times daily.   rosuvastatin (CRESTOR) 20 MG tablet Take 1 tablet (20 mg total) by mouth daily.   sertraline (ZOLOFT) 50 MG tablet Take 1.5 tablets (75 mg total) by mouth daily.   No facility-administered encounter medications on file as of 02/22/2023.    Allergies (verified) Ace inhibitors   History: Past Medical History:  Diagnosis Date   Abnormal liver function    Cystocele, unspecified (CODE)    Fibrocystic breast disease    GERD (gastroesophageal reflux disease)    Hypertension    Intrinsic sphincter deficiency    Pelvic relaxation    Pure hypercholesterolemia    Rectocele    SUI (stress urinary incontinence, female)    Urinary retention  Urinary retention    Past Surgical History:  Procedure Laterality Date   ABDOMINAL HYSTERECTOMY     APPENDECTOMY     Bladder Tack     burch urethropexy  10/24/2000   laparoscopic colpopexy/culdoplasty   COLONOSCOPY WITH PROPOFOL N/A 08/10/2016   Procedure: COLONOSCOPY WITH PROPOFOL;  Surgeon: Christena Deem, MD;  Location: [facility] ENDOSCOPY;  Service: Endoscopy;  Laterality: N/A;   COLONOSCOPY WITH PROPOFOL N/A 10/20/2016   Procedure: COLONOSCOPY WITH PROPOFOL;  Surgeon: Earline Mayotte, MD;  Location: [facility] ENDOSCOPY;  Service: Endoscopy;  Laterality: N/A;   COLONOSCOPY WITH PROPOFOL N/A 06/01/2017   Procedure: COLONOSCOPY WITH PROPOFOL;  Surgeon: Earline Mayotte, MD;  Location: [facility] ENDOSCOPY;  Service: Endoscopy;  Laterality: N/A;   COLONOSCOPY WITH PROPOFOL N/A 12/23/2021   Procedure: COLONOSCOPY WITH PROPOFOL;  Surgeon: Earline Mayotte, MD;  Location: [facility] ENDOSCOPY;  Service: Endoscopy;  Laterality: N/A;   COLPORRHAPHY     forv repair cystocele anterior   TRANSVAGINAL TAPE (TVT) REMOVAL     Family History  Problem Relation Age of Onset   Pneumonia Mother        died of presumed aspiration pneumonia   Hypertension Mother    Cancer Father        prostate   Heart disease Paternal Grandfather        myocardial infarction   Alzheimer's disease Brother    Breast cancer Neg Hx    Social History   Socioeconomic History   Marital status: Married    Spouse name: Not on file   Number of children: Not on file   Years of education: Not on file   Highest education level: 12th grade  Occupational History   Not on file  Tobacco Use   Smoking status: Never   Smokeless tobacco: Never  Vaping Use   Vaping status: Never Used  Substance and Sexual Activity   Alcohol use: No    Alcohol/week: 0.0 standard drinks of alcohol   Drug use: No   Sexual activity: Not on file  Other Topics Concern   Not on file  Social History Narrative   married   Social Determinants of Health   Financial Resource Strain: Low Risk  (02/19/2023)   Overall Financial Resource Strain (CARDIA)    Difficulty of Paying Living Expenses: Not hard at all  Food Insecurity: No Food Insecurity (02/19/2023)   Hunger Vital Sign    Worried About Running Out of Food in the Last Year: Never true    Ran Out of Food in the Last Year: Never true  Transportation Needs: No Transportation Needs (02/19/2023)   PRAPARE - Administrator, Civil Service (Medical): No    Lack of Transportation  (Non-Medical): No  Physical Activity: Insufficiently Active (02/19/2023)   Exercise Vital Sign    Days of Exercise per Week: 2 days    Minutes of Exercise per Session: 10 min  Stress: No Stress Concern Present (02/19/2023)   [employer] of Occupational Health - Occupational Stress Questionnaire    Feeling of Stress : Only a little  Recent Concern: Stress - Stress Concern Present (12/07/2022)   [employer] of Occupational Health - Occupational Stress Questionnaire    Feeling of Stress : To some extent  Social Connections: Socially Integrated (02/19/2023)   Social Connection and Isolation Panel [NHANES]    Frequency of Communication with Friends and Family: Three times a week    Frequency of Social Gatherings with Friends and Family: Twice a  week    Attends Religious Services: More than 4 times per year    Active Member of Clubs or Organizations: Yes    Attends Engineer, structural: More than 4 times per year    Marital Status: Married    Tobacco Counseling Counseling given: Not Answered   Clinical Intake:  Pre-visit preparation completed: Yes  Pain : No/denies pain     BMI - recorded: 30.04 Nutritional Status: BMI > 30  Obese Nutritional Risks: None Diabetes: No  How often do you need to have someone help you when you read instructions, pamphlets, or other written materials from your doctor or pharmacy?: 1 - Never  Interpreter Needed?: No  Information entered by :: R. Danisa Kopec LPN   Activities of Daily Living    02/19/2023    9:47 AM  In your present state of health, do you have any difficulty performing the following activities:  Hearing? 0  Vision? 0  Difficulty concentrating or making decisions? 0  Walking or climbing stairs? 0  Dressing or bathing? 0  Doing errands, shopping? 0  Preparing Food and eating ? N  Using the Toilet? N  In the past six months, have you accidently leaked urine? N  Do you have problems with loss of bowel control? N   Managing your Medications? N  Managing your Finances? N  Housekeeping or managing your Housekeeping? N    Patient Care Team: Dale Shiloh, MD as PCP - General (Internal Medicine)  Indicate any recent Medical Services you may have received from other than [facility] providers in the past year (date may be approximate).     Assessment:   This is a routine wellness examination for Alasha.  Hearing/Vision screen Hearing Screening - Comments:: Some difficulty Vision Screening - Comments:: glasses   Goals Addressed             This Visit's Progress    Patient Stated       Wants to exercise more and watch what she eats       Depression Screen    02/22/2023    2:08 PM 07/05/2022   10:39 AM 03/02/2022   10:39 AM 02/17/2022    2:55 PM 10/30/2021   10:34 AM 02/16/2021    3:47 PM 02/01/2020   10:44 AM  PHQ 2/9 Scores  PHQ - 2 Score 0 0 0 0 0 0 0  PHQ- 9 Score 0          Fall Risk    02/19/2023    9:47 AM 07/05/2022   10:39 AM 03/02/2022   10:39 AM 02/17/2022    2:56 PM 10/30/2021   10:34 AM  Fall Risk   Falls in the past year? 0 0 0 0 0  Number falls in past yr: 0 0 0 0   Injury with Fall? 0 0 0 0   Risk for fall due to : No Fall Risks No Fall Risks No Fall Risks No Fall Risks No Fall Risks  Follow up Falls prevention discussed;Falls evaluation completed Falls evaluation completed Falls evaluation completed Falls evaluation completed Falls evaluation completed    MEDICARE RISK AT HOME: Medicare Risk at Home Any stairs in or around the home?: No If so, are there any without handrails?: No Home free of loose throw rugs in walkways, pet beds, electrical cords, etc?: Yes Adequate lighting in your home to reduce risk of falls?: Yes Life alert?: No Use of a cane, walker or w/c?: No Grab bars in the  bathroom?: No Shower chair or bench in shower?: No Elevated toilet seat or a handicapped toilet?: No   Cognitive Function:    09/22/2017   10:51 AM 09/21/2016   11:04 AM  MMSE  - Mini Mental State Exam  Orientation to time 5 5  Orientation to Place 5 5  Registration 3 3  Attention/ Calculation 5 5  Recall 3 3  Language- name 2 objects 2 2  Language- repeat 1 1  Language- follow 3 step command 3 3  Language- read & follow direction 1 1  Write a sentence 1 1  Copy design 1 1  Total score 30 30        02/22/2023    2:16 PM 11/27/2018    9:14 AM  6CIT Screen  What Year? 0 points 0 points  What month? 0 points 0 points  What time? 0 points 0 points  Count back from 20 0 points 0 points  Months in reverse 0 points 0 points  Repeat phrase 0 points 0 points  Total Score 0 points 0 points    Immunizations Immunization History  Administered Date(s) Administered   Fluad Quad(high Dose 65+) 01/25/2019, 02/01/2020, 02/20/2021, 03/02/2022   Influenza, High Dose Seasonal PF 02/17/2016, 02/22/2017, 02/28/2018, 02/09/2023   PFIZER Comirnaty(Gray Top)Covid-19 Tri-Sucrose Vaccine 09/11/2020   PFIZER(Purple Top)SARS-COV-2 Vaccination 06/05/2019, 06/30/2019, 03/05/2020   Pfizer Covid-19 Vaccine Bivalent Booster 50yrs & up 03/12/2021   Pfizer(Comirnaty)Fall Seasonal Vaccine 12 years and older 03/05/2022, 02/09/2023   Pneumococcal Conjugate-13 11/24/2016   Pneumococcal Polysaccharide-23 06/01/2018   Respiratory Syncytial Virus Vaccine,Recomb Aduvanted(Arexvy) 08/05/2022    TDAP status: Due, Education has been provided regarding the importance of this vaccine. Advised may receive this vaccine at local pharmacy or Health Dept. Aware to provide a copy of the vaccination record if obtained from local pharmacy or Health Dept. Verbalized acceptance and understanding.  Flu Vaccine status: Up to date  Pneumococcal vaccine status: Up to date  Covid-19 vaccine status: Completed vaccines  Qualifies for Shingles Vaccine? Yes   Zostavax completed No   Shingrix Completed?: No.    Education has been provided regarding the importance of this vaccine. Patient has been advised to  call insurance company to determine out of pocket expense if they have not yet received this vaccine. Advised may also receive vaccine at local pharmacy or Health Dept. Verbalized acceptance and understanding.  Screening Tests Health Maintenance  Topic Date Due   Hepatitis C Screening  Never done   DTaP/Tdap/Td (1 - Tdap) Never done   Zoster Vaccines- Shingrix (1 of 2) Never done   DEXA SCAN  Never done   Medicare Annual Wellness (AWV)  02/18/2023   Pneumonia Vaccine 28+ Years old  Completed   INFLUENZA VACCINE  Completed   COVID-19 Vaccine  Completed   HPV VACCINES  Aged Out   Colonoscopy  Discontinued    Health Maintenance  Health Maintenance Due  Topic Date Due   Hepatitis C Screening  Never done   DTaP/Tdap/Td (1 - Tdap) Never done   Zoster Vaccines- Shingrix (1 of 2) Never done   DEXA SCAN  Never done   Medicare Annual Wellness (AWV)  02/18/2023    Colorectal cancer screening: No longer required.   Mammogram status: Completed 10/2022. Repeat every year  Bone Density Status Patient declines at this time   Lung Cancer Screening: (Low Dose CT Chest recommended if Age 63-80 years, 20 pack-year currently smoking OR have quit w/in 15years.) does not qualify.  Additional Screening:  Hepatitis C Screening: does qualify; Completed Patient declined  Vision Screening: Recommended annual ophthalmology exams for early detection of glaucoma and other disorders of the eye. Is the patient up to date with their annual eye exam?  Yes  Who is the provider or what is the name of the office in which the patient attends annual eye exams? [facility] If pt is not established with a provider, would they like to be referred to a provider to establish care? No .   Dental Screening: Recommended annual dental exams for proper oral hygiene    Community Resource Referral / Chronic Care Management: CRR required this visit?  No   CCM required this visit?  No     Plan:     I  have personally reviewed and noted the following in the patient's chart:   Medical and social history Use of alcohol, tobacco or illicit drugs  Current medications and supplements including opioid prescriptions. Patient is not currently taking opioid prescriptions. Functional ability and status Nutritional status Physical activity Advanced directives List of other physicians Hospitalizations, surgeries, and ER visits in previous 12 months Vitals Screenings to include cognitive, depression, and falls Referrals and appointments  In addition, I have reviewed and discussed with patient certain preventive protocols, quality metrics, and best practice recommendations. A written personalized care plan for preventive services as well as general preventive health recommendations were provided to patient.     Sydell Axon, LPN   16/05/958   After Visit Summary: (MyChart) Due to this being a telephonic visit, the after visit summary with patients personalized plan was offered to patient via MyChart   Nurse Notes: None

## 2023-03-07 DIAGNOSIS — R32 Unspecified urinary incontinence: Secondary | ICD-10-CM | POA: Diagnosis not present

## 2023-03-30 DIAGNOSIS — H35371 Puckering of macula, right eye: Secondary | ICD-10-CM | POA: Diagnosis not present

## 2023-03-30 DIAGNOSIS — H2513 Age-related nuclear cataract, bilateral: Secondary | ICD-10-CM | POA: Diagnosis not present

## 2023-03-30 DIAGNOSIS — D3132 Benign neoplasm of left choroid: Secondary | ICD-10-CM | POA: Diagnosis not present

## 2023-03-30 NOTE — Telephone Encounter (Signed)
Error

## 2023-04-05 ENCOUNTER — Telehealth: Payer: Self-pay | Admitting: Internal Medicine

## 2023-04-05 NOTE — Telephone Encounter (Signed)
Patient need lab orders.

## 2023-04-06 ENCOUNTER — Other Ambulatory Visit: Payer: Self-pay | Admitting: Internal Medicine

## 2023-04-06 ENCOUNTER — Other Ambulatory Visit: Payer: Self-pay | Admitting: *Deleted

## 2023-04-06 DIAGNOSIS — R739 Hyperglycemia, unspecified: Secondary | ICD-10-CM

## 2023-04-06 DIAGNOSIS — E7800 Pure hypercholesterolemia, unspecified: Secondary | ICD-10-CM

## 2023-04-06 DIAGNOSIS — I10 Essential (primary) hypertension: Secondary | ICD-10-CM

## 2023-04-06 DIAGNOSIS — N1831 Chronic kidney disease, stage 3a: Secondary | ICD-10-CM

## 2023-04-06 NOTE — Progress Notes (Signed)
Order placed for met b - to go along with other labs.

## 2023-04-06 NOTE — Progress Notes (Signed)
Orders placed.

## 2023-04-08 ENCOUNTER — Other Ambulatory Visit (INDEPENDENT_AMBULATORY_CARE_PROVIDER_SITE_OTHER): Payer: Medicare PPO

## 2023-04-08 DIAGNOSIS — N1831 Chronic kidney disease, stage 3a: Secondary | ICD-10-CM

## 2023-04-08 DIAGNOSIS — E7800 Pure hypercholesterolemia, unspecified: Secondary | ICD-10-CM | POA: Diagnosis not present

## 2023-04-08 DIAGNOSIS — I10 Essential (primary) hypertension: Secondary | ICD-10-CM

## 2023-04-08 DIAGNOSIS — R739 Hyperglycemia, unspecified: Secondary | ICD-10-CM | POA: Diagnosis not present

## 2023-04-08 LAB — COMPREHENSIVE METABOLIC PANEL
ALT: 20 U/L (ref 0–35)
AST: 26 U/L (ref 0–37)
Albumin: 4.4 g/dL (ref 3.5–5.2)
Alkaline Phosphatase: 66 U/L (ref 39–117)
BUN: 24 mg/dL — ABNORMAL HIGH (ref 6–23)
CO2: 30 meq/L (ref 19–32)
Calcium: 9.8 mg/dL (ref 8.4–10.5)
Chloride: 102 meq/L (ref 96–112)
Creatinine, Ser: 0.97 mg/dL (ref 0.40–1.20)
GFR: 55.43 mL/min — ABNORMAL LOW (ref >60.00)
Glucose, Bld: 103 mg/dL — ABNORMAL HIGH (ref 70–99)
Potassium: 3.7 meq/L (ref 3.5–5.1)
Sodium: 140 meq/L (ref 135–145)
Total Bilirubin: 0.7 mg/dL (ref 0.2–1.2)
Total Protein: 6.8 g/dL (ref 6.0–8.3)

## 2023-04-08 LAB — HEMOGLOBIN A1C: Hgb A1c MFr Bld: 6.2 % (ref 4.6–6.5)

## 2023-04-08 LAB — HEPATIC FUNCTION PANEL
ALT: 20 U/L (ref 0–35)
AST: 26 U/L (ref 0–37)
Albumin: 4.4 g/dL (ref 3.5–5.2)
Alkaline Phosphatase: 66 U/L (ref 39–117)
Bilirubin, Direct: 0.1 mg/dL (ref 0.0–0.3)
Total Bilirubin: 0.7 mg/dL (ref 0.2–1.2)
Total Protein: 6.8 g/dL (ref 6.0–8.3)

## 2023-04-08 LAB — BASIC METABOLIC PANEL
BUN: 24 mg/dL — ABNORMAL HIGH (ref 6–23)
CO2: 30 meq/L (ref 19–32)
Calcium: 9.8 mg/dL (ref 8.4–10.5)
Chloride: 102 meq/L (ref 96–112)
Creatinine, Ser: 0.97 mg/dL (ref 0.40–1.20)
GFR: 55.43 mL/min — ABNORMAL LOW (ref >60.00)
Glucose, Bld: 103 mg/dL — ABNORMAL HIGH (ref 70–99)
Potassium: 3.7 meq/L (ref 3.5–5.1)
Sodium: 140 meq/L (ref 135–145)

## 2023-04-08 LAB — LIPID PANEL
Cholesterol: 120 mg/dL (ref 0–200)
HDL: 38.2 mg/dL — ABNORMAL LOW (ref >39.00)
LDL Cholesterol: 53 mg/dL (ref 0–99)
NonHDL: 81.87
Total CHOL/HDL Ratio: 3
Triglycerides: 145 mg/dL (ref 0.0–149.0)
VLDL: 29 mg/dL (ref 0.0–40.0)

## 2023-04-12 ENCOUNTER — Ambulatory Visit (INDEPENDENT_AMBULATORY_CARE_PROVIDER_SITE_OTHER): Payer: Medicare PPO | Admitting: Internal Medicine

## 2023-04-12 VITALS — BP 112/70 | HR 65 | Temp 97.9°F | Resp 16 | Ht 64.0 in | Wt 174.8 lb

## 2023-04-12 DIAGNOSIS — Z860100 Personal history of colon polyps, unspecified: Secondary | ICD-10-CM

## 2023-04-12 DIAGNOSIS — E7800 Pure hypercholesterolemia, unspecified: Secondary | ICD-10-CM

## 2023-04-12 DIAGNOSIS — R739 Hyperglycemia, unspecified: Secondary | ICD-10-CM | POA: Diagnosis not present

## 2023-04-12 DIAGNOSIS — I10 Essential (primary) hypertension: Secondary | ICD-10-CM | POA: Diagnosis not present

## 2023-04-12 DIAGNOSIS — N1831 Chronic kidney disease, stage 3a: Secondary | ICD-10-CM | POA: Diagnosis not present

## 2023-04-12 DIAGNOSIS — M25562 Pain in left knee: Secondary | ICD-10-CM | POA: Diagnosis not present

## 2023-04-12 DIAGNOSIS — F429 Obsessive-compulsive disorder, unspecified: Secondary | ICD-10-CM

## 2023-04-12 DIAGNOSIS — Z87440 Personal history of urinary (tract) infections: Secondary | ICD-10-CM | POA: Diagnosis not present

## 2023-04-12 MED ORDER — SERTRALINE HCL 50 MG PO TABS: 75.0000 mg | ORAL_TABLET | Freq: Every day | ORAL | 1 refills | Status: DC

## 2023-04-12 NOTE — Progress Notes (Signed)
Subjective:    Patient ID: Lindsey Harmon, female    DOB: [birth year], 77 y.o.   MRN: [id number]  Patient here for  Chief Complaint  Patient presents with   Medical Management of Chronic Issues    HPI Here for f/u - f/u regarding hypercholesterolemia and hypertension. Previous right knee pain.  Xray with some arthritis changes.  Right knee is better. She is now having issues with left leg - posterior knee/left lower posterior thigh.  No increased swelling or redness.  Increased pain - posterior with flexion of knee.  No chest pain or sob reported.  No abdominal pain or bowel change reported.  Discussed increased stress/worry.  Does not feel needs any further intervention at this time.  Continues on zoloft.     Past Medical History:  Diagnosis Date   Abnormal liver function    Cystocele, unspecified (CODE)    Fibrocystic breast disease    GERD (gastroesophageal reflux disease)    Hypertension    Intrinsic sphincter deficiency    Pelvic relaxation    Pure hypercholesterolemia    Rectocele    SUI (stress urinary incontinence, female)    Urinary retention    Urinary retention    Past Surgical History:  Procedure Laterality Date   ABDOMINAL HYSTERECTOMY     APPENDECTOMY     Bladder Tack     burch urethropexy  10/24/2000   laparoscopic colpopexy/culdoplasty   COLONOSCOPY WITH PROPOFOL N/A 08/10/2016   Procedure: COLONOSCOPY WITH PROPOFOL;  Surgeon: Christena Deem, MD;  Location: [facility] ENDOSCOPY;  Service: Endoscopy;  Laterality: N/A;   COLONOSCOPY WITH PROPOFOL N/A 10/20/2016   Procedure: COLONOSCOPY WITH PROPOFOL;  Surgeon: Earline Mayotte, MD;  Location: [facility] ENDOSCOPY;  Service: Endoscopy;  Laterality: N/A;   COLONOSCOPY WITH PROPOFOL N/A 06/01/2017   Procedure: COLONOSCOPY WITH PROPOFOL;  Surgeon: Earline Mayotte, MD;  Location: [facility] ENDOSCOPY;  Service: Endoscopy;  Laterality: N/A;   COLONOSCOPY WITH PROPOFOL N/A 12/23/2021   Procedure: COLONOSCOPY WITH PROPOFOL;  Surgeon:  Earline Mayotte, MD;  Location: [facility] ENDOSCOPY;  Service: Endoscopy;  Laterality: N/A;   COLPORRHAPHY     forv repair cystocele anterior   TRANSVAGINAL TAPE (TVT) REMOVAL     Family History  Problem Relation Age of Onset   Pneumonia Mother        died of presumed aspiration pneumonia   Hypertension Mother    Cancer Father        prostate   Heart disease Paternal Grandfather        myocardial infarction   Alzheimer's disease Brother    Breast cancer Neg Hx    Social History   Socioeconomic History   Marital status: Married    Spouse name: Not on file   Number of children: Not on file   Years of education: Not on file   Highest education level: 12th grade  Occupational History   Not on file  Tobacco Use   Smoking status: Never   Smokeless tobacco: Never  Vaping Use   Vaping status: Never Used  Substance and Sexual Activity   Alcohol use: No    Alcohol/week: 0.0 standard drinks of alcohol   Drug use: No   Sexual activity: Not on file  Other Topics Concern   Not on file  Social History Narrative   married   Social Determinants of Health   Financial Resource Strain: Low Risk  (04/08/2023)   Overall Financial Resource Strain (CARDIA)    Difficulty of Paying  Living Expenses: Not hard at all  Food Insecurity: No Food Insecurity (04/08/2023)   Hunger Vital Sign    Worried About Running Out of Food in the Last Year: Never true    Ran Out of Food in the Last Year: Never true  Transportation Needs: No Transportation Needs (04/08/2023)   PRAPARE - Administrator, Civil Service (Medical): No    Lack of Transportation (Non-Medical): No  Physical Activity: Insufficiently Active (04/08/2023)   Exercise Vital Sign    Days of Exercise per Week: 2 days    Minutes of Exercise per Session: 10 min  Stress: No Stress Concern Present (04/08/2023)   [employer] of Occupational Health - Occupational Stress Questionnaire    Feeling of Stress : Only a little   Social Connections: Socially Integrated (04/08/2023)   Social Connection and Isolation Panel [NHANES]    Frequency of Communication with Friends and Family: Twice a week    Frequency of Social Gatherings with Friends and Family: Once a week    Attends Religious Services: More than 4 times per year    Active Member of [employer] or Organizations: Yes    Attends Engineer, structural: More than 4 times per year    Marital Status: Married     Review of Systems  Constitutional:  Negative for appetite change and unexpected weight change.  HENT:  Negative for congestion and sinus pressure.   Respiratory:  Negative for cough, chest tightness and shortness of breath.   Cardiovascular:  Negative for chest pain, palpitations and leg swelling.  Gastrointestinal:  Negative for abdominal pain, diarrhea, nausea and vomiting.  Genitourinary:  Negative for difficulty urinating and dysuria.  Musculoskeletal:  Negative for myalgias.       Leg (left) pain as outlined.   Skin:  Negative for color change and rash.  Neurological:  Negative for dizziness and headaches.  Psychiatric/Behavioral:  Negative for agitation and dysphoric mood.        Objective:     BP 112/70   Pulse 65   Temp 97.9 F (36.6 C)   Resp 16   Ht 5\' 4"  (1.626 m)   Wt 174 lb 12.8 oz (79.3 kg)   SpO2 97%   BMI 30.00 kg/m  Wt Readings from Last 3 Encounters:  04/12/23 174 lb 12.8 oz (79.3 kg)  02/22/23 175 lb (79.4 kg)  12/08/22 175 lb 9.6 oz (79.7 kg)    Physical Exam Vitals reviewed.  Constitutional:      General: She is not in acute distress.    Appearance: Normal appearance.  HENT:     Head: Normocephalic and atraumatic.     Right Ear: External ear normal.     Left Ear: External ear normal.     Mouth/Throat:     Pharynx: No oropharyngeal exudate or posterior oropharyngeal erythema.  Eyes:     General: No scleral icterus.       Right eye: No discharge.        Left eye: No discharge.      Conjunctiva/sclera: Conjunctivae normal.  Neck:     Thyroid: No thyromegaly.  Cardiovascular:     Rate and Rhythm: Normal rate and regular rhythm.  Pulmonary:     Effort: No respiratory distress.     Breath sounds: Normal breath sounds. No wheezing.  Abdominal:     General: Bowel sounds are normal.     Palpations: Abdomen is soft.     Tenderness: There is no abdominal tenderness.  Musculoskeletal:        General: No swelling.     Cervical back: Neck supple. No tenderness.     Comments: Increased pain - posterior leg with flexion of knee. No pain with walking. No increased erythema or swelling.   Lymphadenopathy:     Cervical: No cervical adenopathy.  Skin:    Findings: No erythema or rash.  Neurological:     Mental Status: She is alert.  Psychiatric:        Mood and Affect: Mood normal.        Behavior: Behavior normal.      Outpatient Encounter Medications as of 04/12/2023  Medication Sig   bisoprolol (ZEBETA) 5 MG tablet Take 1 tablet (5 mg total) by mouth daily.   Calcium Carbonate-Vitamin D (CALTRATE 600+D PO) Take by mouth.   cephALEXin (KEFLEX) 250 MG capsule TAKE 1 CAPSULE BY MOUTH EVERY DAY   hydrochlorothiazide (MICROZIDE) 12.5 MG capsule Take 1 capsule (12.5 mg total) by mouth daily.   olmesartan (BENICAR) 40 MG tablet Take 1 tablet (40 mg total) by mouth daily.   Omega-3 Fatty Acids (FISH OIL) 1200 MG CAPS Take by mouth daily.   omeprazole (PRILOSEC) 20 MG capsule Take 1 capsule (20 mg total) by mouth 2 (two) times daily.   rosuvastatin (CRESTOR) 20 MG tablet Take 1 tablet (20 mg total) by mouth daily.   sertraline (ZOLOFT) 50 MG tablet Take 1.5 tablets (75 mg total) by mouth daily.   [DISCONTINUED] sertraline (ZOLOFT) 50 MG tablet Take 1.5 tablets (75 mg total) by mouth daily.   No facility-administered encounter medications on file as of 04/12/2023.     Lab Results  Component Value Date   WBC 5.5 07/05/2022   HGB 13.0 07/05/2022   HCT 38.3 07/05/2022    PLT 175.0 07/05/2022   GLUCOSE 103 (H) 04/08/2023   GLUCOSE 103 (H) 04/08/2023   CHOL 120 04/08/2023   TRIG 145.0 04/08/2023   HDL 38.20 (L) 04/08/2023   LDLDIRECT 83.0 05/30/2018   LDLCALC 53 04/08/2023   ALT 20 04/08/2023   ALT 20 04/08/2023   AST 26 04/08/2023   AST 26 04/08/2023   NA 140 04/08/2023   NA 140 04/08/2023   K 3.7 04/08/2023   K 3.7 04/08/2023   CL 102 04/08/2023   CL 102 04/08/2023   CREATININE 0.97 04/08/2023   CREATININE 0.97 04/08/2023   BUN 24 (H) 04/08/2023   BUN 24 (H) 04/08/2023   CO2 30 04/08/2023   CO2 30 04/08/2023   TSH 3.76 07/05/2022   HGBA1C 6.2 04/08/2023    MM 3D SCREENING MAMMOGRAM BILATERAL BREAST  Result Date: 10/27/2022 CLINICAL DATA:  Screening. EXAM: DIGITAL SCREENING BILATERAL MAMMOGRAM WITH TOMOSYNTHESIS AND CAD TECHNIQUE: Bilateral screening digital craniocaudal and mediolateral oblique mammograms were obtained. Bilateral screening digital breast tomosynthesis was performed. The images were evaluated with computer-aided detection. COMPARISON:  Previous exam(s). ACR Breast Density Category a: The breasts are almost entirely fatty. FINDINGS: There are no findings suspicious for malignancy. IMPRESSION: No mammographic evidence of malignancy. A result letter of this screening mammogram will be mailed directly to the patient. RECOMMENDATION: Screening mammogram in one year. (Code:SM-B-01Y) BI-RADS CATEGORY  1: Negative. Electronically Signed   By: Amie Portland M.D.   On: 10/27/2022 15:37       Assessment & Plan:  Hypercholesterolemia Assessment & Plan: On crestor 20mg  q day.  Low cholesterol diet and exercise.  Follow lipid panel and liver function tests.    Orders: -  Lipid panel; Future -     Hepatic function panel; Future  Hyperglycemia Assessment & Plan: Low carb diet and exercise.  Follow met b and a1c.  Lab Results  Component Value Date   HGBA1C 6.2 04/08/2023     Orders: -     Hemoglobin A1c; Future  Essential  hypertension, benign Assessment & Plan: Blood pressure doing well.  On benicar.  Follow pressures.  Follow metabolic panel.   Orders: -     CBC with Differential/Platelet; Future -     Basic metabolic panel; Future -     TSH; Future  Obsessive-compulsive disorder, unspecified type Assessment & Plan: Continue zoloft.  Stable.    History of frequent urinary tract infections Assessment & Plan: Continues daily keflex.  Self caths.  Stable.  Follow.    History of colon polyps Assessment & Plan: Colonoscopy 12/23/21 - Three 6 mm polyps in the sigmoid colon and in the descending colon - pathology tubular adenomas.    Stage 3a chronic kidney disease (HCC) Assessment & Plan: Avoid antiinflammatories.  Stay hydrated.  Follow metabolic panel.  Continue benicar.    Left knee pain, unspecified chronicity Assessment & Plan: Left knee/leg pain as outlined.  Exam as outlined. No increased swelling or erythema. Gentle stretches/exercise.  Discussed further w/up and evaluation.  Notify if desires further w/up/evaluation.    Other orders -     Sertraline HCl; Take 1.5 tablets (75 mg total) by mouth daily.  Dispense: 135 tablet; Refill: 1     Dale Conway, MD

## 2023-04-16 ENCOUNTER — Encounter: Payer: Self-pay | Admitting: Internal Medicine

## 2023-04-16 DIAGNOSIS — M25562 Pain in left knee: Secondary | ICD-10-CM | POA: Insufficient documentation

## 2023-04-16 NOTE — Assessment & Plan Note (Signed)
Blood pressure doing well.  On benicar.  Follow pressures.  Follow metabolic panel.  

## 2023-04-16 NOTE — Assessment & Plan Note (Signed)
Colonoscopy 12/23/21 - Three 6 mm polyps in the sigmoid colon and in the descending colon - pathology tubular adenomas.

## 2023-04-16 NOTE — Assessment & Plan Note (Signed)
Low carb diet and exercise.  Follow met b and a1c.  Lab Results  Component Value Date   HGBA1C 6.2 04/08/2023

## 2023-04-16 NOTE — Assessment & Plan Note (Signed)
Left knee/leg pain as outlined.  Exam as outlined. No increased swelling or erythema. Gentle stretches/exercise.  Discussed further w/up and evaluation.  Notify if desires further w/up/evaluation.

## 2023-04-16 NOTE — Assessment & Plan Note (Signed)
Continue zoloft.  Stable.  

## 2023-04-16 NOTE — Assessment & Plan Note (Signed)
Continues daily keflex.  Self caths.  Stable.  Follow.  

## 2023-04-16 NOTE — Assessment & Plan Note (Signed)
Avoid antiinflammatories.  Stay hydrated.  Follow metabolic panel.  Continue benicar.

## 2023-04-16 NOTE — Assessment & Plan Note (Signed)
On crestor 20mg q day.  Low cholesterol diet and exercise.  Follow lipid panel and liver function tests.   

## 2023-05-30 DIAGNOSIS — R32 Unspecified urinary incontinence: Secondary | ICD-10-CM | POA: Diagnosis not present

## 2023-07-01 DIAGNOSIS — H18413 Arcus senilis, bilateral: Secondary | ICD-10-CM | POA: Diagnosis not present

## 2023-07-01 DIAGNOSIS — H2513 Age-related nuclear cataract, bilateral: Secondary | ICD-10-CM | POA: Diagnosis not present

## 2023-07-01 DIAGNOSIS — H2512 Age-related nuclear cataract, left eye: Secondary | ICD-10-CM | POA: Diagnosis not present

## 2023-07-01 DIAGNOSIS — H25043 Posterior subcapsular polar age-related cataract, bilateral: Secondary | ICD-10-CM | POA: Diagnosis not present

## 2023-07-01 DIAGNOSIS — H25013 Cortical age-related cataract, bilateral: Secondary | ICD-10-CM | POA: Diagnosis not present

## 2023-08-09 ENCOUNTER — Other Ambulatory Visit (INDEPENDENT_AMBULATORY_CARE_PROVIDER_SITE_OTHER): Payer: Medicare PPO

## 2023-08-09 DIAGNOSIS — E7800 Pure hypercholesterolemia, unspecified: Secondary | ICD-10-CM

## 2023-08-09 DIAGNOSIS — R739 Hyperglycemia, unspecified: Secondary | ICD-10-CM | POA: Diagnosis not present

## 2023-08-09 DIAGNOSIS — I10 Essential (primary) hypertension: Secondary | ICD-10-CM

## 2023-08-09 LAB — HEPATIC FUNCTION PANEL
ALT: 23 U/L (ref 0–35)
AST: 29 U/L (ref 0–37)
Albumin: 4.5 g/dL (ref 3.5–5.2)
Alkaline Phosphatase: 62 U/L (ref 39–117)
Bilirubin, Direct: 0.2 mg/dL (ref 0.0–0.3)
Total Bilirubin: 0.9 mg/dL (ref 0.2–1.2)
Total Protein: 7.1 g/dL (ref 6.0–8.3)

## 2023-08-09 LAB — BASIC METABOLIC PANEL
BUN: 21 mg/dL (ref 6–23)
CO2: 29 meq/L (ref 19–32)
Calcium: 9.8 mg/dL (ref 8.4–10.5)
Chloride: 101 meq/L (ref 96–112)
Creatinine, Ser: 0.92 mg/dL (ref 0.40–1.20)
GFR: 58.93 mL/min — ABNORMAL LOW (ref >60.00)
Glucose, Bld: 96 mg/dL (ref 70–99)
Potassium: 3.7 meq/L (ref 3.5–5.1)
Sodium: 140 meq/L (ref 135–145)

## 2023-08-09 LAB — CBC WITH DIFFERENTIAL/PLATELET
Basophils Absolute: 0.1 10*3/uL (ref 0.0–0.1)
Basophils Relative: 1.1 % (ref 0.0–3.0)
Eosinophils Absolute: 0.2 10*3/uL (ref 0.0–0.7)
Eosinophils Relative: 3.3 % (ref 0.0–5.0)
HCT: 39 % (ref 36.0–46.0)
Hemoglobin: 13 g/dL (ref 12.0–15.0)
Lymphocytes Relative: 25.9 % (ref 12.0–46.0)
Lymphs Abs: 1.2 10*3/uL (ref 0.7–4.0)
MCHC: 33.4 g/dL (ref 30.0–36.0)
MCV: 87.1 fl (ref 78.0–100.0)
Monocytes Absolute: 0.3 10*3/uL (ref 0.1–1.0)
Monocytes Relative: 6.2 % (ref 3.0–12.0)
Neutro Abs: 3 10*3/uL (ref 1.4–7.7)
Neutrophils Relative %: 63.5 % (ref 43.0–77.0)
Platelets: 190 10*3/uL (ref 150.0–400.0)
RBC: 4.48 Mil/uL (ref 3.87–5.11)
RDW: 14.6 % (ref 11.5–15.5)
WBC: 4.8 10*3/uL (ref 4.0–10.5)

## 2023-08-09 LAB — LIPID PANEL
Cholesterol: 118 mg/dL (ref 0–200)
HDL: 43 mg/dL (ref >39.00)
LDL Cholesterol: 45 mg/dL (ref 0–99)
NonHDL: 75.3
Total CHOL/HDL Ratio: 3
Triglycerides: 153 mg/dL — ABNORMAL HIGH (ref 0.0–149.0)
VLDL: 30.6 mg/dL (ref 0.0–40.0)

## 2023-08-09 LAB — HEMOGLOBIN A1C: Hgb A1c MFr Bld: 6.1 % (ref 4.6–6.5)

## 2023-08-09 LAB — TSH: TSH: 4.03 u[IU]/mL (ref 0.35–5.50)

## 2023-08-10 ENCOUNTER — Other Ambulatory Visit: Payer: Medicare PPO

## 2023-08-10 ENCOUNTER — Encounter: Payer: Self-pay | Admitting: Internal Medicine

## 2023-08-12 ENCOUNTER — Encounter: Payer: Medicare PPO | Admitting: Internal Medicine

## 2023-08-19 DIAGNOSIS — H2512 Age-related nuclear cataract, left eye: Secondary | ICD-10-CM | POA: Diagnosis not present

## 2023-08-19 DIAGNOSIS — H2511 Age-related nuclear cataract, right eye: Secondary | ICD-10-CM | POA: Diagnosis not present

## 2023-08-22 DIAGNOSIS — R32 Unspecified urinary incontinence: Secondary | ICD-10-CM | POA: Diagnosis not present

## 2023-08-25 DIAGNOSIS — H2512 Age-related nuclear cataract, left eye: Secondary | ICD-10-CM | POA: Diagnosis not present

## 2023-08-30 ENCOUNTER — Encounter: Payer: Self-pay | Admitting: Internal Medicine

## 2023-08-30 ENCOUNTER — Ambulatory Visit (INDEPENDENT_AMBULATORY_CARE_PROVIDER_SITE_OTHER): Admitting: Internal Medicine

## 2023-08-30 VITALS — BP 130/70 | HR 80 | Temp 98.2°F | Resp 16 | Ht 64.0 in | Wt 173.0 lb

## 2023-08-30 DIAGNOSIS — I10 Essential (primary) hypertension: Secondary | ICD-10-CM

## 2023-08-30 DIAGNOSIS — F429 Obsessive-compulsive disorder, unspecified: Secondary | ICD-10-CM | POA: Diagnosis not present

## 2023-08-30 DIAGNOSIS — E7800 Pure hypercholesterolemia, unspecified: Secondary | ICD-10-CM | POA: Diagnosis not present

## 2023-08-30 DIAGNOSIS — Z87440 Personal history of urinary (tract) infections: Secondary | ICD-10-CM

## 2023-08-30 DIAGNOSIS — N1831 Chronic kidney disease, stage 3a: Secondary | ICD-10-CM | POA: Diagnosis not present

## 2023-08-30 DIAGNOSIS — E2839 Other primary ovarian failure: Secondary | ICD-10-CM

## 2023-08-30 DIAGNOSIS — Z0000 Encounter for general adult medical examination without abnormal findings: Secondary | ICD-10-CM

## 2023-08-30 DIAGNOSIS — R739 Hyperglycemia, unspecified: Secondary | ICD-10-CM

## 2023-08-30 DIAGNOSIS — Z1231 Encounter for screening mammogram for malignant neoplasm of breast: Secondary | ICD-10-CM

## 2023-08-30 MED ORDER — OMEPRAZOLE 20 MG PO CPDR: 20.0000 mg | DELAYED_RELEASE_CAPSULE | Freq: Two times a day (BID) | ORAL | 3 refills | Status: DC

## 2023-08-30 MED ORDER — BISOPROLOL FUMARATE 5 MG PO TABS
5.0000 mg | ORAL_TABLET | Freq: Every day | ORAL | 3 refills | Status: DC
Start: 2023-08-30 — End: 2023-10-10

## 2023-08-30 MED ORDER — CEPHALEXIN 250 MG PO CAPS: ORAL_CAPSULE | ORAL | 3 refills | Status: DC

## 2023-08-30 MED ORDER — ROSUVASTATIN CALCIUM 20 MG PO TABS: 20.0000 mg | ORAL_TABLET | Freq: Every day | ORAL | 3 refills | Status: DC

## 2023-08-30 MED ORDER — HYDROCHLOROTHIAZIDE 12.5 MG PO CAPS: 12.5000 mg | ORAL_CAPSULE | Freq: Every day | ORAL | 3 refills | Status: DC

## 2023-08-30 MED ORDER — OLMESARTAN MEDOXOMIL 40 MG PO TABS: 40.0000 mg | ORAL_TABLET | Freq: Every day | ORAL | 3 refills | Status: DC

## 2023-08-30 MED ORDER — SERTRALINE HCL 50 MG PO TABS: 75.0000 mg | ORAL_TABLET | Freq: Every day | ORAL | 1 refills | Status: DC

## 2023-08-30 NOTE — Progress Notes (Signed)
 Subjective:    Patient ID: Lindsey Harmon, female    DOB: [birth year], 78 y.o.   MRN: [id number]  Patient here for  Chief Complaint  Patient presents with   Annual Exam    HPI Here for a physical exam. Continues on zoloft. Last visit, left leg pain. Continues on benicar. Stays active. No chest pain or sob reported. No increased cough or congestion. No abdominal pain or bowel change. Handling stress. Taking zoloft. Discussed bone density.    Past Medical History:  Diagnosis Date   Abnormal liver function    Cystocele, unspecified (CODE)    Fibrocystic breast disease    GERD (gastroesophageal reflux disease)    Hypertension    Intrinsic sphincter deficiency    Pelvic relaxation    Pure hypercholesterolemia    Rectocele    SUI (stress urinary incontinence, female)    Urinary retention    Urinary retention    Past Surgical History:  Procedure Laterality Date   ABDOMINAL HYSTERECTOMY     APPENDECTOMY     Bladder Tack     burch urethropexy  10/24/2000   laparoscopic colpopexy/culdoplasty   COLONOSCOPY WITH PROPOFOL N/A 08/10/2016   Procedure: COLONOSCOPY WITH PROPOFOL;  Surgeon: Christena Deem, MD;  Location: [facility] ENDOSCOPY;  Service: Endoscopy;  Laterality: N/A;   COLONOSCOPY WITH PROPOFOL N/A 10/20/2016   Procedure: COLONOSCOPY WITH PROPOFOL;  Surgeon: Earline Mayotte, MD;  Location: [facility] ENDOSCOPY;  Service: Endoscopy;  Laterality: N/A;   COLONOSCOPY WITH PROPOFOL N/A 06/01/2017   Procedure: COLONOSCOPY WITH PROPOFOL;  Surgeon: Earline Mayotte, MD;  Location: [facility] ENDOSCOPY;  Service: Endoscopy;  Laterality: N/A;   COLONOSCOPY WITH PROPOFOL N/A 12/23/2021   Procedure: COLONOSCOPY WITH PROPOFOL;  Surgeon: Earline Mayotte, MD;  Location: [facility] ENDOSCOPY;  Service: Endoscopy;  Laterality: N/A;   COLPORRHAPHY     forv repair cystocele anterior   TRANSVAGINAL TAPE (TVT) REMOVAL     Family History  Problem Relation Age of Onset   Pneumonia Mother        died of presumed  aspiration pneumonia   Hypertension Mother    Cancer Father        prostate   Heart disease Paternal Grandfather        myocardial infarction   Alzheimer's disease Brother    Breast cancer Neg Hx    Social History   Socioeconomic History   Marital status: Married    Spouse name: Not on file   Number of children: Not on file   Years of education: Not on file   Highest education level: 12th grade  Occupational History   Not on file  Tobacco Use   Smoking status: Never   Smokeless tobacco: Never  Vaping Use   Vaping status: Never Used  Substance and Sexual Activity   Alcohol use: No    Alcohol/week: 0.0 standard drinks of alcohol   Drug use: No   Sexual activity: Not on file  Other Topics Concern   Not on file  Social History Narrative   married   Social Drivers of Corporate investment banker Strain: Low Risk  (08/26/2023)   Overall Financial Resource Strain (CARDIA)    Difficulty of Paying Living Expenses: Not hard at all  Food Insecurity: No Food Insecurity (08/26/2023)   Hunger Vital Sign    Worried About Running Out of Food in the Last Year: Never true    Ran Out of Food in the Last Year: Never true  Transportation Needs:  No Transportation Needs (08/26/2023)   PRAPARE - Administrator, Civil Service (Medical): No    Lack of Transportation (Non-Medical): No  Physical Activity: Unknown (08/26/2023)   Exercise Vital Sign    Days of Exercise per Week: 2 days    Minutes of Exercise per Session: Patient declined  Stress: No Stress Concern Present (08/26/2023)   [employer] of Occupational Health - Occupational Stress Questionnaire    Feeling of Stress : Only a little  Social Connections: Socially Integrated (08/26/2023)   Social Connection and Isolation Panel [NHANES]    Frequency of Communication with Friends and Family: Twice a week    Frequency of Social Gatherings with Friends and Family: Twice a week    Attends Religious Services: More than 4 times  per year    Active Member of [employer] or Organizations: Yes    Attends Engineer, structural: More than 4 times per year    Marital Status: Married     Review of Systems  Constitutional:  Negative for appetite change and unexpected weight change.  HENT:  Negative for congestion, sinus pressure and sore throat.   Eyes:  Negative for pain and visual disturbance.  Respiratory:  Negative for cough, chest tightness and shortness of breath.   Cardiovascular:  Negative for chest pain, palpitations and leg swelling.  Gastrointestinal:  Negative for abdominal pain, diarrhea, nausea and vomiting.  Genitourinary:  Negative for difficulty urinating and dysuria.  Musculoskeletal:  Negative for joint swelling and myalgias.  Skin:  Negative for color change and rash.  Neurological:  Negative for dizziness and headaches.  Hematological:  Negative for adenopathy. Does not bruise/bleed easily.  Psychiatric/Behavioral:  Negative for agitation and dysphoric mood.        Objective:     BP 130/70   Pulse 80   Temp 98.2 F (36.8 C)   Resp 16   Ht 5\' 4"  (1.626 m)   Wt 173 lb (78.5 kg)   SpO2 98%   BMI 29.70 kg/m  Wt Readings from Last 3 Encounters:  08/30/23 173 lb (78.5 kg)  04/12/23 174 lb 12.8 oz (79.3 kg)  02/22/23 175 lb (79.4 kg)    Physical Exam Vitals reviewed.  Constitutional:      General: She is not in acute distress.    Appearance: Normal appearance. She is well-developed.  HENT:     Head: Normocephalic and atraumatic.     Right Ear: External ear normal.     Left Ear: External ear normal.     Mouth/Throat:     Pharynx: No oropharyngeal exudate or posterior oropharyngeal erythema.  Eyes:     General: No scleral icterus.       Right eye: No discharge.        Left eye: No discharge.     Conjunctiva/sclera: Conjunctivae normal.  Neck:     Thyroid: No thyromegaly.  Cardiovascular:     Rate and Rhythm: Normal rate and regular rhythm.  Pulmonary:     Effort: No  tachypnea, accessory muscle usage or respiratory distress.     Breath sounds: Normal breath sounds. No decreased breath sounds or wheezing.  Chest:  Breasts:    Right: No inverted nipple, mass, nipple discharge or tenderness (no axillary adenopathy).     Left: No inverted nipple, mass, nipple discharge or tenderness (no axilarry adenopathy).  Abdominal:     General: Bowel sounds are normal.     Palpations: Abdomen is soft.     Tenderness:  There is no abdominal tenderness.  Musculoskeletal:        General: No swelling or tenderness.     Cervical back: Neck supple.  Lymphadenopathy:     Cervical: No cervical adenopathy.  Skin:    Findings: No erythema or rash.  Neurological:     Mental Status: She is alert and oriented to person, place, and time.  Psychiatric:        Mood and Affect: Mood normal.        Behavior: Behavior normal.         Outpatient Encounter Medications as of 08/30/2023  Medication Sig   bisoprolol (ZEBETA) 5 MG tablet Take 1 tablet (5 mg total) by mouth daily.   Calcium Carbonate-Vitamin D (CALTRATE 600+D PO) Take by mouth.   cephALEXin (KEFLEX) 250 MG capsule TAKE 1 CAPSULE BY MOUTH EVERY DAY   hydrochlorothiazide (MICROZIDE) 12.5 MG capsule Take 1 capsule (12.5 mg total) by mouth daily.   olmesartan (BENICAR) 40 MG tablet Take 1 tablet (40 mg total) by mouth daily.   Omega-3 Fatty Acids (FISH OIL) 1200 MG CAPS Take by mouth daily.   omeprazole (PRILOSEC) 20 MG capsule Take 1 capsule (20 mg total) by mouth 2 (two) times daily.   rosuvastatin (CRESTOR) 20 MG tablet Take 1 tablet (20 mg total) by mouth daily.   sertraline (ZOLOFT) 50 MG tablet Take 1.5 tablets (75 mg total) by mouth daily.   [DISCONTINUED] bisoprolol (ZEBETA) 5 MG tablet Take 1 tablet (5 mg total) by mouth daily.   [DISCONTINUED] cephALEXin (KEFLEX) 250 MG capsule TAKE 1 CAPSULE BY MOUTH EVERY DAY   [DISCONTINUED] hydrochlorothiazide (MICROZIDE) 12.5 MG capsule Take 1 capsule (12.5 mg total) by  mouth daily.   [DISCONTINUED] olmesartan (BENICAR) 40 MG tablet Take 1 tablet (40 mg total) by mouth daily.   [DISCONTINUED] omeprazole (PRILOSEC) 20 MG capsule Take 1 capsule (20 mg total) by mouth 2 (two) times daily.   [DISCONTINUED] rosuvastatin (CRESTOR) 20 MG tablet Take 1 tablet (20 mg total) by mouth daily.   [DISCONTINUED] sertraline (ZOLOFT) 50 MG tablet Take 1.5 tablets (75 mg total) by mouth daily.   No facility-administered encounter medications on file as of 08/30/2023.     Lab Results  Component Value Date   WBC 4.8 08/09/2023   HGB 13.0 08/09/2023   HCT 39.0 08/09/2023   PLT 190.0 08/09/2023   GLUCOSE 96 08/09/2023   CHOL 118 08/09/2023   TRIG 153.0 (H) 08/09/2023   HDL 43.00 08/09/2023   LDLDIRECT 83.0 05/30/2018   LDLCALC 45 08/09/2023   ALT 23 08/09/2023   AST 29 08/09/2023   NA 140 08/09/2023   K 3.7 08/09/2023   CL 101 08/09/2023   CREATININE 0.92 08/09/2023   BUN 21 08/09/2023   CO2 29 08/09/2023   TSH 4.03 08/09/2023   HGBA1C 6.1 08/09/2023    MM 3D SCREENING MAMMOGRAM BILATERAL BREAST Result Date: 10/27/2022 CLINICAL DATA:  Screening. EXAM: DIGITAL SCREENING BILATERAL MAMMOGRAM WITH TOMOSYNTHESIS AND CAD TECHNIQUE: Bilateral screening digital craniocaudal and mediolateral oblique mammograms were obtained. Bilateral screening digital breast tomosynthesis was performed. The images were evaluated with computer-aided detection. COMPARISON:  Previous exam(s). ACR Breast Density Category a: The breasts are almost entirely fatty. FINDINGS: There are no findings suspicious for malignancy. IMPRESSION: No mammographic evidence of malignancy. A result letter of this screening mammogram will be mailed directly to the patient. RECOMMENDATION: Screening mammogram in one year. (Code:SM-B-01Y) BI-RADS CATEGORY  1: Negative. Electronically Signed   By: Amanda Jungling  M.D.   On: 10/27/2022 15:37       Assessment & Plan:  Routine general medical examination at a health care  facility  Health care maintenance Assessment & Plan: Physical today 08/30/23.   Mammogram 10/26/22 - Birads I.  Colonoscopy - Three 6 mm polyps in the sigmoid colon and in the descending colon. Pathology: COLON POLYPS X 2, DESCENDING; COLD BIOPSY:  FRAGMENTS (X 3) OF TUBULAR ADENOMAS.  NEGATIVE FOR HIGH-GRADE DYSPLASIA AND MALIGNANCY.  COLON POLYP, SIGMOID; COLD BIOPSY:  TUBULAR ADENOMA.  NEGATIVE FOR HIGH-GRADE DYSPLASIA AND MALIGNANCY.   Essential hypertension, benign Assessment & Plan: Blood pressure doing well.  On benicar.  Follow pressures.  Follow metabolic panel. No changes in medication today.   Orders: -     Basic metabolic panel with GFR; Future  Hypercholesterolemia Assessment & Plan: On crestor 20mg  q day.  Low cholesterol diet and exercise.  Follow lipid panel and liver function tests.   Orders: -     Lipid panel; Future -     Hepatic function panel; Future  Hyperglycemia Assessment & Plan: Low carb diet and exercise. Follow met b and A1c.   Orders: -     Hemoglobin A1c; Future  Estrogen deficiency -     DG Bone Density; Future  Encounter for screening mammogram for malignant neoplasm of breast -     3D Screening Mammogram, Left and Right; Future  Obsessive-compulsive disorder, unspecified type Assessment & Plan: Continue zoloft. Doing well. No change in medication.    History of frequent urinary tract infections Assessment & Plan: Continues daily keflex.  Self caths.  Stable. Follow.    Stage 3a chronic kidney disease (HCC) Assessment & Plan: Avoid antiinflammatories.  Stay hydrated.  Follow metabolic panel.  Continue benicar. GFR 58. Stable.    Other orders -     Bisoprolol Fumarate; Take 1 tablet (5 mg total) by mouth daily.  Dispense: 90 tablet; Refill: 3 -     Cephalexin; TAKE 1 CAPSULE BY MOUTH EVERY DAY  Dispense: 90 capsule; Refill: 3 -     hydroCHLOROthiazide; Take 1 capsule (12.5 mg total) by mouth daily.  Dispense: 90 capsule; Refill: 3 -      Olmesartan Medoxomil; Take 1 tablet (40 mg total) by mouth daily.  Dispense: 90 tablet; Refill: 3 -     Omeprazole; Take 1 capsule (20 mg total) by mouth 2 (two) times daily.  Dispense: 180 capsule; Refill: 3 -     Rosuvastatin Calcium; Take 1 tablet (20 mg total) by mouth daily.  Dispense: 90 tablet; Refill: 3 -     Sertraline HCl; Take 1.5 tablets (75 mg total) by mouth daily.  Dispense: 135 tablet; Refill: 1     Dellar Fenton, MD

## 2023-08-30 NOTE — Assessment & Plan Note (Signed)
Continues daily keflex.  Self caths.  Stable.  Follow.  

## 2023-08-30 NOTE — Assessment & Plan Note (Signed)
 Blood pressure doing well.  On benicar.  Follow pressures.  Follow metabolic panel. No changes in medication today.

## 2023-08-30 NOTE — Assessment & Plan Note (Signed)
 Low-carb diet and exercise.  Follow met b and A1c.

## 2023-08-30 NOTE — Assessment & Plan Note (Signed)
On crestor 20mg q day.  Low cholesterol diet and exercise.  Follow lipid panel and liver function tests.   

## 2023-08-30 NOTE — Assessment & Plan Note (Signed)
 Avoid antiinflammatories.  Stay hydrated.  Follow metabolic panel.  Continue benicar. GFR 58. Stable.

## 2023-08-30 NOTE — Assessment & Plan Note (Signed)
 Physical today 08/30/23.   Mammogram 10/26/22 - Birads I.  Colonoscopy - Three 6 mm polyps in the sigmoid colon and in the descending colon. Pathology: COLON POLYPS X 2, DESCENDING; COLD BIOPSY:  FRAGMENTS (X 3) OF TUBULAR ADENOMAS.  NEGATIVE FOR HIGH-GRADE DYSPLASIA AND MALIGNANCY.  COLON POLYP, SIGMOID; COLD BIOPSY:  TUBULAR ADENOMA.  NEGATIVE FOR HIGH-GRADE DYSPLASIA AND MALIGNANCY.

## 2023-08-30 NOTE — Assessment & Plan Note (Signed)
 Continue zoloft. Doing well. No change in medication.

## 2023-09-02 DIAGNOSIS — H2511 Age-related nuclear cataract, right eye: Secondary | ICD-10-CM | POA: Diagnosis not present

## 2023-09-09 DIAGNOSIS — H2511 Age-related nuclear cataract, right eye: Secondary | ICD-10-CM | POA: Diagnosis not present

## 2023-09-16 DIAGNOSIS — D485 Neoplasm of uncertain behavior of skin: Secondary | ICD-10-CM | POA: Diagnosis not present

## 2023-09-16 DIAGNOSIS — C44519 Basal cell carcinoma of skin of other part of trunk: Secondary | ICD-10-CM | POA: Diagnosis not present

## 2023-09-16 DIAGNOSIS — Z85828 Personal history of other malignant neoplasm of skin: Secondary | ICD-10-CM | POA: Diagnosis not present

## 2023-09-16 DIAGNOSIS — C44329 Squamous cell carcinoma of skin of other parts of face: Secondary | ICD-10-CM | POA: Diagnosis not present

## 2023-09-16 DIAGNOSIS — D0439 Carcinoma in situ of skin of other parts of face: Secondary | ICD-10-CM | POA: Diagnosis not present

## 2023-09-16 DIAGNOSIS — L570 Actinic keratosis: Secondary | ICD-10-CM | POA: Diagnosis not present

## 2023-09-16 DIAGNOSIS — L821 Other seborrheic keratosis: Secondary | ICD-10-CM | POA: Diagnosis not present

## 2023-09-16 DIAGNOSIS — Z08 Encounter for follow-up examination after completed treatment for malignant neoplasm: Secondary | ICD-10-CM | POA: Diagnosis not present

## 2023-10-05 ENCOUNTER — Ambulatory Visit: Payer: Self-pay

## 2023-10-05 DIAGNOSIS — J069 Acute upper respiratory infection, unspecified: Secondary | ICD-10-CM | POA: Diagnosis not present

## 2023-10-05 DIAGNOSIS — Z03818 Encounter for observation for suspected exposure to other biological agents ruled out: Secondary | ICD-10-CM | POA: Diagnosis not present

## 2023-10-05 NOTE — Telephone Encounter (Signed)
 Copied from CRM [id number]. Topic: Clinical - Red Word Triage >> Oct 05, 2023  7:50 AM Emylou G wrote: [facility] that prompted transfer to Nurse Triage: Cold symptoms.. coughing deep and persistant - impacting her breath.. unable to sleep.. runny nose    Chief Complaint: Cough Symptoms: Runny nose, fever, headache, cough--dry at first but now is coughing up phlegm Frequency: 6 days Pertinent Negatives: Patient denies 100.3 Disposition: [] ED /[x] Urgent Care (no appt availability in office) / [] Appointment(In office/virtual)/ []  [facility]/ [] Home Care/ [] Refused Recommended Disposition /[] [facility]/ []  Follow-up with PCP Additional Notes: Patient called and advised that for the past 6 days she hasn't felt good and she tried to let it run it's course but she didn't sleep all night due to coughing. She states she did cough up some phlegm starting last night but she didn't know the color.  She also had a headache and is sore across her chest from coughing she states. Patient is advised by this RN that being seen today would be a good idea due to her symptoms and getting worse.  She agrees with this and was hoping to get an early appointment. Patient states she heard "rattling" all night. She took her temperature this morning and it was 100.3 and she did take some Tylenol. Patient was offered an appointment this afternoon at her PCP office -- The earliest appointment today at her PCP being 1:40 pm.  Patient wanted to go ahead and go to a nearby Urgent Care to go ahead and be evaluated and possibly get medication. Patient is advised that if anything worsens or she feels like she needs immediate care to go to the Emergency Room. Patient verbalized understanding.   Reason for Disposition  [1] Continuous (nonstop) coughing interferes with work or school AND [2] no improvement using cough treatment per Care Advice  Answer Assessment - Initial Assessment Questions 1. ONSET: "When  did the cough begin?"      6 days ago 2. SEVERITY: "How bad is the cough today?"      Kept her up all night 3. SPUTUM: "Describe the color of your sputum" (none, dry cough; clear, white, yellow, green)     She didn't look at it 4. HEMOPTYSIS: "Are you coughing up any blood?" If so ask: "How much?" (flecks, streaks, tablespoons, etc.)     No 5. DIFFICULTY BREATHING: "Are you having difficulty breathing?" If Yes, ask: "How bad is it?" (e.g., mild, moderate, severe)    - MILD: No SOB at rest, mild SOB with walking, speaks normally in sentences, can lie down, no retractions, pulse < 100.    - MODERATE: SOB at rest, SOB with minimal exertion and prefers to sit, cannot lie down flat, speaks in phrases, mild retractions, audible wheezing, pulse 100-120.    - SEVERE: Very SOB at rest, speaks in single words, struggling to breathe, sitting hunched forward, retractions, pulse > 120      mild 6. FEVER: "Do you have a fever?" If Yes, ask: "What is your temperature, how was it measured, and when did it start?"     Yes 100.3 at 7:30 am today (10/05/2023) 7. CARDIAC HISTORY: "Do you have any history of heart disease?" (e.g., heart attack, congestive heart failure)      No 8. LUNG HISTORY: "Do you have any history of lung disease?"  (e.g., pulmonary embolus, asthma, emphysema)     No 9. PE RISK FACTORS: "Do you have a history of blood clots?" (  or: recent major surgery, recent prolonged travel, bedridden)     No 10. OTHER SYMPTOMS: "Do you have any other symptoms?" (e.g., runny nose, wheezing, chest pain)       Runny nose,  12. TRAVEL: "Have you traveled out of the country in the last month?" (e.g., travel history, exposures)       No  Protocols used: Cough - Acute Productive-A-AH

## 2023-10-05 NOTE — Telephone Encounter (Signed)
 Noted

## 2023-10-07 ENCOUNTER — Inpatient Hospital Stay
Admission: EM | Admit: 2023-10-07 | Discharge: 2023-10-10 | DRG: 371 | Disposition: A | Discharge status: Home / Self Care | Attending: Internal Medicine | Admitting: Internal Medicine

## 2023-10-07 ENCOUNTER — Ambulatory Visit: Payer: Self-pay

## 2023-10-07 ENCOUNTER — Other Ambulatory Visit: Payer: Self-pay

## 2023-10-07 ENCOUNTER — Emergency Department

## 2023-10-07 DIAGNOSIS — I10 Essential (primary) hypertension: Secondary | ICD-10-CM | POA: Diagnosis present

## 2023-10-07 DIAGNOSIS — Z820 Family history of epilepsy and other diseases of the nervous system: Secondary | ICD-10-CM | POA: Diagnosis not present

## 2023-10-07 DIAGNOSIS — Z888 Allergy status to other drugs, medicaments and biological substances status: Secondary | ICD-10-CM | POA: Diagnosis not present

## 2023-10-07 DIAGNOSIS — Z1152 Encounter for screening for COVID-19: Secondary | ICD-10-CM | POA: Diagnosis not present

## 2023-10-07 DIAGNOSIS — R339 Retention of urine, unspecified: Secondary | ICD-10-CM | POA: Diagnosis present

## 2023-10-07 DIAGNOSIS — R918 Other nonspecific abnormal finding of lung field: Secondary | ICD-10-CM | POA: Diagnosis not present

## 2023-10-07 DIAGNOSIS — J189 Pneumonia, unspecified organism: Secondary | ICD-10-CM | POA: Diagnosis present

## 2023-10-07 DIAGNOSIS — E7800 Pure hypercholesterolemia, unspecified: Secondary | ICD-10-CM | POA: Diagnosis present

## 2023-10-07 DIAGNOSIS — Z79899 Other long term (current) drug therapy: Secondary | ICD-10-CM | POA: Diagnosis not present

## 2023-10-07 DIAGNOSIS — Z789 Other specified health status: Secondary | ICD-10-CM | POA: Insufficient documentation

## 2023-10-07 DIAGNOSIS — Z8249 Family history of ischemic heart disease and other diseases of the circulatory system: Secondary | ICD-10-CM | POA: Diagnosis not present

## 2023-10-07 DIAGNOSIS — J9601 Acute respiratory failure with hypoxia: Secondary | ICD-10-CM | POA: Diagnosis present

## 2023-10-07 DIAGNOSIS — R059 Cough, unspecified: Secondary | ICD-10-CM | POA: Diagnosis not present

## 2023-10-07 DIAGNOSIS — A045 Campylobacter enteritis: Principal | ICD-10-CM | POA: Diagnosis present

## 2023-10-07 DIAGNOSIS — R001 Bradycardia, unspecified: Secondary | ICD-10-CM | POA: Diagnosis present

## 2023-10-07 DIAGNOSIS — I2510 Atherosclerotic heart disease of native coronary artery without angina pectoris: Secondary | ICD-10-CM | POA: Diagnosis not present

## 2023-10-07 DIAGNOSIS — K219 Gastro-esophageal reflux disease without esophagitis: Secondary | ICD-10-CM | POA: Diagnosis present

## 2023-10-07 DIAGNOSIS — E876 Hypokalemia: Secondary | ICD-10-CM | POA: Diagnosis present

## 2023-10-07 DIAGNOSIS — J470 Bronchiectasis with acute lower respiratory infection: Secondary | ICD-10-CM | POA: Diagnosis present

## 2023-10-07 DIAGNOSIS — J479 Bronchiectasis, uncomplicated: Secondary | ICD-10-CM | POA: Diagnosis not present

## 2023-10-07 DIAGNOSIS — I700 Atherosclerosis of aorta: Secondary | ICD-10-CM | POA: Diagnosis not present

## 2023-10-07 LAB — COMPREHENSIVE METABOLIC PANEL WITH GFR
ALT: 20 U/L (ref 0–44)
AST: 27 U/L (ref 15–41)
Albumin: 4 g/dL (ref 3.5–5.0)
Alkaline Phosphatase: 68 U/L (ref 38–126)
Anion gap: 15 (ref 5–15)
BUN: 27 mg/dL — ABNORMAL HIGH (ref 8–23)
CO2: 23 mmol/L (ref 22–32)
Calcium: 9.3 mg/dL (ref 8.9–10.3)
Chloride: 98 mmol/L (ref 98–111)
Creatinine, Ser: 0.98 mg/dL (ref 0.44–1.00)
GFR, Estimated: 58 mL/min — ABNORMAL LOW (ref >60)
Glucose, Bld: 105 mg/dL — ABNORMAL HIGH (ref 70–99)
Potassium: 3 mmol/L — ABNORMAL LOW (ref 3.5–5.1)
Sodium: 136 mmol/L (ref 135–145)
Total Bilirubin: 1.3 mg/dL — ABNORMAL HIGH (ref 0.0–1.2)
Total Protein: 7.6 g/dL (ref 6.5–8.1)

## 2023-10-07 LAB — CBC
HCT: 37.2 % (ref 36.0–46.0)
Hemoglobin: 12.7 g/dL (ref 12.0–15.0)
MCH: 29.4 pg (ref 26.0–34.0)
MCHC: 34.1 g/dL (ref 30.0–36.0)
MCV: 86.1 fL (ref 80.0–100.0)
Platelets: 195 10*3/uL (ref 150–400)
RBC: 4.32 MIL/uL (ref 3.87–5.11)
RDW: 14.6 % (ref 11.5–15.5)
WBC: 12.1 10*3/uL — ABNORMAL HIGH (ref 4.0–10.5)
nRBC: 0 % (ref 0.0–0.2)

## 2023-10-07 LAB — RESP PANEL BY RT-PCR (RSV, FLU A&B, COVID)  RVPGX2
Influenza A by PCR: NEGATIVE
Influenza B by PCR: NEGATIVE
Resp Syncytial Virus by PCR: NEGATIVE
SARS Coronavirus 2 by RT PCR: NEGATIVE

## 2023-10-07 MED ORDER — ONDANSETRON HCL 4 MG/2ML IJ SOLN: 4.0000 mg | Freq: Four times a day (QID) | INTRAMUSCULAR | Status: DC | PRN

## 2023-10-07 MED ORDER — IPRATROPIUM-ALBUTEROL 0.5-2.5 (3) MG/3ML IN SOLN
3.0000 mL | Freq: Once | RESPIRATORY_TRACT | Status: AC
  Administered 2023-10-07: 3 mL via RESPIRATORY_TRACT
  Filled 2023-10-07: qty 3

## 2023-10-07 MED ORDER — PREDNISONE 20 MG PO TABS
50.0000 mg | ORAL_TABLET | Freq: Once | ORAL | Status: AC
  Administered 2023-10-07: 50 mg via ORAL
  Filled 2023-10-07: qty 2

## 2023-10-07 MED ORDER — ACETAMINOPHEN 325 MG PO TABS: 650.0000 mg | ORAL_TABLET | Freq: Four times a day (QID) | ORAL | Status: DC | PRN

## 2023-10-07 MED ORDER — SODIUM CHLORIDE 0.9 % IV SOLN
2.0000 g | INTRAVENOUS | Status: DC
  Administered 2023-10-08 – 2023-10-10 (×3): 2 g via INTRAVENOUS
  Filled 2023-10-07 (×3): qty 20

## 2023-10-07 MED ORDER — SODIUM CHLORIDE 0.9 % IV SOLN
500.0000 mg | INTRAVENOUS | Status: DC
  Administered 2023-10-08 – 2023-10-09 (×2): 500 mg via INTRAVENOUS
  Filled 2023-10-07 (×3): qty 5

## 2023-10-07 MED ORDER — ONDANSETRON HCL 4 MG PO TABS: 4.0000 mg | ORAL_TABLET | Freq: Four times a day (QID) | ORAL | Status: DC | PRN

## 2023-10-07 MED ORDER — GUAIFENESIN 100 MG/5ML PO LIQD
5.0000 mL | ORAL | Status: DC | PRN
  Administered 2023-10-07 – 2023-10-10 (×6): 5 mL via ORAL
  Filled 2023-10-07 (×7): qty 10

## 2023-10-07 MED ORDER — ACETAMINOPHEN 650 MG RE SUPP: 650.0000 mg | Freq: Four times a day (QID) | RECTAL | Status: DC | PRN

## 2023-10-07 MED ORDER — ENOXAPARIN SODIUM 40 MG/0.4ML IJ SOSY
40.0000 mg | PREFILLED_SYRINGE | INTRAMUSCULAR | Status: DC
  Administered 2023-10-07 – 2023-10-09 (×3): 40 mg via SUBCUTANEOUS
  Filled 2023-10-07 (×3): qty 0.4

## 2023-10-07 MED ORDER — SODIUM CHLORIDE 0.9 % IV SOLN
1.0000 g | Freq: Once | INTRAVENOUS | Status: AC
  Administered 2023-10-07: 1 g via INTRAVENOUS
  Filled 2023-10-07: qty 10

## 2023-10-07 MED ORDER — SENNOSIDES-DOCUSATE SODIUM 8.6-50 MG PO TABS: 1.0000 | ORAL_TABLET | Freq: Every evening | ORAL | Status: DC | PRN

## 2023-10-07 MED ORDER — SODIUM CHLORIDE 0.9 % IV SOLN
500.0000 mg | Freq: Once | INTRAVENOUS | Status: AC
  Administered 2023-10-07: 500 mg via INTRAVENOUS
  Filled 2023-10-07: qty 5

## 2023-10-07 MED ORDER — IOHEXOL 350 MG/ML SOLN
75.0000 mL | Freq: Once | INTRAVENOUS | Status: AC | PRN
  Administered 2023-10-07: 54 mL via INTRAVENOUS

## 2023-10-07 NOTE — ED Triage Notes (Addendum)
 Pt sts that she has been having cough, congestion and yellow diarrhea for the last seven days. Pt sts that she has called her PCP and he advised her to be seen.

## 2023-10-07 NOTE — ED Provider Notes (Signed)
 [facility] Provider Note    Event Date/Time   First MD Initiated Contact with Patient 10/07/23 1545     (approximate)   History   Cough, Nasal Congestion, and Diarrhea   HPI  Lindsey Harmon is a 78 y.o. female who presents with complaints of cough, mild shortness of breath, loose stools.  Patient reports she has been coughing for a week, she saw urgent care in the middle of the week and was put on 2 antibiotics as well as a cough medication but reports her symptoms continue.  She does not smoke.  No history of asthma.  No chest pain.  No pleurisy.  No lower extremity edema     Physical Exam   Triage Vital Signs: ED Triage Vitals  Encounter Vitals Group     BP 10/07/23 1426 (!) 128/54     Systolic BP Percentile --      Diastolic BP Percentile --      Pulse Rate 10/07/23 1426 71     Resp 10/07/23 1426 18     Temp 10/07/23 1426 98.6 F (37 C)     Temp Source 10/07/23 1426 Oral     SpO2 10/07/23 1426 95 %     Weight 10/07/23 1426 79.4 kg (175 lb)     Height 10/07/23 1426 1.626 m (5\' 4" )     Head Circumference --      Peak Flow --      Pain Score 10/07/23 1811 7     Pain Loc --      Pain Education --      Exclude from Growth Chart --     Most recent vital signs: Vitals:   10/07/23 1426 10/07/23 1812  BP: (!) 128/54 (!) 117/54  Pulse: 71 75  Resp: 18 18  Temp: 98.6 F (37 C)   SpO2: 95% 90%     General: Awake, no distress.  CV:  Good peripheral perfusion.  Resp:  Scattered mild wheezes, pulse ox noted to be low 90s Abd:  No distention.  Other:  No lower extremity edema   ED Results / Procedures / Treatments   Labs (all labs ordered are listed, but only abnormal results are displayed) Labs Reviewed  CBC - Abnormal; Notable for the following components:      Result Value   WBC 12.1 (*)    All other components within normal limits  COMPREHENSIVE METABOLIC PANEL WITH GFR - Abnormal; Notable for the following components:    Potassium 3.0 (*)    Glucose, Bld 105 (*)    BUN 27 (*)    Total Bilirubin 1.3 (*)    GFR, Estimated 58 (*)    All other components within normal limits  RESP PANEL BY RT-PCR (RSV, FLU A&B, COVID)  RVPGX2  CULTURE, BLOOD (ROUTINE X 2)  CULTURE, BLOOD (ROUTINE X 2)  CBC  BASIC METABOLIC PANEL WITH GFR     EKG     RADIOLOGY Chest x-ray viewed interpreted me, no evidence of pneumonia    PROCEDURES:  Critical Care performed: yes  CRITICAL CARE Performed by: Bryson Carbine   Total critical care time: 30 minutes  Critical care time was exclusive of separately billable procedures and treating other patients.  Critical care was necessary to treat or prevent imminent or life-threatening deterioration.  Critical care was time spent personally by me on the following activities: development of treatment plan with patient and/or surrogate as well as nursing, discussions with consultants, evaluation  of patient's response to treatment, examination of patient, obtaining history from patient or surrogate, ordering and performing treatments and interventions, ordering and review of laboratory studies, ordering and review of radiographic studies, pulse oximetry and re-evaluation of patient's condition.   Procedures   MEDICATIONS ORDERED IN ED: Medications  cefTRIAXone  (ROCEPHIN ) 1 g in sodium chloride  0.9 % 100 mL IVPB (has no administration in time range)  azithromycin  (ZITHROMAX ) 500 mg in sodium chloride  0.9 % 250 mL IVPB (has no administration in time range)  enoxaparin  (LOVENOX ) injection 40 mg (has no administration in time range)  cefTRIAXone  (ROCEPHIN ) 2 g in sodium chloride  0.9 % 100 mL IVPB (has no administration in time range)  azithromycin  (ZITHROMAX ) 500 mg in sodium chloride  0.9 % 250 mL IVPB (has no administration in time range)  acetaminophen  (TYLENOL ) tablet 650 mg (has no administration in time range)    Or  acetaminophen  (TYLENOL ) suppository 650 mg (has no  administration in time range)  senna-docusate (Senokot-S) tablet 1 tablet (has no administration in time range)  ondansetron  (ZOFRAN ) tablet 4 mg (has no administration in time range)    Or  ondansetron  (ZOFRAN ) injection 4 mg (has no administration in time range)  ipratropium-albuterol  (DUONEB) 0.5-2.5 (3) MG/3ML nebulizer solution 3 mL (3 mLs Nebulization Given 10/07/23 1719)  ipratropium-albuterol  (DUONEB) 0.5-2.5 (3) MG/3ML nebulizer solution 3 mL (3 mLs Nebulization Given 10/07/23 1719)  predniSONE  (DELTASONE ) tablet 50 mg (50 mg Oral Given 10/07/23 1809)  iohexol  (OMNIPAQUE ) 350 MG/ML injection 75 mL (54 mLs Intravenous Contrast Given 10/07/23 1843)     IMPRESSION / MDM / ASSESSMENT AND PLAN / ED COURSE  I reviewed the triage vital signs and the nursing notes. Patient's presentation is most consistent with acute presentation with potential threat to life or bodily function.  Patient presents with cough as detailed above, differential includes pneumonia, bronchospasm, less likely PE  Lab work demonstrates mild elevation of white blood cell count however she is afebrile.  Scattered wheezing on exam, treated with DuoNebs, p.o. prednisone   Noted to continue to be hypoxic however is requiring 2 L nasal cannula to keep saturations above 90%  Sent for CT angiography, consistent with multifocal pneumonia, ordered blood cultures, IV Rocephin , azithromycin   I have discussed with Dr. Vallarie Gauze of the hospitalist team for admission        FINAL CLINICAL IMPRESSION(S) / ED DIAGNOSES   Final diagnoses:  Community acquired pneumonia, unspecified laterality  Acute respiratory failure with hypoxia (HCC)     Rx / DC Orders   ED Discharge Orders     None        Note:  This document was prepared using Dragon voice recognition software and may include unintentional dictation errors.   Bryson Carbine, MD 10/07/23 Rainey Burden

## 2023-10-07 NOTE — ED Notes (Signed)
 MD Martina Sledge notified of the patients pain 7/10 headache.

## 2023-10-07 NOTE — ED Notes (Signed)
 ED Provider at bedside. Also MD is made aware of 88-91% RA.

## 2023-10-07 NOTE — Telephone Encounter (Signed)
Pt is currently at the ED

## 2023-10-07 NOTE — ED Notes (Signed)
 2L [place] placed and O2 increased from 90% to 95%

## 2023-10-07 NOTE — Telephone Encounter (Signed)
 Chief Complaint: cough Symptoms: cough, difficulty breathing with cough, diarrhea, decreased PO intake, chills, runny nose Frequency: pt seen 2 days ago at [facility] Pertinent Negatives: Patient denies fever, SOB without coughing, CP, vomiting Disposition: [x] ED /[] Urgent Care (no appt availability in office) / [] Appointment(In office/virtual)/ []  [facility]/ [] Home Care/ [] Refused Recommended Disposition /[] [facility]/ []  Follow-up with PCP Additional Notes: Pt reports cough with difficulty breathing only while coughing, diarrhea, runny nose, nausea, and decreased PO intake. Pt was seen for symptoms 2 days ago at the UC and prescribed two antibiotics, promethazine-dextromethorphan, and fluticasone  nasal spray and states she has not improved. Pt reports severe productive cough but states she does not know what her sputum looks like because she swallows it. Pt reports difficulty breathing but only with coughing. Pt is coughing frequently while on the phone. Pt endorses diarrhea for a couple days as well. Pt states yesterday she had diarrhea every time she coughed. Pt states her stools are yellow and very loose. Pt endorses soreness to her rectum from diarrhea. Pt endorses nausea but no vomiting. Pt states she is drinking plenty of water but has not had an appetite and is eating less.   RN advised pt she should go to the ED given severe cough and diarrhea with no improvement after being seen at [facility] (no office visits available today either). Pt agreeable to go, states her husband will take her. RN advised the pt if she develops worsening or has SOB without coughing, to call 911. Pt verbalized understanding.    Copied from CRM [id number]. Topic: Clinical - Red Word Triage >> Oct 07, 2023 12:04 PM Taleah C wrote: Red Word that prompted transfer to Nurse Triage: cough , sinus, fever, on going since 5/21. Reason for Disposition  Patient sounds very sick or weak to the triager  Answer  Assessment - Initial Assessment Questions 1. RESPIRATORY STATUS: "Describe your breathing?" (e.g., wheezing, shortness of breath, unable to speak, severe coughing)      Difficulty breathing with coughing  2. ONSET: "When did this breathing problem begin?"      Seen 2 days ago at UC 3. PATTERN "Does the difficult breathing come and go, or has it been constant since it started?"      Comes and goes, only with coughing  4. SEVERITY: "How bad is your breathing?" (e.g., mild, moderate, severe)    - MILD: No SOB at rest, mild SOB with walking, speaks normally in sentences, can lie down, no retractions, pulse < 100.    - MODERATE: SOB at rest, SOB with minimal exertion and prefers to sit, cannot lie down flat, speaks in phrases, mild retractions, audible wheezing, pulse 100-120.    - SEVERE: Very SOB at rest, speaks in single words, struggling to breathe, sitting hunched forward, retractions, pulse > 120      Difficulty breathing with coughing only  5. RECURRENT SYMPTOM: "Have you had difficulty breathing before?" If Yes, ask: "When was the last time?" and "What happened that time?"      When she had COVID several years ago  6. CARDIAC HISTORY: "Do you have any history of heart disease?" (e.g., heart attack, angina, bypass surgery, angioplasty)      HTN, CKD 7. LUNG HISTORY: "Do you have any history of lung disease?"  (e.g., pulmonary embolus, asthma, emphysema)     No  8. CAUSE: "What do you think is causing the breathing problem?"      N/A 9. OTHER SYMPTOMS: "Do  you have any other symptoms? (e.g., dizziness, runny nose, cough, chest pain, fever)     Endorses chills, has not checked her temperature. Endorses productive cough that is also sometimes dry. Pt does not know what sputum looks like. Endorses runny nose. Endorses nausea and diarrhea. Taking 2 antibiotics and promethazine and dextromethorphan, fluticasone . "Very loose" stool." Pt states her bottom is sore. Pt states she is not hungry, "it's  hard to eat anything when you're nauseated." Pt states she is drinking fluids, water. Pt states she ate cheese toast this AM w/ her meds. Pt states yesterday she had diarrhea every time she coughed. 10. O2 SATURATION MONITOR:  "Do you use an oxygen saturation monitor (pulse oximeter) at home?" If Yes, ask: "What is your reading (oxygen level) today?" "What is your usual oxygen saturation reading?" (e.g., 95%)       No  Protocols used: Breathing Difficulty-A-AH

## 2023-10-08 DIAGNOSIS — Z79899 Other long term (current) drug therapy: Secondary | ICD-10-CM | POA: Diagnosis not present

## 2023-10-08 DIAGNOSIS — I10 Essential (primary) hypertension: Secondary | ICD-10-CM

## 2023-10-08 DIAGNOSIS — Z820 Family history of epilepsy and other diseases of the nervous system: Secondary | ICD-10-CM | POA: Diagnosis not present

## 2023-10-08 DIAGNOSIS — A045 Campylobacter enteritis: Secondary | ICD-10-CM

## 2023-10-08 DIAGNOSIS — Z1152 Encounter for screening for COVID-19: Secondary | ICD-10-CM | POA: Diagnosis not present

## 2023-10-08 DIAGNOSIS — R001 Bradycardia, unspecified: Secondary | ICD-10-CM | POA: Diagnosis present

## 2023-10-08 DIAGNOSIS — R339 Retention of urine, unspecified: Secondary | ICD-10-CM

## 2023-10-08 DIAGNOSIS — J470 Bronchiectasis with acute lower respiratory infection: Secondary | ICD-10-CM | POA: Diagnosis present

## 2023-10-08 DIAGNOSIS — Z789 Other specified health status: Secondary | ICD-10-CM | POA: Insufficient documentation

## 2023-10-08 DIAGNOSIS — Z888 Allergy status to other drugs, medicaments and biological substances status: Secondary | ICD-10-CM | POA: Diagnosis not present

## 2023-10-08 DIAGNOSIS — E7800 Pure hypercholesterolemia, unspecified: Secondary | ICD-10-CM

## 2023-10-08 DIAGNOSIS — J189 Pneumonia, unspecified organism: Secondary | ICD-10-CM

## 2023-10-08 DIAGNOSIS — K219 Gastro-esophageal reflux disease without esophagitis: Secondary | ICD-10-CM | POA: Diagnosis present

## 2023-10-08 DIAGNOSIS — E876 Hypokalemia: Secondary | ICD-10-CM | POA: Diagnosis present

## 2023-10-08 DIAGNOSIS — J9601 Acute respiratory failure with hypoxia: Secondary | ICD-10-CM

## 2023-10-08 DIAGNOSIS — Z8249 Family history of ischemic heart disease and other diseases of the circulatory system: Secondary | ICD-10-CM | POA: Diagnosis not present

## 2023-10-08 LAB — GASTROINTESTINAL PANEL BY PCR, STOOL (REPLACES STOOL CULTURE)

## 2023-10-08 LAB — CBC
HCT: 33 % — ABNORMAL LOW (ref 36.0–46.0)
Hemoglobin: 11.1 g/dL — ABNORMAL LOW (ref 12.0–15.0)
MCH: 28.7 pg (ref 26.0–34.0)
MCHC: 33.6 g/dL (ref 30.0–36.0)
MCV: 85.3 fL (ref 80.0–100.0)
Platelets: 181 10*3/uL (ref 150–400)
RBC: 3.87 MIL/uL (ref 3.87–5.11)
RDW: 14.4 % (ref 11.5–15.5)
WBC: 6.3 10*3/uL (ref 4.0–10.5)
nRBC: 0 % (ref 0.0–0.2)

## 2023-10-08 LAB — BASIC METABOLIC PANEL WITH GFR
Anion gap: 12 (ref 5–15)
BUN: 25 mg/dL — ABNORMAL HIGH (ref 8–23)
CO2: 25 mmol/L (ref 22–32)
Calcium: 9 mg/dL (ref 8.9–10.3)
Chloride: 101 mmol/L (ref 98–111)
Creatinine, Ser: 0.84 mg/dL (ref 0.44–1.00)
GFR, Estimated: >60 mL/min (ref >60)
Glucose, Bld: 131 mg/dL — ABNORMAL HIGH (ref 70–99)
Potassium: 3 mmol/L — ABNORMAL LOW (ref 3.5–5.1)
Sodium: 138 mmol/L (ref 135–145)

## 2023-10-08 LAB — MAGNESIUM: Magnesium: 2.4 mg/dL (ref 1.7–2.4)

## 2023-10-08 LAB — C DIFFICILE QUICK SCREEN W PCR REFLEX
C Diff antigen: NEGATIVE
C Diff interpretation: NOT DETECTED
C Diff toxin: NEGATIVE

## 2023-10-08 LAB — GLUCOSE, CAPILLARY: Glucose-Capillary: 123 mg/dL — ABNORMAL HIGH (ref 70–99)

## 2023-10-08 MED ORDER — PANTOPRAZOLE SODIUM 40 MG PO TBEC
40.0000 mg | DELAYED_RELEASE_TABLET | Freq: Every day | ORAL | Status: DC
  Administered 2023-10-08 – 2023-10-10 (×3): 40 mg via ORAL
  Filled 2023-10-08 (×3): qty 1

## 2023-10-08 MED ORDER — LOPERAMIDE HCL 2 MG PO CAPS
4.0000 mg | ORAL_CAPSULE | ORAL | Status: DC | PRN
  Administered 2023-10-08: 4 mg via ORAL
  Filled 2023-10-08: qty 2

## 2023-10-08 MED ORDER — SERTRALINE HCL 50 MG PO TABS
75.0000 mg | ORAL_TABLET | Freq: Every day | ORAL | Status: DC
  Administered 2023-10-08 – 2023-10-10 (×3): 75 mg via ORAL
  Filled 2023-10-08 (×3): qty 2

## 2023-10-08 MED ORDER — PREDNISONE 20 MG PO TABS
40.0000 mg | ORAL_TABLET | Freq: Every day | ORAL | Status: DC
  Administered 2023-10-08 – 2023-10-10 (×3): 40 mg via ORAL
  Filled 2023-10-08 (×3): qty 2

## 2023-10-08 MED ORDER — POTASSIUM CHLORIDE CRYS ER 20 MEQ PO TBCR
40.0000 meq | EXTENDED_RELEASE_TABLET | Freq: Once | ORAL | Status: AC
  Administered 2023-10-08: 40 meq via ORAL
  Filled 2023-10-08: qty 2

## 2023-10-08 MED ORDER — ALBUTEROL SULFATE (2.5 MG/3ML) 0.083% IN NEBU: 2.5000 mg | INHALATION_SOLUTION | RESPIRATORY_TRACT | Status: DC | PRN

## 2023-10-08 MED ORDER — IRBESARTAN 150 MG PO TABS
300.0000 mg | ORAL_TABLET | Freq: Every day | ORAL | Status: DC
  Administered 2023-10-08 – 2023-10-10 (×3): 300 mg via ORAL
  Filled 2023-10-08 (×3): qty 2

## 2023-10-08 MED ORDER — IPRATROPIUM-ALBUTEROL 0.5-2.5 (3) MG/3ML IN SOLN
3.0000 mL | Freq: Four times a day (QID) | RESPIRATORY_TRACT | Status: DC
  Administered 2023-10-08 – 2023-10-09 (×3): 3 mL via RESPIRATORY_TRACT
  Filled 2023-10-08 (×3): qty 3

## 2023-10-08 MED ORDER — GERHARDT'S BUTT CREAM
TOPICAL_CREAM | Freq: Two times a day (BID) | CUTANEOUS | Status: DC
  Filled 2023-10-08: qty 60

## 2023-10-08 MED ORDER — ROSUVASTATIN CALCIUM 20 MG PO TABS
20.0000 mg | ORAL_TABLET | Freq: Every day | ORAL | Status: DC
  Administered 2023-10-08 – 2023-10-10 (×3): 20 mg via ORAL
  Filled 2023-10-08 (×2): qty 1
  Filled 2023-10-08: qty 2

## 2023-10-08 MED ORDER — MELATONIN 5 MG PO TABS
5.0000 mg | ORAL_TABLET | Freq: Every day | ORAL | Status: DC
  Administered 2023-10-08 – 2023-10-09 (×3): 5 mg via ORAL
  Filled 2023-10-08 (×3): qty 1

## 2023-10-08 MED ORDER — POTASSIUM CHLORIDE IN NACL 20-0.9 MEQ/L-% IV SOLN
INTRAVENOUS | Status: AC
  Filled 2023-10-08 (×2): qty 1000

## 2023-10-08 NOTE — Assessment & Plan Note (Deleted)
 Suspect related to recently started Augmentin Doubt infectious but we will get a GI panel and C. difficile

## 2023-10-08 NOTE — Assessment & Plan Note (Addendum)
 Urinary self-catheterization Patient allowed to self catheterize prn if willing otherwise nurse to catheterize Q6

## 2023-10-08 NOTE — Plan of Care (Signed)
  Problem: Clinical Measurements: Goal: Diagnostic test results will improve Outcome: Progressing   Problem: Activity: Goal: Risk for activity intolerance will decrease Outcome: Progressing   Problem: Nutrition: Goal: Adequate nutrition will be maintained Outcome: Progressing   Problem: Coping: Goal: Level of anxiety will decrease Outcome: Progressing   Problem: Safety: Goal: Ability to remain free from injury will improve Outcome: Progressing   Problem: Respiratory: Goal: Ability to maintain adequate ventilation will improve Outcome: Progressing

## 2023-10-08 NOTE — Assessment & Plan Note (Signed)
 Continue statin.

## 2023-10-08 NOTE — Assessment & Plan Note (Signed)
 Patient received Rocephin this morning.  Will switch over to Omnicef for tomorrow for 2 more days.  Patient will receive Zithromax  this evening at home orally.

## 2023-10-08 NOTE — Assessment & Plan Note (Signed)
 Zithromax  will cover.  Zithromax  prescribed upon discharge.  Patient states her diarrhea is less.  Can use as needed Imodium.

## 2023-10-08 NOTE — Progress Notes (Signed)
 Progress Note   Patient: Lindsey Harmon ZOX:[id number] DOB: 1945-09-09 DOA: 10/07/2023     0 DOS: the patient was seen and examined on 10/08/2023   Brief hospital course: 78 y.o. female with medical history significant for Urinary retention who self catheterizes, HTN, HLD, being admitted with community-acquired pneumonia.  Patient saw her PCP on 5/21 with cough and congestion, body aches and malaise and a fever to 101.9.  Respiratory viral panel was negative at the clinic and she was prescribed Augmentin and azithromycin .  After starting the antibiotics within a day she developed diarrhea and called her doctor who advised her to come into the ED to be evaluated.  She states that she is having several bowel accidents even with coughing and now has to wear a diaper.  She denies nausea, vomiting, abdominal pain chest pain, dysuria. ED course and data review: Afebrile with heart rate in the 70s.  Was reportedly hypoxic and placed on O2 at 2 L Labs notable for WBC 12,000 and potassium 3 Respiratory viral panel negative   CTA chest negative for PE but showing bilateral lower lobe bronchiectasis and patchy airspace opacities both lung bases concerning for aspiration pneumonitis or pneumonia.     Patient started on Rocephin and azithromycin  hospitalist consulted for admission.    5/24.  Stool culture positive for Campylobacter.  Zithromax  will cover.  Potassium low we will replace that IV and orally.   Assessment and Plan: * Multifocal pneumonia Continue Rocephin and Zithromax   Acute hypoxic respiratory failure (HCC) Pulse ox 88% on room air.  Taper oxygen as we get better breath sounds.  Will give a dose of prednisone  today he already received prednisone  in the emergency room.  Nebulizer treatments.  Campylobacter diarrhea Zithromax  will cover  Hypokalemia Replace potassium orally and will add IV fluids.  Urinary retention, chronic Urinary self-catheterization Patient allowed to self  catheterize prn if willing otherwise nurse to catheterize Q6  Hypercholesterolemia Continue statin  Essential hypertension, benign Hold bisoprolol  with bradycardia.  Hold hydrochlorothiazide  with hypokalemia.  Continue Avapro.        Subjective: Patient having diarrhea.  Nobody else at home is sick.  Found to have Campylobacter.  Initially thought to be secondary to antibiotics.  Admitted with pneumonia also.  Patient having diarrhea every time she coughs.  Physical Exam: Vitals:   10/07/23 1930 10/07/23 2027 10/08/23 0444 10/08/23 0812  BP: 123/62 (!) 121/51 (!) 123/47 (!) 127/44  Pulse: 66 78 (!) 41 60  Resp:  18 18 18   Temp:  98.4 F (36.9 C) 98.1 F (36.7 C) 97.9 F (36.6 C)  TempSrc:    Oral  SpO2: 94% 96% 97% 94%  Weight:      Height:       Physical Exam HENT:     Head: Normocephalic.     Mouth/Throat:     Pharynx: No oropharyngeal exudate.  Eyes:     General: Lids are normal.     Conjunctiva/sclera: Conjunctivae normal.  Cardiovascular:     Rate and Rhythm: Normal rate and regular rhythm.     Heart sounds: Normal heart sounds, S1 normal and S2 normal.  Pulmonary:     Breath sounds: Examination of the right-lower field reveals decreased breath sounds and rhonchi. Examination of the left-lower field reveals decreased breath sounds and rhonchi. Decreased breath sounds and rhonchi present. No wheezing or rales.  Abdominal:     Palpations: Abdomen is soft.     Tenderness: There is no abdominal tenderness.  Musculoskeletal:     Right lower leg: No swelling.     Left lower leg: No swelling.  Skin:    General: Skin is warm.     Findings: No rash.  Neurological:     Mental Status: She is alert and oriented to person, place, and time.     Data Reviewed: Potassium 3.0, creatinine 0.84, white blood count 6.3, hemoglobin 11.1, platelet count 181.  Family Communication: Spoke with husband at the bedside this morning and daughter on the phone late  morning  Disposition: Status is: Observation Patient admitted with pneumonia and diarrhea.  Found to have Campylobacter in the stool.  Already on the treatment for this with Zithromax .  Planned Discharge Destination: Home    Time spent: 28 minutes  Author: Verla Glaze, MD 10/08/2023 11:13 AM  For on call review [email or web address].

## 2023-10-08 NOTE — Assessment & Plan Note (Signed)
 Pulse ox 88% on room air.  Taper oxygen as we get better breath sounds.  Will give a dose of prednisone  today he already received prednisone  in the emergency room.  Nebulizer treatments.

## 2023-10-08 NOTE — Assessment & Plan Note (Signed)
 Replace potassium orally and will add IV fluids.

## 2023-10-08 NOTE — Hospital Course (Signed)
 78 y.o. female with medical history significant for Urinary retention who self catheterizes, HTN, HLD, being admitted with community-acquired pneumonia.  Patient saw her PCP on 5/21 with cough and congestion, body aches and malaise and a fever to 101.9.  Respiratory viral panel was negative at the clinic and she was prescribed Augmentin and azithromycin .  After starting the antibiotics within a day she developed diarrhea and called her doctor who advised her to come into the ED to be evaluated.  She states that she is having several bowel accidents even with coughing and now has to wear a diaper.  She denies nausea, vomiting, abdominal pain chest pain, dysuria. ED course and data review: Afebrile with heart rate in the 70s.  Was reportedly hypoxic and placed on O2 at 2 L Labs notable for WBC 12,000 and potassium 3 Respiratory viral panel negative   CTA chest negative for PE but showing bilateral lower lobe bronchiectasis and patchy airspace opacities both lung bases concerning for aspiration pneumonitis or pneumonia.     Patient started on Rocephin and azithromycin  hospitalist consulted for admission.    5/24.  Stool culture positive for Campylobacter.  Zithromax  will cover.  Potassium low we will replace that IV and orally. 5/25.  Patient had another 3 bowel movements today and 5 documented yesterday.  Will give a dose of Imodium.  Continue Zithromax . 5/26.  Patient states her diarrhea has settled down and she wants to go home.  Advise she must eat and stay hydrated.  Continue Zithromax  and switch Rocephin over to Omnicef.

## 2023-10-08 NOTE — Assessment & Plan Note (Addendum)
 Hold bisoprolol  with bradycardia.  Hold hydrochlorothiazide  with hypokalemia.  Continue Avapro.

## 2023-10-08 NOTE — Hospital Course (Signed)
 Lindsey Harmon

## 2023-10-08 NOTE — Assessment & Plan Note (Deleted)
 Hypoxia Continue Rocephin and azithromycin  Antitussives Albuterol as needed Supplemental oxygen to keep sats over 94%

## 2023-10-08 NOTE — H&P (Addendum)
 History and Physical    Patient: Lindsey Harmon ZOX:[id number] DOB: [birth year] DOA: 10/07/2023 DOS: the patient was seen and examined on 10/08/2023 PCP: Dellar Fenton, MD  Patient coming from: Home  Chief Complaint:  Chief Complaint  Patient presents with   Cough   Nasal Congestion   Diarrhea    HPI: Lindsey Harmon is a 78 y.o. female with medical history significant for Urinary retention who self catheterizes, HTN, HLD, being admitted with community-acquired pneumonia.  Patient saw her PCP on 5/21 with cough and congestion, body aches and malaise and a fever to 101.9.  Respiratory viral panel was negative at the clinic and she was prescribed Augmentin and azithromycin .  After starting the antibiotics within a day she developed diarrhea and called her doctor who advised her to come into the ED to be evaluated.  She states that she is having several bowel accidents even with coughing and now has to wear a diaper.  She denies nausea, vomiting, abdominal pain chest pain, dysuria. ED course and data review: Afebrile with heart rate in the 70s.  Was reportedly hypoxic and placed on O2 at 2 L Labs notable for WBC 12,000 and potassium 3 Respiratory viral panel negative  CTA chest negative for PE but showing bilateral lower lobe bronchiectasis and patchy airspace opacities both lung bases concerning for aspiration pneumonitis or pneumonia.   Patient started on Rocephin and azithromycin  hospitalist consulted for admission.      Review of Systems: As mentioned in the history of present illness. All other systems reviewed and are negative.  Past Medical History:  Diagnosis Date   Abnormal liver function    Cystocele, unspecified (CODE)    Fibrocystic breast disease    GERD (gastroesophageal reflux disease)    Hypertension    Intrinsic sphincter deficiency    Pelvic relaxation    Pure hypercholesterolemia    Rectocele    SUI (stress urinary incontinence, female)    Urinary retention     Urinary retention    Past Surgical History:  Procedure Laterality Date   ABDOMINAL HYSTERECTOMY     APPENDECTOMY     Bladder Tack     burch urethropexy  10/24/2000   laparoscopic colpopexy/culdoplasty   COLONOSCOPY WITH PROPOFOL  N/A 08/10/2016   Procedure: COLONOSCOPY WITH PROPOFOL ;  Surgeon: Deveron Fly, MD;  Location: [facility] ENDOSCOPY;  Service: Endoscopy;  Laterality: N/A;   COLONOSCOPY WITH PROPOFOL  N/A 10/20/2016   Procedure: COLONOSCOPY WITH PROPOFOL ;  Surgeon: Marshall Skeeter, MD;  Location: [facility] ENDOSCOPY;  Service: Endoscopy;  Laterality: N/A;   COLONOSCOPY WITH PROPOFOL  N/A 06/01/2017   Procedure: COLONOSCOPY WITH PROPOFOL ;  Surgeon: Marshall Skeeter, MD;  Location: [facility] ENDOSCOPY;  Service: Endoscopy;  Laterality: N/A;   COLONOSCOPY WITH PROPOFOL  N/A 12/23/2021   Procedure: COLONOSCOPY WITH PROPOFOL ;  Surgeon: Marshall Skeeter, MD;  Location: [facility] ENDOSCOPY;  Service: Endoscopy;  Laterality: N/A;   COLPORRHAPHY     forv repair cystocele anterior   TRANSVAGINAL TAPE (TVT) REMOVAL     Social History:  reports that she has never smoked. She has never used smokeless tobacco. She reports that she does not drink alcohol and does not use drugs.  Allergies  Allergen Reactions   Ace Inhibitors Cough    cough    Family History  Problem Relation Age of Onset   Pneumonia Mother        died of presumed aspiration pneumonia   Hypertension Mother    Cancer Father  prostate   Heart disease Paternal Grandfather        myocardial infarction   Alzheimer's disease Brother    Breast cancer Neg Hx     Prior to Admission medications   Medication Sig Start Date End Date Taking? Authorizing Provider  bisoprolol  (ZEBETA ) 5 MG tablet Take 1 tablet (5 mg total) by mouth daily. 08/30/23   Dellar Fenton, MD  Calcium  Carbonate-Vitamin D (CALTRATE 600+D PO) Take by mouth.    [provider]  cephALEXin  (KEFLEX ) 250 MG capsule TAKE 1 CAPSULE BY MOUTH EVERY DAY 08/30/23    Dellar Fenton, MD  hydrochlorothiazide  (MICROZIDE ) 12.5 MG capsule Take 1 capsule (12.5 mg total) by mouth daily. 08/30/23   Dellar Fenton, MD  olmesartan  (BENICAR ) 40 MG tablet Take 1 tablet (40 mg total) by mouth daily. 08/30/23   Dellar Fenton, MD  Omega-3 Fatty Acids (FISH OIL) 1200 MG CAPS Take by mouth daily.    [provider]  omeprazole  (PRILOSEC) 20 MG capsule Take 1 capsule (20 mg total) by mouth 2 (two) times daily. 08/30/23   Dellar Fenton, MD  rosuvastatin  (CRESTOR ) 20 MG tablet Take 1 tablet (20 mg total) by mouth daily. 08/30/23   Dellar Fenton, MD  sertraline  (ZOLOFT ) 50 MG tablet Take 1.5 tablets (75 mg total) by mouth daily. 08/30/23   Dellar Fenton, MD    Physical Exam: Vitals:   10/07/23 1426 10/07/23 1812 10/07/23 1930 10/07/23 2027  BP: (!) 128/54 (!) 117/54 123/62 (!) 121/51  Pulse: 71 75 66 78  Resp: 18 18  18   Temp: 98.6 F (37 C)   98.4 F (36.9 C)  TempSrc: Oral     SpO2: 95% 90% 94% 96%  Weight: 79.4 kg     Height: 5\' 4"  (1.626 m)      Physical Exam Vitals and nursing note reviewed.  Constitutional:      General: She is not in acute distress. HENT:     Head: Normocephalic and atraumatic.  Cardiovascular:     Rate and Rhythm: Normal rate and regular rhythm.     Heart sounds: Normal heart sounds.  Pulmonary:     Effort: Pulmonary effort is normal.     Breath sounds: Normal breath sounds.  Abdominal:     Palpations: Abdomen is soft.     Tenderness: There is no abdominal tenderness.  Neurological:     Mental Status: Mental status is at baseline.     Labs on Admission: I have personally reviewed following labs and imaging studies  CBC: Recent Labs  Lab 10/07/23 1428  WBC 12.1*  HGB 12.7  HCT 37.2  MCV 86.1  PLT 195   Basic Metabolic Panel: Recent Labs  Lab 10/07/23 1428  NA 136  K 3.0*  CL 98  CO2 23  GLUCOSE 105*  BUN 27*  CREATININE 0.98  CALCIUM  9.3   GFR: Estimated Creatinine Clearance: 46.7 mL/min (by  C-G formula based on SCr of 0.98 mg/dL). Liver Function Tests: Recent Labs  Lab 10/07/23 1428  AST 27  ALT 20  ALKPHOS 68  BILITOT 1.3*  PROT 7.6  ALBUMIN 4.0   No results for input(s): "LIPASE", "AMYLASE" in the last 168 hours. No results for input(s): "AMMONIA" in the last 168 hours. Coagulation Profile: No results for input(s): "INR", "PROTIME" in the last 168 hours. Cardiac Enzymes: No results for input(s): "CKTOTAL", "CKMB", "CKMBINDEX", "TROPONINI" in the last 168 hours. BNP (last 3 results) No results for input(s): "PROBNP" in the last 8760 hours.  HbA1C: No results for input(s): "HGBA1C" in the last 72 hours. CBG: No results for input(s): "GLUCAP" in the last 168 hours. Lipid Profile: No results for input(s): "CHOL", "HDL", "LDLCALC", "TRIG", "CHOLHDL", "LDLDIRECT" in the last 72 hours. Thyroid  Function Tests: No results for input(s): "TSH", "T4TOTAL", "FREET4", "T3FREE", "THYROIDAB" in the last 72 hours. Anemia Panel: No results for input(s): "VITAMINB12", "FOLATE", "FERRITIN", "TIBC", "IRON", "RETICCTPCT" in the last 72 hours. Urine analysis:    Component Value Date/Time   COLORURINE YELLOW 06/01/2018 1046   APPEARANCEUR CLEAR 06/01/2018 1046   LABSPEC 1.020 06/01/2018 1046   PHURINE 6.0 06/01/2018 1046   GLUCOSEU NEGATIVE 06/01/2018 1046   HGBUR NEGATIVE 06/01/2018 1046   BILIRUBINUR small 12/19/2018 1146   KETONESUR NEGATIVE 06/01/2018 1046   PROTEINUR Positive (A) 12/19/2018 1146   UROBILINOGEN 0.2 12/19/2018 1146   UROBILINOGEN 0.2 06/01/2018 1046   NITRITE neg 12/19/2018 1146   NITRITE NEGATIVE 06/01/2018 1046   LEUKOCYTESUR Trace (A) 12/19/2018 1146    Radiological Exams on Admission: CT Angio Chest PE W and/or Wo Contrast Result Date: 10/07/2023 EXAM: CTA of the Chest with contrast for PE 10/07/2023 06:50:38 PM TECHNIQUE: CTA of the chest was performed after the administration of 54mL iohexol (OMNIPAQUE) 350 MG/ML injection. Multiplanar reformatted  images are provided for review. MIP images are provided for review. Automated exposure control, iterative reconstruction, and/or weight based adjustment of the mA/kV was utilized to reduce the radiation dose to as low as reasonably achievable. COMPARISON: Chest x-ray 10/07/2023 2 view CLINICAL HISTORY: Pulmonary embolism (PE) suspected, high prob. Cough, congestion and yellow diarrhea for the last seven days. FINDINGS: PULMONARY ARTERIES: Pulmonary arteries are adequately opacified for evaluation. No pulmonary embolism. Main pulmonary artery is normal in caliber. MEDIASTINUM: The heart and pericardium demonstrate no acute abnormality. Atherosclerotic changes are present at the distal aortic arch and descending thoracic aorta. No aneurysm or stenosis is present. Coronary artery calcifications are present. A large hiatal hernia is present. LYMPH NODES: No mediastinal, hilar or axillary lymphadenopathy. LUNGS AND PLEURA: Bilateral lower lobe bronchiectasis is noted. Patchy airspace opacities are present at both lung bases. No evidence of pleural effusion or pneumothorax. UPPER ABDOMEN: Limited images of the upper abdomen are unremarkable. SOFT TISSUES AND BONES: No acute bone or soft tissue abnormality. IMPRESSION: 1. No evidence of pulmonary embolism. 2. Bilateral lower lobe bronchiectasis and patchy airspace opacities at both lung bases. Findings are concerning for aspiration pneumonitis or pneumonia. 3. Large hiatal hernia. Electronically signed by: Audree Leas MD 10/07/2023 08:14 PM EDT RP Workstation: [id number]   DG Chest 2 View Result Date: 10/07/2023 CLINICAL DATA:  Cough congestion EXAM: CHEST - 2 VIEW COMPARISON:  None Available. FINDINGS: The heart size and mediastinal contours are within normal limits. Aortic atherosclerosis. Both lungs are clear. The visualized skeletal structures are unremarkable. Retrocardiac opacity likely reflects a hiatal hernia. IMPRESSION: No active cardiopulmonary  disease. Probable hiatal hernia. Electronically Signed   By: Esmeralda Hedge M.D.   On: 10/07/2023 17:06   Data Reviewed for HPI: Relevant notes from primary care and specialist visits, past discharge summaries as available in EHR, including Care Everywhere. Prior diagnostic testing as pertinent to current admission diagnoses Updated medications and problem lists for reconciliation ED course, including vitals, labs, imaging, treatment and response to treatment Triage notes, nursing and pharmacy notes and ED provider's notes Notable results as noted above in HPI      Assessment and Plan: * CAP (community acquired pneumonia) Hypoxia Continue Rocephin and azithromycin  Antitussives Albuterol as  needed Supplemental oxygen to keep sats over 94%  Diarrhea Suspect related to recently started Augmentin Doubt infectious but we will get a GI panel and C. difficile  Urinary retention, chronic Urinary self-catheterization Patient allowed to self catheterize prn if willing otherwise nurse to catheterize Q6  Hypercholesterolemia Continue statin  Essential hypertension, benign Continue home bisoprolol  and olmesartan  and hydrochlorothiazide      DVT prophylaxis: Lovenox  Consults: none  Advance Care Planning:   Code Status: Full Code   Family Communication: none  Disposition Plan: Back to previous home environment  Severity of Illness: The appropriate patient status for this patient is OBSERVATION. Observation status is judged to be reasonable and necessary in order to provide the required intensity of service to ensure the patient's safety. The patient's presenting symptoms, physical exam findings, and initial radiographic and laboratory data in the context of their medical condition is felt to place them at decreased risk for further clinical deterioration. Furthermore, it is anticipated that the patient will be medically stable for discharge from the hospital within 2 midnights of  admission.   Author: Lanetta Pion, MD 10/08/2023 12:46 AM  For on call review [email or web address].

## 2023-10-09 DIAGNOSIS — J189 Pneumonia, unspecified organism: Secondary | ICD-10-CM | POA: Diagnosis not present

## 2023-10-09 DIAGNOSIS — J9601 Acute respiratory failure with hypoxia: Secondary | ICD-10-CM | POA: Diagnosis not present

## 2023-10-09 DIAGNOSIS — E876 Hypokalemia: Secondary | ICD-10-CM | POA: Diagnosis not present

## 2023-10-09 DIAGNOSIS — A045 Campylobacter enteritis: Secondary | ICD-10-CM | POA: Diagnosis not present

## 2023-10-09 LAB — CBC
HCT: 30.8 % — ABNORMAL LOW (ref 36.0–46.0)
Hemoglobin: 10.3 g/dL — ABNORMAL LOW (ref 12.0–15.0)
MCH: 28.9 pg (ref 26.0–34.0)
MCHC: 33.4 g/dL (ref 30.0–36.0)
MCV: 86.5 fL (ref 80.0–100.0)
Platelets: 186 10*3/uL (ref 150–400)
RBC: 3.56 MIL/uL — ABNORMAL LOW (ref 3.87–5.11)
RDW: 14.6 % (ref 11.5–15.5)
WBC: 8 10*3/uL (ref 4.0–10.5)
nRBC: 0 % (ref 0.0–0.2)

## 2023-10-09 LAB — BASIC METABOLIC PANEL WITH GFR
Anion gap: 12 (ref 5–15)
BUN: 24 mg/dL — ABNORMAL HIGH (ref 8–23)
CO2: 24 mmol/L (ref 22–32)
Calcium: 8.8 mg/dL — ABNORMAL LOW (ref 8.9–10.3)
Chloride: 104 mmol/L (ref 98–111)
Creatinine, Ser: 0.9 mg/dL (ref 0.44–1.00)
GFR, Estimated: >60 mL/min (ref >60)
Glucose, Bld: 121 mg/dL — ABNORMAL HIGH (ref 70–99)
Potassium: 3.2 mmol/L — ABNORMAL LOW (ref 3.5–5.1)
Sodium: 140 mmol/L (ref 135–145)

## 2023-10-09 MED ORDER — IPRATROPIUM-ALBUTEROL 0.5-2.5 (3) MG/3ML IN SOLN
3.0000 mL | Freq: Three times a day (TID) | RESPIRATORY_TRACT | Status: DC
  Administered 2023-10-09 – 2023-10-10 (×3): 3 mL via RESPIRATORY_TRACT
  Filled 2023-10-09 (×3): qty 3

## 2023-10-09 MED ORDER — POTASSIUM CHLORIDE IN NACL 20-0.9 MEQ/L-% IV SOLN
INTRAVENOUS | Status: DC
  Filled 2023-10-09 (×2): qty 1000

## 2023-10-09 MED ORDER — POTASSIUM CHLORIDE CRYS ER 20 MEQ PO TBCR
40.0000 meq | EXTENDED_RELEASE_TABLET | Freq: Once | ORAL | Status: AC
  Administered 2023-10-09: 40 meq via ORAL
  Filled 2023-10-09: qty 2

## 2023-10-09 MED ORDER — LOPERAMIDE HCL 2 MG PO CAPS
4.0000 mg | ORAL_CAPSULE | Freq: Once | ORAL | Status: AC
  Administered 2023-10-09: 4 mg via ORAL
  Filled 2023-10-09: qty 2

## 2023-10-09 NOTE — TOC Initial Note (Signed)
 Transition of Care [facility]) - Initial/Assessment Note    Patient Details  Name: Lindsey Harmon MRN: [id number] Date of Birth: [birth year]  Transition of [facility]) CM/SW Contact:    Alexandra Ice, RN Phone Number: 10/09/2023, 4:03 PM  Clinical Narrative:                  Patient lives with spouse, independent with ADLs. Has PCP in the community. No TOC needs identified at this time, will continue to follow.        Patient Goals and CMS Choice            Expected Discharge Plan and Services                                              Prior Living Arrangements/Services                       Activities of Daily Living   ADL Screening (condition at time of admission) Independently performs ADLs?: Yes (appropriate for developmental age) Is the patient deaf or have difficulty hearing?: No Does the patient have difficulty seeing, even when wearing glasses/contacts?: No Does the patient have difficulty concentrating, remembering, or making decisions?: No  Permission Sought/Granted                  Emotional Assessment              Admission diagnosis:  CAP (community acquired pneumonia) [J18.9] Acute respiratory failure with hypoxia (HCC) [J96.01] Community acquired pneumonia, unspecified laterality [J18.9] Multifocal pneumonia [J18.9] Patient Active Problem List   Diagnosis Date Noted   Self-catheterizes urinary bladder 10/08/2023   Multifocal pneumonia 10/08/2023   Acute hypoxic respiratory failure (HCC) 10/08/2023   Campylobacter diarrhea 10/08/2023   Hypokalemia 10/08/2023   Urinary retention, chronic    Left knee pain 04/16/2023   Right knee pain 12/08/2022   History of excessive cerumen 12/08/2022   Nasal drainage 07/05/2021   Cough 06/24/2019   Elevated TSH 12/17/2018   History of colon polyps 08/16/2016   Colon cancer screening 05/23/2016   CKD (chronic kidney disease) stage 3, GFR 30-59 ml/min (HCC) 02/04/2015    Health care maintenance 10/14/2014   Hyperglycemia 10/03/2014   BMI 32.0-32.9,adult 06/02/2014   Renal insufficiency 12/23/2013   Bradycardia 06/03/2013   History of frequent urinary tract infections 02/02/2013   Obsessive compulsive disorder 02/02/2013   Essential hypertension, benign 08/07/2012   Hypercholesterolemia 08/07/2012   Abnormal liver function 08/07/2012   PCP:  Dellar Fenton, MD Pharmacy:   [facility] [phone] - [place], [place] - [address] [place] [address] Phone: [phone] Fax: [phone]     Social Drivers of Health (SDOH) Social History: SDOH Screenings   Food Insecurity: No Food Insecurity (10/07/2023)  Housing: Low Risk  (10/07/2023)  Transportation Needs: No Transportation Needs (10/07/2023)  Utilities: Not At Risk (10/07/2023)  Alcohol Screen: Low Risk  (02/19/2023)  Depression (PHQ2-9): Low Risk  (02/22/2023)  Financial Resource Strain: Low Risk  (08/26/2023)  Physical Activity: Unknown (08/26/2023)  Social Connections: Socially Integrated (10/07/2023)  Stress: No Stress Concern Present (08/26/2023)  Tobacco Use: Low Risk  (10/07/2023)  Health Literacy: Adequate Health Literacy (02/22/2023)   SDOH Interventions:     Readmission Risk Interventions     No data to display

## 2023-10-09 NOTE — Progress Notes (Signed)
 Progress Note   Patient: Lindsey Harmon XBJ:[id number] DOB: 1946/05/17 DOA: 10/07/2023     1 DOS: the patient was seen and examined on 10/09/2023   Brief hospital course: 78 y.o. female with medical history significant for Urinary retention who self catheterizes, HTN, HLD, being admitted with community-acquired pneumonia.  Patient saw her PCP on 5/21 with cough and congestion, body aches and malaise and a fever to 101.9.  Respiratory viral panel was negative at the clinic and she was prescribed Augmentin and azithromycin .  After starting the antibiotics within a day she developed diarrhea and called her doctor who advised her to come into the ED to be evaluated.  She states that she is having several bowel accidents even with coughing and now has to wear a diaper.  She denies nausea, vomiting, abdominal pain chest pain, dysuria. ED course and data review: Afebrile with heart rate in the 70s.  Was reportedly hypoxic and placed on O2 at 2 L Labs notable for WBC 12,000 and potassium 3 Respiratory viral panel negative   CTA chest negative for PE but showing bilateral lower lobe bronchiectasis and patchy airspace opacities both lung bases concerning for aspiration pneumonitis or pneumonia.     Patient started on Rocephin  and azithromycin  hospitalist consulted for admission.    5/24.  Stool culture positive for Campylobacter.  Zithromax  will cover.  Potassium low we will replace that IV and orally. 5/25.  Patient had another 3 bowel movements today and 5 documented yesterday.  Will give a dose of Imodium .  Continue Zithromax .  Assessment and Plan: * Campylobacter diarrhea Zithromax  will cover.  Patient still having quite a bit of diarrhea.  Will restart fluids today.  Give a dose of Imodium .  Multifocal pneumonia Continue Rocephin  and Zithromax   Acute hypoxic respiratory failure (HCC) Pulse ox 88% on room air.  Patient off oxygen  Hypokalemia Replace potassium orally and in IV fluids  Urinary  retention, chronic Urinary self-catheterization Patient allowed to self catheterize prn if willing otherwise nurse to catheterize Q6  Hypercholesterolemia Continue statin  Essential hypertension, benign Hold bisoprolol  with bradycardia.  Hold hydrochlorothiazide  with hypokalemia.  Continue Avapro .        Subjective:  Patient had 5 documented bowel movements yesterday and another 3 bowel movements so far today.  Patient feels better but hoping for the diarrhea to slow down a bit.  Will give a dose of Imodium .  Continue treatment for Campylobacter gastroenteritis. Physical Exam: Vitals:   10/08/23 1513 10/08/23 2033 10/09/23 0405 10/09/23 0726  BP: (!) 141/61 130/60 (!) 135/50 (!) 124/41  Pulse: 66 72 64 60  Resp: 18 18 18 16   Temp: 97.8 F (36.6 C) 98 F (36.7 C) 98.4 F (36.9 C) 97.7 F (36.5 C)  TempSrc: Oral  Oral Oral  SpO2: 93% 94% 93% 99%  Weight:      Height:       Physical Exam HENT:     Head: Normocephalic.     Mouth/Throat:     Pharynx: No oropharyngeal exudate.  Eyes:     General: Lids are normal.     Conjunctiva/sclera: Conjunctivae normal.  Cardiovascular:     Rate and Rhythm: Normal rate and regular rhythm.     Heart sounds: Normal heart sounds, S1 normal and S2 normal.  Pulmonary:     Breath sounds: Examination of the right-lower field reveals decreased breath sounds and rhonchi. Examination of the left-lower field reveals decreased breath sounds and rhonchi. Decreased breath sounds and rhonchi present.  No wheezing or rales.  Abdominal:     Palpations: Abdomen is soft.     Tenderness: There is no abdominal tenderness.  Musculoskeletal:     Right lower leg: No swelling.     Left lower leg: No swelling.  Skin:    General: Skin is warm.     Findings: No rash.  Neurological:     Mental Status: She is alert and oriented to person, place, and time.     Data Reviewed: Potassium 3.2, creatinine 0.9, hemoglobin 10.3, white blood count 8.0, platelet  count 186  Family Communication: spoke with husband  Disposition: Status is: Inpatient Remains inpatient appropriate because: Still having quite a bit of diarrhea.  Restart fluids.  Give a dose of Imodium.  Continue Zithromax  and Rocephin IV.  Planned Discharge Destination: Home    Time spent: 28 minutes  Author: Verla Glaze, MD 10/09/2023 1:17 PM  For on call review [email or web address].

## 2023-10-10 DIAGNOSIS — E876 Hypokalemia: Secondary | ICD-10-CM | POA: Diagnosis not present

## 2023-10-10 DIAGNOSIS — A045 Campylobacter enteritis: Secondary | ICD-10-CM | POA: Diagnosis not present

## 2023-10-10 DIAGNOSIS — J189 Pneumonia, unspecified organism: Secondary | ICD-10-CM | POA: Diagnosis not present

## 2023-10-10 DIAGNOSIS — J9601 Acute respiratory failure with hypoxia: Secondary | ICD-10-CM | POA: Diagnosis not present

## 2023-10-10 LAB — BASIC METABOLIC PANEL WITH GFR
Anion gap: 9 (ref 5–15)
BUN: 24 mg/dL — ABNORMAL HIGH (ref 8–23)
CO2: 23 mmol/L (ref 22–32)
Calcium: 8.6 mg/dL — ABNORMAL LOW (ref 8.9–10.3)
Chloride: 108 mmol/L (ref 98–111)
Creatinine, Ser: 0.85 mg/dL (ref 0.44–1.00)
GFR, Estimated: >60 mL/min (ref >60)
Glucose, Bld: 92 mg/dL (ref 70–99)
Potassium: 3.8 mmol/L (ref 3.5–5.1)
Sodium: 140 mmol/L (ref 135–145)

## 2023-10-10 LAB — MAGNESIUM: Magnesium: 2.2 mg/dL (ref 1.7–2.4)

## 2023-10-10 MED ORDER — GERHARDT'S BUTT CREAM: 1.0000 | TOPICAL_CREAM | Freq: Two times a day (BID) | CUTANEOUS | Status: DC

## 2023-10-10 MED ORDER — LOPERAMIDE HCL 2 MG PO CAPS: 4.0000 mg | ORAL_CAPSULE | ORAL | 0 refills | Status: DC | PRN

## 2023-10-10 MED ORDER — AZITHROMYCIN 250 MG PO TABS: ORAL_TABLET | ORAL | 0 refills | Status: DC

## 2023-10-10 MED ORDER — PREDNISONE 20 MG PO TABS: 40.0000 mg | ORAL_TABLET | Freq: Every day | ORAL | 0 refills | Status: AC

## 2023-10-10 MED ORDER — CEFDINIR 300 MG PO CAPS: 300.0000 mg | ORAL_CAPSULE | Freq: Two times a day (BID) | ORAL | 0 refills | Status: AC

## 2023-10-10 NOTE — Discharge Instructions (Signed)
 Must stay hydrated

## 2023-10-10 NOTE — Plan of Care (Signed)
  Problem: Education: Goal: Knowledge of General Education information will improve Description: Including pain rating scale, medication(s)/side effects and non-pharmacologic comfort measures Outcome: Progressing   Problem: Health Behavior/Discharge Planning: Goal: Ability to manage health-related needs will improve Outcome: Progressing   Problem: Clinical Measurements: Goal: Ability to maintain clinical measurements within normal limits will improve Outcome: Progressing Goal: Will remain free from infection Outcome: Progressing Goal: Diagnostic test results will improve Outcome: Progressing Goal: Respiratory complications will improve Outcome: Progressing Goal: Cardiovascular complication will be avoided Outcome: Progressing   Problem: Coping: Goal: Level of anxiety will decrease Outcome: Progressing   Problem: Nutrition: Goal: Adequate nutrition will be maintained Outcome: Progressing   Problem: Elimination: Goal: Will not experience complications related to bowel motility Outcome: Progressing Goal: Will not experience complications related to urinary retention Outcome: Progressing   Problem: Pain Managment: Goal: General experience of comfort will improve and/or be controlled Outcome: Progressing   Problem: Clinical Measurements: Goal: Ability to maintain a body temperature in the normal range will improve Outcome: Progressing   Problem: Respiratory: Goal: Ability to maintain adequate ventilation will improve Outcome: Progressing Goal: Ability to maintain a clear airway will improve Outcome: Progressing

## 2023-10-10 NOTE — Discharge Summary (Signed)
 Physician Discharge Summary   Patient: Lindsey Harmon MRN: [id number] DOB: 06-05-1945  Admit date:     10/07/2023  Discharge date: 10/10/23  Discharge Physician: Verla Glaze   PCP: Dellar Fenton, MD   Recommendations at discharge:   Follow-up PCP 5 days  Discharge Diagnoses: Principal Problem:   Campylobacter diarrhea Active Problems:   Multifocal pneumonia   Acute hypoxic respiratory failure (HCC)   Hypokalemia   Essential hypertension, benign   Hypercholesterolemia   Urinary retention, chronic   Self-catheterizes urinary bladder    Hospital Course: 78 y.o. female with medical history significant for Urinary retention who self catheterizes, HTN, HLD, being admitted with community-acquired pneumonia.  Patient saw her PCP on 5/21 with cough and congestion, body aches and malaise and a fever to 101.9.  Respiratory viral panel was negative at the clinic and she was prescribed Augmentin and azithromycin .  After starting the antibiotics within a day she developed diarrhea and called her doctor who advised her to come into the ED to be evaluated.  She states that she is having several bowel accidents even with coughing and now has to wear a diaper.  She denies nausea, vomiting, abdominal pain chest pain, dysuria. ED course and data review: Afebrile with heart rate in the 70s.  Was reportedly hypoxic and placed on O2 at 2 L Labs notable for WBC 12,000 and potassium 3 Respiratory viral panel negative   CTA chest negative for PE but showing bilateral lower lobe bronchiectasis and patchy airspace opacities both lung bases concerning for aspiration pneumonitis or pneumonia.     Patient started on Rocephin and azithromycin  hospitalist consulted for admission.    5/24.  Stool culture positive for Campylobacter.  Zithromax  will cover.  Potassium low we will replace that IV and orally. 5/25.  Patient had another 3 bowel movements today and 5 documented yesterday.  Will give a dose of  Imodium.  Continue Zithromax .  Assessment and Plan: * Campylobacter diarrhea Zithromax  will cover.  Patient still having quite a bit of diarrhea.  Will restart fluids today.  Give a dose of Imodium.  Multifocal pneumonia Continue Rocephin and Zithromax   Acute hypoxic respiratory failure (HCC) Pulse ox 88% on room air.  Patient off oxygen  Hypokalemia Replace potassium orally and in IV fluids  Urinary retention, chronic Urinary self-catheterization Patient allowed to self catheterize prn if willing otherwise nurse to catheterize Q6  Hypercholesterolemia Continue statin  Essential hypertension, benign Hold bisoprolol  with bradycardia.  Hold hydrochlorothiazide  with hypokalemia.  Continue Avapro.         Consultants: None Procedures performed: None Disposition: Home Diet recommendation:  Cardiac diet DISCHARGE MEDICATION: Allergies as of 10/10/2023       Reactions   Ace Inhibitors Cough   cough        Medication List     STOP taking these medications    bisoprolol  5 MG tablet Commonly known as: ZEBETA    hydrochlorothiazide  12.5 MG capsule Commonly known as: MICROZIDE        TAKE these medications    azithromycin  250 MG tablet Commonly known as: Zithromax  One tablet po daily at 6pm for three days   CALTRATE 600+D PO Take by mouth.   cefdinir 300 MG capsule Commonly known as: OMNICEF Take 1 capsule (300 mg total) by mouth 2 (two) times daily for 2 days. Start taking on: Oct 11, 2023   Fish Oil 1200 MG Caps Take by mouth daily.   Gerhardt's butt cream Crea Apply 1 Application topically 2 (  two) times daily.   loperamide 2 MG capsule Commonly known as: IMODIUM Take 2 capsules (4 mg total) by mouth as needed for diarrhea or loose stools.   olmesartan  40 MG tablet Commonly known as: BENICAR  Take 1 tablet (40 mg total) by mouth daily.   omeprazole  20 MG capsule Commonly known as: PRILOSEC Take 1 capsule (20 mg total) by mouth 2 (two) times  daily.   predniSONE  20 MG tablet Commonly known as: DELTASONE  Take 2 tablets (40 mg total) by mouth daily for 2 days. Start taking on: Oct 11, 2023   rosuvastatin  20 MG tablet Commonly known as: Crestor  Take 1 tablet (20 mg total) by mouth daily.   sertraline  50 MG tablet Commonly known as: ZOLOFT  Take 1.5 tablets (75 mg total) by mouth daily.        Follow-up Information     Dellar Fenton, MD Follow up in 5 day(s).   Specialty: Internal Medicine Contact information: [address] [place] [address] [phone]                Discharge Exam: Cleavon Curls Weights   10/07/23 1426  Weight: 79.4 kg   Physical Exam HENT:     Head: Normocephalic.     Mouth/Throat:     Pharynx: No oropharyngeal exudate.  Eyes:     General: Lids are normal.     Conjunctiva/sclera: Conjunctivae normal.  Cardiovascular:     Rate and Rhythm: Normal rate and regular rhythm.     Heart sounds: Normal heart sounds, S1 normal and S2 normal.  Pulmonary:     Breath sounds: Examination of the right-lower field reveals decreased breath sounds. Examination of the left-lower field reveals decreased breath sounds. Decreased breath sounds present. No wheezing, rhonchi or rales.  Abdominal:     Palpations: Abdomen is soft.     Tenderness: There is no abdominal tenderness.  Musculoskeletal:     Right lower leg: No swelling.     Left lower leg: No swelling.  Skin:    General: Skin is warm.     Findings: No rash.  Neurological:     Mental Status: She is alert and oriented to person, place, and time.      Condition at discharge: stable  The results of significant diagnostics from this hospitalization (including imaging, microbiology, ancillary and laboratory) are listed below for reference.   Imaging Studies: CT Angio Chest PE W and/or Wo Contrast Result Date: 10/07/2023 EXAM: CTA of the Chest with contrast for PE 10/07/2023 06:50:38 PM TECHNIQUE: CTA of the chest was  performed after the administration of 54mL iohexol (OMNIPAQUE) 350 MG/ML injection. Multiplanar reformatted images are provided for review. MIP images are provided for review. Automated exposure control, iterative reconstruction, and/or weight based adjustment of the mA/kV was utilized to reduce the radiation dose to as low as reasonably achievable. COMPARISON: Chest x-ray 10/07/2023 2 view CLINICAL HISTORY: Pulmonary embolism (PE) suspected, high prob. Cough, congestion and yellow diarrhea for the last seven days. FINDINGS: PULMONARY ARTERIES: Pulmonary arteries are adequately opacified for evaluation. No pulmonary embolism. Main pulmonary artery is normal in caliber. MEDIASTINUM: The heart and pericardium demonstrate no acute abnormality. Atherosclerotic changes are present at the distal aortic arch and descending thoracic aorta. No aneurysm or stenosis is present. Coronary artery calcifications are present. A large hiatal hernia is present. LYMPH NODES: No mediastinal, hilar or axillary lymphadenopathy. LUNGS AND PLEURA: Bilateral lower lobe bronchiectasis is noted. Patchy airspace opacities are present at both lung bases. No  evidence of pleural effusion or pneumothorax. UPPER ABDOMEN: Limited images of the upper abdomen are unremarkable. SOFT TISSUES AND BONES: No acute bone or soft tissue abnormality. IMPRESSION: 1. No evidence of pulmonary embolism. 2. Bilateral lower lobe bronchiectasis and patchy airspace opacities at both lung bases. Findings are concerning for aspiration pneumonitis or pneumonia. 3. Large hiatal hernia. Electronically signed by: Audree Leas MD 10/07/2023 08:14 PM EDT RP Workstation: [id number]   DG Chest 2 View Result Date: 10/07/2023 CLINICAL DATA:  Cough congestion EXAM: CHEST - 2 VIEW COMPARISON:  None Available. FINDINGS: The heart size and mediastinal contours are within normal limits. Aortic atherosclerosis. Both lungs are clear. The visualized skeletal structures are  unremarkable. Retrocardiac opacity likely reflects a hiatal hernia. IMPRESSION: No active cardiopulmonary disease. Probable hiatal hernia. Electronically Signed   By: Esmeralda Hedge M.D.   On: 10/07/2023 17:06    Microbiology: Results for orders placed or performed during the hospital encounter of 10/07/23  Resp panel by RT-PCR (RSV, Flu A&B, Covid) Anterior Nasal Swab     Status: None   Collection Time: 10/07/23  6:22 PM   Specimen: Anterior Nasal Swab  Result Value Ref Range Status   SARS Coronavirus 2 by RT PCR NEGATIVE NEGATIVE Final    Comment: (NOTE) SARS-CoV-2 target nucleic acids are NOT DETECTED.  The SARS-CoV-2 RNA is generally detectable in upper respiratory specimens during the acute phase of infection. The lowest concentration of SARS-CoV-2 viral copies this assay can detect is 138 copies/mL. A negative result does not preclude SARS-Cov-2 infection and should not be used as the sole basis for treatment or other patient management decisions. A negative result may occur with  improper specimen collection/handling, submission of specimen other than nasopharyngeal swab, presence of viral mutation(s) within the areas targeted by this assay, and inadequate number of viral copies(<138 copies/mL). A negative result must be combined with clinical observations, patient history, and epidemiological information. The expected result is Negative.  Fact Sheet for Patients:  BloggerCourse.com  Fact Sheet for Healthcare Providers:  SeriousBroker.it  This test is no t yet approved or cleared by the [place]  FDA and  has been authorized for detection and/or diagnosis of SARS-CoV-2 by FDA under an Emergency Use Authorization (EUA). This EUA will remain  in effect (meaning this test can be used) for the duration of the COVID-19 declaration under Section 564(b)(1) of the Act, 21 U.S.C.section 360bbb-3(b)(1), unless the authorization is  terminated  or revoked sooner.       Influenza A by PCR NEGATIVE NEGATIVE Final   Influenza B by PCR NEGATIVE NEGATIVE Final    Comment: (NOTE) The Xpert Xpress SARS-CoV-2/FLU/RSV plus assay is intended as an aid in the diagnosis of influenza from Nasopharyngeal swab specimens and should not be used as a sole basis for treatment. Nasal washings and aspirates are unacceptable for Xpert Xpress SARS-CoV-2/FLU/RSV testing.  Fact Sheet for Patients: BloggerCourse.com  Fact Sheet for Healthcare Providers: SeriousBroker.it  This test is not yet approved or cleared by the [place]  FDA and has been authorized for detection and/or diagnosis of SARS-CoV-2 by FDA under an Emergency Use Authorization (EUA). This EUA will remain in effect (meaning this test can be used) for the duration of the COVID-19 declaration under Section 564(b)(1) of the Act, 21 U.S.C. section 360bbb-3(b)(1), unless the authorization is terminated or revoked.     Resp Syncytial Virus by PCR NEGATIVE NEGATIVE Final    Comment: (NOTE) Fact Sheet for Patients: BloggerCourse.com  Fact Sheet for Healthcare Providers:  SeriousBroker.it  This test is not yet approved or cleared by the [place]  FDA and has been authorized for detection and/or diagnosis of SARS-CoV-2 by FDA under an Emergency Use Authorization (EUA). This EUA will remain in effect (meaning this test can be used) for the duration of the COVID-19 declaration under Section 564(b)(1) of the Act, 21 U.S.C. section 360bbb-3(b)(1), unless the authorization is terminated or revoked.  Performed at [facility], [address] [place], [place] [address]   Culture, blood (Routine X 2) w Reflex to ID Panel     Status: None (Preliminary result)   Collection Time: 10/07/23  9:39 PM   Specimen: BLOOD  Result Value Ref Range Status   Specimen  Description BLOOD BLOOD LEFT HAND  Final   Special Requests   Final    BOTTLES DRAWN AEROBIC AND ANAEROBIC Blood Culture adequate volume   Culture   Final    NO GROWTH 3 DAYS Performed at [facility], [address], [place], [place] [address]    Report Status PENDING  Incomplete  Culture, blood (Routine X 2) w Reflex to ID Panel     Status: None (Preliminary result)   Collection Time: 10/07/23  9:45 PM   Specimen: BLOOD  Result Value Ref Range Status   Specimen Description BLOOD BLOOD RIGHT HAND  Final   Special Requests   Final    BOTTLES DRAWN AEROBIC AND ANAEROBIC Blood Culture adequate volume   Culture   Final    NO GROWTH 3 DAYS Performed at [facility], [address], [place], [place] [address]    Report Status PENDING  Incomplete  Gastrointestinal Panel by PCR , Stool     Status: Abnormal   Collection Time: 10/08/23  8:25 AM   Specimen: STOOL  Result Value Ref Range Status   Campylobacter species DETECTED (A) NOT DETECTED Final    Comment: RESULT CALLED TO, READ BACK BY AND VERIFIED WITH: REBECCA ODONNELL AT 1042 10/08/23.PMF    Plesimonas shigelloides NOT DETECTED NOT DETECTED Final   Salmonella species NOT DETECTED NOT DETECTED Final   Yersinia enterocolitica NOT DETECTED NOT DETECTED Final   Vibrio species NOT DETECTED NOT DETECTED Final   Vibrio cholerae NOT DETECTED NOT DETECTED Final   Enteroaggregative E coli (EAEC) NOT DETECTED NOT DETECTED Final   Enteropathogenic E coli (EPEC) NOT DETECTED NOT DETECTED Final   Enterotoxigenic E coli (ETEC) NOT DETECTED NOT DETECTED Final   Shiga like toxin producing E coli (STEC) NOT DETECTED NOT DETECTED Final   Shigella/Enteroinvasive E coli (EIEC) NOT DETECTED NOT DETECTED Final   Cryptosporidium NOT DETECTED NOT DETECTED Final   Cyclospora cayetanensis NOT DETECTED NOT DETECTED Final   Entamoeba histolytica NOT DETECTED NOT DETECTED Final   Giardia lamblia NOT DETECTED NOT DETECTED Final    Adenovirus F40/41 NOT DETECTED NOT DETECTED Final   Astrovirus NOT DETECTED NOT DETECTED Final   Norovirus GI/GII NOT DETECTED NOT DETECTED Final   Rotavirus A NOT DETECTED NOT DETECTED Final   Sapovirus (I, II, IV, and V) NOT DETECTED NOT DETECTED Final    Comment: Performed at [facility], [address] [place], [place] [address]  C Difficile Quick Screen w PCR reflex     Status: None   Collection Time: 10/08/23  8:25 AM   Specimen: STOOL  Result Value Ref Range Status   C Diff antigen NEGATIVE NEGATIVE Final   C Diff toxin NEGATIVE NEGATIVE Final   C Diff interpretation No C. difficile  detected.  Final    Comment: Performed at [facility], [address] [place], [place] [address]    Labs: CBC: Recent Labs  Lab 10/07/23 1428 10/08/23 0745 10/09/23 0444  WBC 12.1* 6.3 8.0  HGB 12.7 11.1* 10.3*  HCT 37.2 33.0* 30.8*  MCV 86.1 85.3 86.5  PLT 195 181 186   Basic Metabolic Panel: Recent Labs  Lab 10/07/23 1428 10/08/23 0745 10/09/23 0444 10/10/23 0412  NA 136 138 140 140  K 3.0* 3.0* 3.2* 3.8  CL 98 101 104 108  CO2 23 25 24 23   GLUCOSE 105* 131* 121* 92  BUN 27* 25* 24* 24*  CREATININE 0.98 0.84 0.90 0.85  CALCIUM  9.3 9.0 8.8* 8.6*  MG  --  2.4  --  2.2   Liver Function Tests: Recent Labs  Lab 10/07/23 1428  AST 27  ALT 20  ALKPHOS 68  BILITOT 1.3*  PROT 7.6  ALBUMIN 4.0   CBG: Recent Labs  Lab 10/08/23 0826  GLUCAP 123*    Discharge time spent: greater than 30 minutes.  Signed: Verla Glaze, MD [facility] 10/10/2023

## 2023-10-11 ENCOUNTER — Telehealth: Payer: Self-pay

## 2023-10-11 ENCOUNTER — Encounter: Payer: Self-pay | Admitting: Internal Medicine

## 2023-10-11 NOTE — Telephone Encounter (Signed)
 Reviewed. I can see her 10/19/23 at 12:00 for hospital follow up.

## 2023-10-11 NOTE — Transitions of Care (Post Inpatient/ED Visit) (Signed)
   10/11/2023  Name: Lindsey Harmon MRN: [id number] DOB: April 16, 1946  Today's TOC FU Call Status: Today's TOC FU Call Status:: Successful TOC FU Call Completed TOC FU Call Complete Date: 10/11/23 Patient's Name and Date of Birth confirmed.  Transition Care Management Follow-up Telephone Call Date of Discharge: 10/10/23 Discharge Facility: [facility]) Type of Discharge: Inpatient Admission Primary Inpatient Discharge Diagnosis:: campylobacterenteritis How have you been since you were released from the hospital?: Better Any questions or concerns?: No  Items Reviewed: Did you receive and understand the discharge instructions provided?: Yes Medications obtained,verified, and reconciled?: Yes (Medications Reviewed) Any new allergies since your discharge?: No Dietary orders reviewed?: Yes Do you have support at home?: Yes People in Home [RPT]: spouse  Medications Reviewed Today: Medications Reviewed Today     Reviewed by Darrall Ellison, LPN (Licensed Practical Nurse) on 10/11/23 at 1213  Med List Status: <None>   Medication Order Taking? Sig Documenting Provider Last Dose Status Informant  azithromycin  (ZITHROMAX ) 250 MG tablet [id number]  One tablet po daily at 6pm for three days Verla Glaze, MD  Active   Calcium  Carbonate-Vitamin D (CALTRATE 600+D PO) [id number] No Take by mouth. [provider] Taking Active   cefdinir (OMNICEF) 300 MG capsule [id number]  Take 1 capsule (300 mg total) by mouth 2 (two) times daily for 2 days. Verla Glaze, MD  Active   loperamide (IMODIUM) 2 MG capsule [id number]  Take 2 capsules (4 mg total) by mouth as needed for diarrhea or loose stools. Verla Glaze, MD  Active   Nystatin  (GERHARDT'S BUTT CREAM) CREA [id number]  Apply 1 Application topically 2 (two) times daily. Verla Glaze, MD  Active   olmesartan  (BENICAR ) 40 MG tablet [id number]  Take 1 tablet (40 mg total) by mouth daily. Dellar Fenton, MD   Active   Omega-3 Fatty Acids (FISH OIL) 1200 MG CAPS [id number] No Take by mouth daily. [provider] Taking Active   omeprazole  (PRILOSEC) 20 MG capsule [id number]  Take 1 capsule (20 mg total) by mouth 2 (two) times daily. Dellar Fenton, MD  Active   predniSONE  (DELTASONE ) 20 MG tablet [id number]  Take 2 tablets (40 mg total) by mouth daily for 2 days. Verla Glaze, MD  Active   rosuvastatin  (CRESTOR ) 20 MG tablet [id number]  Take 1 tablet (20 mg total) by mouth daily. Dellar Fenton, MD  Active   sertraline  (ZOLOFT ) 50 MG tablet [id number]  Take 1.5 tablets (75 mg total) by mouth daily. Dellar Fenton, MD  Active             Home Care and Equipment/Supplies: Were Home Health Services Ordered?: NA Any new equipment or medical supplies ordered?: NA  Functional Questionnaire: Do you need assistance with bathing/showering or dressing?: No Do you need assistance with meal preparation?: No Do you need assistance with eating?: No Do you have difficulty maintaining continence: No Do you need assistance with getting out of bed/getting out of a chair/moving?: No Do you have difficulty managing or taking your medications?: No  Follow up appointments reviewed: PCP Follow-up appointment confirmed?: Yes Date of PCP follow-up appointment?: 10/19/23 Follow-up Provider: [facility] Follow-up appointment confirmed?: NA Do you need transportation to your follow-up appointment?: No Do you understand care options if your condition(s) worsen?: Yes-patient verbalized understanding    SIGNATURE Darrall Ellison, LPN [facility] Nurse Health Advisor Direct Dial [phone]

## 2023-10-12 LAB — CULTURE, BLOOD (ROUTINE X 2)
Culture: NO GROWTH
Culture: NO GROWTH
Special Requests: ADEQUATE
Special Requests: ADEQUATE

## 2023-10-12 NOTE — Telephone Encounter (Signed)
 Patient has been scheduled 5/30. Appt on 6/4 already used.

## 2023-10-12 NOTE — Telephone Encounter (Signed)
 See phone note

## 2023-10-14 ENCOUNTER — Ambulatory Visit: Admitting: Internal Medicine

## 2023-10-14 ENCOUNTER — Telehealth: Payer: Self-pay

## 2023-10-14 ENCOUNTER — Encounter: Payer: Self-pay | Admitting: Internal Medicine

## 2023-10-14 VITALS — BP 136/84 | HR 92 | Ht 64.0 in | Wt 170.8 lb

## 2023-10-14 DIAGNOSIS — I10 Essential (primary) hypertension: Secondary | ICD-10-CM

## 2023-10-14 DIAGNOSIS — J189 Pneumonia, unspecified organism: Secondary | ICD-10-CM

## 2023-10-14 DIAGNOSIS — F429 Obsessive-compulsive disorder, unspecified: Secondary | ICD-10-CM

## 2023-10-14 DIAGNOSIS — A045 Campylobacter enteritis: Secondary | ICD-10-CM

## 2023-10-14 DIAGNOSIS — N1831 Chronic kidney disease, stage 3a: Secondary | ICD-10-CM | POA: Diagnosis not present

## 2023-10-14 DIAGNOSIS — D649 Anemia, unspecified: Secondary | ICD-10-CM | POA: Diagnosis not present

## 2023-10-14 DIAGNOSIS — Z789 Other specified health status: Secondary | ICD-10-CM

## 2023-10-14 DIAGNOSIS — E7800 Pure hypercholesterolemia, unspecified: Secondary | ICD-10-CM

## 2023-10-14 DIAGNOSIS — R739 Hyperglycemia, unspecified: Secondary | ICD-10-CM | POA: Diagnosis not present

## 2023-10-14 LAB — CBC WITH DIFFERENTIAL/PLATELET
Basophils Absolute: 0.1 10*3/uL (ref 0.0–0.1)
Basophils Relative: 0.8 % (ref 0.0–3.0)
Eosinophils Absolute: 0.1 10*3/uL (ref 0.0–0.7)
Eosinophils Relative: 1.7 % (ref 0.0–5.0)
HCT: 39.7 % (ref 36.0–46.0)
Hemoglobin: 13.3 g/dL (ref 12.0–15.0)
Lymphocytes Relative: 26.9 % (ref 12.0–46.0)
Lymphs Abs: 1.9 10*3/uL (ref 0.7–4.0)
MCHC: 33.5 g/dL (ref 30.0–36.0)
MCV: 85.4 fl (ref 78.0–100.0)
Monocytes Absolute: 0.4 10*3/uL (ref 0.1–1.0)
Monocytes Relative: 5.7 % (ref 3.0–12.0)
Neutro Abs: 4.6 10*3/uL (ref 1.4–7.7)
Neutrophils Relative %: 64.9 % (ref 43.0–77.0)
Platelets: 255 10*3/uL (ref 150.0–400.0)
RBC: 4.65 Mil/uL (ref 3.87–5.11)
RDW: 15.2 % (ref 11.5–15.5)
WBC: 7.1 10*3/uL (ref 4.0–10.5)

## 2023-10-14 LAB — BASIC METABOLIC PANEL WITH GFR
BUN: 16 mg/dL (ref 6–23)
CO2: 26 meq/L (ref 19–32)
Calcium: 9.3 mg/dL (ref 8.4–10.5)
Chloride: 102 meq/L (ref 96–112)
Creatinine, Ser: 0.95 mg/dL (ref 0.40–1.20)
GFR: 56.63 mL/min — ABNORMAL LOW (ref >60.00)
Glucose, Bld: 82 mg/dL (ref 70–99)
Potassium: 3.8 meq/L (ref 3.5–5.1)
Sodium: 140 meq/L (ref 135–145)

## 2023-10-14 LAB — HEPATIC FUNCTION PANEL
ALT: 52 U/L — ABNORMAL HIGH (ref 0–35)
AST: 41 U/L — ABNORMAL HIGH (ref 0–37)
Albumin: 4.2 g/dL (ref 3.5–5.2)
Alkaline Phosphatase: 63 U/L (ref 39–117)
Bilirubin, Direct: 0.2 mg/dL (ref 0.0–0.3)
Total Bilirubin: 0.9 mg/dL (ref 0.2–1.2)
Total Protein: 6.8 g/dL (ref 6.0–8.3)

## 2023-10-14 LAB — IBC + FERRITIN
Ferritin: 79.7 ng/mL (ref 10.0–291.0)
Iron: 82 ug/dL (ref 42–145)
Saturation Ratios: 26.4 % (ref 20.0–50.0)
TIBC: 310.8 ug/dL (ref 250.0–450.0)
Transferrin: 222 mg/dL (ref 212.0–360.0)

## 2023-10-14 LAB — VITAMIN B12: Vitamin B-12: 465 pg/mL (ref 211–911)

## 2023-10-14 MED ORDER — GERHARDT'S BUTT CREAM: 1.0000 | TOPICAL_CREAM | Freq: Two times a day (BID) | CUTANEOUS | 0 refills | Status: DC

## 2023-10-14 NOTE — Progress Notes (Signed)
 Subjective:    Patient ID: Lindsey Harmon, female    DOB: 1946/03/19, 78 y.o.   MRN: [id number]  Patient here for  Chief Complaint  Patient presents with   Hospitalization Follow-up    HPI Here for hospital follow up - hospitalized 10/07/23 - 10/10/23 - admitted with CAP. CTA chest negative for PE but showing bilateral lower lobe bronchiectasis and patchy airspace opacities both lung bases concerning for aspiration pneumonitis or pneumonia. Treated with rocephin  and azithromycin . Stool culture positive for campylobacter - treated with azithromycin . Since her discharge, she reports still feeling weak. Her cough and congestion is better. Diarrhea has improved. Still with some loose stool, but better. Last bowel movement - 1:30 am this am. Starting to become more formed. Reports decreased appetite. Eating some fruit and yogurt. Discussed advancing diet slowly. No vomiting. No abdominal pain. Reports irritation - peri rectal irritation. Using "butt cream". Husband reports - has improved. Breathing stable. Cough and congestion better. Denies sob. Blood pressure medication bisoprolol  and hydrochlorothiazide  - one hold.    Past Medical History:  Diagnosis Date   Abnormal liver function    Cancer (HCC)    Skin cancer   Cystocele, unspecified (CODE)    Fibrocystic breast disease    GERD (gastroesophageal reflux disease)    Hypertension    Intrinsic sphincter deficiency    Pelvic relaxation    Pure hypercholesterolemia    Rectocele    SUI (stress urinary incontinence, female)    Urinary retention    Urinary retention    Past Surgical History:  Procedure Laterality Date   ABDOMINAL HYSTERECTOMY     APPENDECTOMY     Bladder Tack     burch urethropexy  10/24/2000   laparoscopic colpopexy/culdoplasty   COLONOSCOPY WITH PROPOFOL  N/A 08/10/2016   Procedure: COLONOSCOPY WITH PROPOFOL ;  Surgeon: Deveron Fly, MD;  Location: [facility] ENDOSCOPY;  Service: Endoscopy;  Laterality: N/A;    COLONOSCOPY WITH PROPOFOL  N/A 10/20/2016   Procedure: COLONOSCOPY WITH PROPOFOL ;  Surgeon: Marshall Skeeter, MD;  Location: [facility] ENDOSCOPY;  Service: Endoscopy;  Laterality: N/A;   COLONOSCOPY WITH PROPOFOL  N/A 06/01/2017   Procedure: COLONOSCOPY WITH PROPOFOL ;  Surgeon: Marshall Skeeter, MD;  Location: [facility] ENDOSCOPY;  Service: Endoscopy;  Laterality: N/A;   COLONOSCOPY WITH PROPOFOL  N/A 12/23/2021   Procedure: COLONOSCOPY WITH PROPOFOL ;  Surgeon: Marshall Skeeter, MD;  Location: [facility] ENDOSCOPY;  Service: Endoscopy;  Laterality: N/A;   COLPORRHAPHY     forv repair cystocele anterior   EYE SURGERY  08/19/23 - 09/02/23   cataract surgery   TRANSVAGINAL TAPE (TVT) REMOVAL     Family History  Problem Relation Age of Onset   Pneumonia Mother        died of presumed aspiration pneumonia   Hypertension Mother    Hearing loss Mother    Cancer Father        prostate   Heart disease Paternal Grandfather        myocardial infarction   Alzheimer's disease Brother    Breast cancer Neg Hx    Social History   Socioeconomic History   Marital status: Married    Spouse name: Not on file   Number of children: Not on file   Years of education: Not on file   Highest education level: 12th grade  Occupational History   Not on file  Tobacco Use   Smoking status: Never   Smokeless tobacco: Never  Vaping Use   Vaping status: Never Used  Substance  and Sexual Activity   Alcohol use: Never   Drug use: Never   Sexual activity: Not Currently    Birth control/protection: Abstinence  Other Topics Concern   Not on file  Social History Narrative   married   Social Drivers of Corporate investment banker Strain: Low Risk  (08/26/2023)   Overall Financial Resource Strain (CARDIA)    Difficulty of Paying Living Expenses: Not hard at all  Food Insecurity: No Food Insecurity (10/07/2023)   Hunger Vital Sign    Worried About Running Out of Food in the Last Year: Never true    Ran Out of Food in the  Last Year: Never true  Transportation Needs: No Transportation Needs (10/07/2023)   PRAPARE - Administrator, Civil Service (Medical): No    Lack of Transportation (Non-Medical): No  Physical Activity: Unknown (08/26/2023)   Exercise Vital Sign    Days of Exercise per Week: 2 days    Minutes of Exercise per Session: Patient declined  Stress: No Stress Concern Present (08/26/2023)   [employer] of Occupational Health - Occupational Stress Questionnaire    Feeling of Stress : Only a little  Social Connections: Socially Integrated (10/07/2023)   Social Connection and Isolation Panel [NHANES]    Frequency of Communication with Friends and Family: More than three times a week    Frequency of Social Gatherings with Friends and Family: More than three times a week    Attends Religious Services: More than 4 times per year    Active Member of [employer] or Organizations: Yes    Attends Banker Meetings: 1 to 4 times per year    Marital Status: Married     Review of Systems  Constitutional:  Positive for appetite change and fatigue. Negative for fever.  HENT:  Negative for congestion and sinus pressure.   Respiratory:  Negative for chest tightness and shortness of breath.        Cough improved. No increased sob.   Cardiovascular:  Negative for chest pain, palpitations and leg swelling.  Gastrointestinal:  Negative for abdominal pain and nausea.       Decreased diarrhea. Stool starting to become more formed.   Genitourinary:  Negative for difficulty urinating and dysuria.  Musculoskeletal:  Negative for joint swelling and myalgias.  Skin:  Negative for color change and rash.  Neurological:  Negative for dizziness and headaches.  Psychiatric/Behavioral:  Negative for agitation.        Increased stress and anxiety with current illness and recent hospitalization.        Objective:     BP 136/84   Pulse 92   Ht 5\' 4"  (1.626 m)   Wt 170 lb 12.8 oz (77.5 kg)   SpO2  96%   BMI 29.32 kg/m  Wt Readings from Last 3 Encounters:  10/14/23 170 lb 12.8 oz (77.5 kg)  10/07/23 175 lb (79.4 kg)  08/30/23 173 lb (78.5 kg)    Physical Exam Vitals reviewed.  Constitutional:      General: She is not in acute distress.    Appearance: Normal appearance.  HENT:     Head: Normocephalic and atraumatic.     Right Ear: External ear normal.     Left Ear: External ear normal.     Mouth/Throat:     Pharynx: No oropharyngeal exudate or posterior oropharyngeal erythema.  Eyes:     General: No scleral icterus.       Right eye: No discharge.  Left eye: No discharge.     Conjunctiva/sclera: Conjunctivae normal.  Neck:     Thyroid : No thyromegaly.  Cardiovascular:     Rate and Rhythm: Normal rate and regular rhythm.  Pulmonary:     Effort: No respiratory distress.     Breath sounds: No wheezing.     Comments: Increased air movement.  Abdominal:     General: Bowel sounds are normal.     Palpations: Abdomen is soft.     Tenderness: There is no abdominal tenderness.  Musculoskeletal:        General: No swelling or tenderness.     Cervical back: Neck supple. No tenderness.  Lymphadenopathy:     Cervical: No cervical adenopathy.  Skin:    Findings: No erythema or rash.  Neurological:     Mental Status: She is alert.  Psychiatric:        Mood and Affect: Mood normal.        Behavior: Behavior normal.         Outpatient Encounter Medications as of 10/14/2023  Medication Sig   Calcium  Carbonate-Vitamin D (CALTRATE 600+D PO) Take by mouth.   Nystatin  (GERHARDT'S BUTT CREAM) CREA Apply 1 Application topically 2 (two) times daily.   olmesartan  (BENICAR ) 40 MG tablet Take 1 tablet (40 mg total) by mouth daily.   Omega-3 Fatty Acids (FISH OIL) 1200 MG CAPS Take by mouth daily.   omeprazole  (PRILOSEC) 20 MG capsule Take 1 capsule (20 mg total) by mouth 2 (two) times daily.   rosuvastatin  (CRESTOR ) 20 MG tablet Take 1 tablet (20 mg total) by mouth daily.    sertraline  (ZOLOFT ) 50 MG tablet Take 1.5 tablets (75 mg total) by mouth daily.   [DISCONTINUED] loperamide  (IMODIUM ) 2 MG capsule Take 2 capsules (4 mg total) by mouth as needed for diarrhea or loose stools.   [DISCONTINUED] Nystatin  (GERHARDT'S BUTT CREAM) CREA Apply 1 Application topically 2 (two) times daily.   bisoprolol  (ZEBETA ) 5 MG tablet Take 5 mg by mouth daily. (Patient not taking: Reported on 10/14/2023)   hydrochlorothiazide  (MICROZIDE ) 12.5 MG capsule Take 12.5 mg by mouth daily. (Patient not taking: Reported on 10/14/2023)   [DISCONTINUED] azithromycin  (ZITHROMAX ) 250 MG tablet One tablet po daily at 6pm for three days (Patient not taking: Reported on 10/14/2023)   No facility-administered encounter medications on file as of 10/14/2023.     Lab Results  Component Value Date   WBC 7.1 10/14/2023   HGB 13.3 10/14/2023   HCT 39.7 10/14/2023   PLT 255.0 10/14/2023   GLUCOSE 82 10/14/2023   CHOL 118 08/09/2023   TRIG 153.0 (H) 08/09/2023   HDL 43.00 08/09/2023   LDLDIRECT 83.0 05/30/2018   LDLCALC 45 08/09/2023   ALT 52 (H) 10/14/2023   AST 41 (H) 10/14/2023   NA 140 10/14/2023   K 3.8 10/14/2023   CL 102 10/14/2023   CREATININE 0.95 10/14/2023   BUN 16 10/14/2023   CO2 26 10/14/2023   TSH 4.03 08/09/2023   HGBA1C 6.1 08/09/2023    CT Angio Chest PE W and/or Wo Contrast Result Date: 10/07/2023 EXAM: CTA of the Chest with contrast for PE 10/07/2023 06:50:38 PM TECHNIQUE: CTA of the chest was performed after the administration of 54mL iohexol  (OMNIPAQUE ) 350 MG/ML injection. Multiplanar reformatted images are provided for review. MIP images are provided for review. Automated exposure control, iterative reconstruction, and/or weight based adjustment of the mA/kV was utilized to reduce the radiation dose to as low as reasonably achievable. COMPARISON: Chest  x-ray 10/07/2023 2 view CLINICAL HISTORY: Pulmonary embolism (PE) suspected, high prob. Cough, congestion and yellow  diarrhea for the last seven days. FINDINGS: PULMONARY ARTERIES: Pulmonary arteries are adequately opacified for evaluation. No pulmonary embolism. Main pulmonary artery is normal in caliber. MEDIASTINUM: The heart and pericardium demonstrate no acute abnormality. Atherosclerotic changes are present at the distal aortic arch and descending thoracic aorta. No aneurysm or stenosis is present. Coronary artery calcifications are present. A large hiatal hernia is present. LYMPH NODES: No mediastinal, hilar or axillary lymphadenopathy. LUNGS AND PLEURA: Bilateral lower lobe bronchiectasis is noted. Patchy airspace opacities are present at both lung bases. No evidence of pleural effusion or pneumothorax. UPPER ABDOMEN: Limited images of the upper abdomen are unremarkable. SOFT TISSUES AND BONES: No acute bone or soft tissue abnormality. IMPRESSION: 1. No evidence of pulmonary embolism. 2. Bilateral lower lobe bronchiectasis and patchy airspace opacities at both lung bases. Findings are concerning for aspiration pneumonitis or pneumonia. 3. Large hiatal hernia. Electronically signed by: Audree Leas MD 10/07/2023 08:14 PM EDT RP Workstation: [id number]   DG Chest 2 View Result Date: 10/07/2023 CLINICAL DATA:  Cough congestion EXAM: CHEST - 2 VIEW COMPARISON:  None Available. FINDINGS: The heart size and mediastinal contours are within normal limits. Aortic atherosclerosis. Both lungs are clear. The visualized skeletal structures are unremarkable. Retrocardiac opacity likely reflects a hiatal hernia. IMPRESSION: No active cardiopulmonary disease. Probable hiatal hernia. Electronically Signed   By: Esmeralda Hedge M.D.   On: 10/07/2023 17:06       Assessment & Plan:  Essential hypertension, benign Assessment & Plan: Off bisoprolol  and hydrochlorothiazide  currently. Given blood pressure ok and given persistent decreased po intake, will hold on restarting. Continue avapro . Follow pressures.  Follow metabolic  panel. Check today. Get her back in soon to reassess blood pressure.  May need to add back medication.   Orders: -     Basic metabolic panel with GFR  Hyperbilirubinemia Assessment & Plan: Recheck liver panel to confirm wnl.   Orders: -     Hepatic function panel  Anemia, unspecified type Assessment & Plan: Hgb noted to be decreased in hospital. Recheck cbc today, along with iron studies and B12.   Orders: -     CBC with Differential/Platelet -     Vitamin B12 -     IBC + Ferritin  Obsessive-compulsive disorder, unspecified type Assessment & Plan: Continues on zoloft . Increased stress related to recent hospitalization and illness. Follow. No changes in medication.    Multifocal pneumonia Assessment & Plan: Recently hospitalized as outlined. Treated with abx. Breathing improved. Cough improved. Discussed will need f/u cxr/scan.    Campylobacter diarrhea Assessment & Plan: Treated with azithromycin . Bowels have improved. Stool becoming more formed. Less frequent stools. Start probiotic. Advance diet slowly.    Stage 3a chronic kidney disease (HCC) Assessment & Plan: Continue to avoid antiinflammatories.  Stay hydrated.  Follow metabolic panel. Cotinue benicar . Check metabolic panel today.    Hypercholesterolemia Assessment & Plan: On crestor  20mg  q day.  Low cholesterol diet and exercise.  Follow lipid panel.    Hyperglycemia Assessment & Plan: Low carb diet and exercise. Follow met b and A1c.    Self-catheterizes urinary bladder Assessment & Plan: Will restart suppressive abx.    Other orders -     Gerhardt's butt cream; Apply 1 Application topically 2 (two) times daily.  Dispense: 60 each; Refill: 0     Dellar Fenton, MD

## 2023-10-14 NOTE — Telephone Encounter (Signed)
 Patient had an appointment with Dr. Dellar Fenton today and Dr. Geralyn Knee would like to see her again in two weeks.  The only slot available is a hospital follow-up on 10/28/2023.  I spoke with Chadwick Colonel, CMA, and she will check with Dr. Geralyn Knee to see if it's ok to use this slot.  I have not deleted the appointment with Bluford Burkitt, NP, on 10/19/2023 yet as noted in check-out note from today.  I'm waiting to hear what Dr. Geralyn Knee would like to do.

## 2023-10-14 NOTE — Patient Instructions (Signed)
 Start a probiotic daily (examples of probiotics are culturelle, florastor or aling).   Restart your antibiotic for your bladder.

## 2023-10-14 NOTE — Telephone Encounter (Signed)
 scheduled

## 2023-10-14 NOTE — Telephone Encounter (Signed)
 Ok to see in the 10/28/23 appt slot.

## 2023-10-14 NOTE — Telephone Encounter (Signed)
 Pharmacy Patient Advocate Encounter   Received notification from CoverMyMeds that prior authorization for Nystatin  100000 UNIT/GM powder is required/requested.   Insurance verification completed.   The patient is insured through [place] .   Per test claim: PA required; PA submitted to above mentioned insurance via CoverMyMeds Key/confirmation #/EOC [id number] Status is pending

## 2023-10-15 ENCOUNTER — Encounter: Payer: Self-pay | Admitting: Internal Medicine

## 2023-10-15 ENCOUNTER — Ambulatory Visit: Payer: Self-pay | Admitting: Internal Medicine

## 2023-10-15 NOTE — Assessment & Plan Note (Signed)
 Recently hospitalized as outlined. Treated with abx. Breathing improved. Cough improved. Discussed will need f/u cxr/scan.

## 2023-10-15 NOTE — Assessment & Plan Note (Signed)
 Continue to avoid antiinflammatories.  Stay hydrated.  Follow metabolic panel. Cotinue benicar . Check metabolic panel today.

## 2023-10-15 NOTE — Assessment & Plan Note (Signed)
Recheck liver panel to confirm wnl.  

## 2023-10-15 NOTE — Assessment & Plan Note (Signed)
 Continues on zoloft . Increased stress related to recent hospitalization and illness. Follow. No changes in medication.

## 2023-10-15 NOTE — Assessment & Plan Note (Signed)
 Hgb noted to be decreased in hospital. Recheck cbc today, along with iron studies and B12.

## 2023-10-15 NOTE — Assessment & Plan Note (Signed)
 Treated with azithromycin . Bowels have improved. Stool becoming more formed. Less frequent stools. Start probiotic. Advance diet slowly.

## 2023-10-15 NOTE — Assessment & Plan Note (Signed)
 Will restart suppressive abx.

## 2023-10-15 NOTE — Assessment & Plan Note (Signed)
 Off bisoprolol  and hydrochlorothiazide  currently. Given blood pressure ok and given persistent decreased po intake, will hold on restarting. Continue avapro . Follow pressures.  Follow metabolic panel. Check today. Get her back in soon to reassess blood pressure.  May need to add back medication.

## 2023-10-15 NOTE — Assessment & Plan Note (Signed)
 On crestor  20mg  q day.  Low cholesterol diet and exercise.  Follow lipid panel.

## 2023-10-15 NOTE — Assessment & Plan Note (Signed)
 Low-carb diet and exercise.  Follow met b and A1c.

## 2023-10-19 ENCOUNTER — Encounter: Payer: Self-pay | Admitting: Internal Medicine

## 2023-10-19 ENCOUNTER — Other Ambulatory Visit (HOSPITAL_COMMUNITY): Payer: Self-pay

## 2023-10-19 ENCOUNTER — Inpatient Hospital Stay: Admitting: Nurse Practitioner

## 2023-10-19 NOTE — Telephone Encounter (Signed)
 Pharmacy Patient Advocate Encounter  Received notification from [employer] that Prior Authorization for Nystatin  100000 UNIT/GM powder  has been APPROVED from 05/18/23 to 05/16/24   PA #/Case ID/Reference #: [id number]

## 2023-10-26 LAB — MISCELLANEOUS TEST

## 2023-10-28 ENCOUNTER — Ambulatory Visit: Admitting: Internal Medicine

## 2023-10-28 ENCOUNTER — Encounter: Payer: Self-pay | Admitting: Internal Medicine

## 2023-10-28 VITALS — BP 116/68 | HR 80 | Temp 98.0°F | Resp 16 | Ht 64.0 in | Wt 167.2 lb

## 2023-10-28 DIAGNOSIS — D649 Anemia, unspecified: Secondary | ICD-10-CM | POA: Diagnosis not present

## 2023-10-28 DIAGNOSIS — Z860100 Personal history of colon polyps, unspecified: Secondary | ICD-10-CM

## 2023-10-28 DIAGNOSIS — F429 Obsessive-compulsive disorder, unspecified: Secondary | ICD-10-CM

## 2023-10-28 DIAGNOSIS — E7800 Pure hypercholesterolemia, unspecified: Secondary | ICD-10-CM | POA: Diagnosis not present

## 2023-10-28 DIAGNOSIS — R739 Hyperglycemia, unspecified: Secondary | ICD-10-CM

## 2023-10-28 DIAGNOSIS — R945 Abnormal results of liver function studies: Secondary | ICD-10-CM

## 2023-10-28 DIAGNOSIS — I10 Essential (primary) hypertension: Secondary | ICD-10-CM | POA: Diagnosis not present

## 2023-10-28 LAB — HEPATIC FUNCTION PANEL
ALT: 19 U/L (ref 0–35)
AST: 25 U/L (ref 0–37)
Albumin: 4.4 g/dL (ref 3.5–5.2)
Alkaline Phosphatase: 62 U/L (ref 39–117)
Bilirubin, Direct: 0.1 mg/dL (ref 0.0–0.3)
Total Bilirubin: 0.9 mg/dL (ref 0.2–1.2)
Total Protein: 6.8 g/dL (ref 6.0–8.3)

## 2023-10-28 LAB — BASIC METABOLIC PANEL WITH GFR
BUN: 16 mg/dL (ref 6–23)
CO2: 28 meq/L (ref 19–32)
Calcium: 9.8 mg/dL (ref 8.4–10.5)
Chloride: 104 meq/L (ref 96–112)
Creatinine, Ser: 0.83 mg/dL (ref 0.40–1.20)
GFR: 66.57 mL/min (ref >60.00)
Glucose, Bld: 91 mg/dL (ref 70–99)
Potassium: 4 meq/L (ref 3.5–5.1)
Sodium: 140 meq/L (ref 135–145)

## 2023-10-28 MED ORDER — SERTRALINE HCL 50 MG PO TABS: 75.0000 mg | ORAL_TABLET | Freq: Every day | ORAL | 1 refills | Status: DC

## 2023-10-28 NOTE — Progress Notes (Unsigned)
 Subjective:    Patient ID: Lindsey Harmon, female    DOB: 1945/06/19, 78 y.o.   MRN: [id number]  Patient here for  Chief Complaint  Patient presents with   Medical Management of Chronic Issues    HPI Here for a scheduled follow up - hospitalized 10/07/23 - 10/10/23 - admitted with CAP. CTA chest negative for PE but showing bilateral lower lobe bronchiectasis and patchy airspace opacities both lung bases concerning for aspiration pneumonitis or pneumonia. Treated with rocephin  and azithromycin . Stool culture positive for campylobacter - treated with azithromycin . Was taken off bisoprolol  and hydrochlorothiazide . Remained off since blood pressure doing well. Since her last visit, she reports feeling better. Energy is better. Appetite is improving. No diarrhea. Bowels are basically back to normal. No sob. No increased cough. Overall feeling better.    Past Medical History:  Diagnosis Date   Abnormal liver function    Cancer (HCC)    Skin cancer   Cystocele, unspecified (CODE)    Fibrocystic breast disease    GERD (gastroesophageal reflux disease)    Hypertension    Intrinsic sphincter deficiency    Pelvic relaxation    Pure hypercholesterolemia    Rectocele    SUI (stress urinary incontinence, female)    Urinary retention    Urinary retention    Past Surgical History:  Procedure Laterality Date   ABDOMINAL HYSTERECTOMY     APPENDECTOMY     Bladder Tack     burch urethropexy  10/24/2000   laparoscopic colpopexy/culdoplasty   COLONOSCOPY WITH PROPOFOL  N/A 08/10/2016   Procedure: COLONOSCOPY WITH PROPOFOL ;  Surgeon: Deveron Fly, MD;  Location: [facility] ENDOSCOPY;  Service: Endoscopy;  Laterality: N/A;   COLONOSCOPY WITH PROPOFOL  N/A 10/20/2016   Procedure: COLONOSCOPY WITH PROPOFOL ;  Surgeon: Marshall Skeeter, MD;  Location: [facility] ENDOSCOPY;  Service: Endoscopy;  Laterality: N/A;   COLONOSCOPY WITH PROPOFOL  N/A 06/01/2017   Procedure: COLONOSCOPY WITH PROPOFOL ;  Surgeon:  Marshall Skeeter, MD;  Location: [facility] ENDOSCOPY;  Service: Endoscopy;  Laterality: N/A;   COLONOSCOPY WITH PROPOFOL  N/A 12/23/2021   Procedure: COLONOSCOPY WITH PROPOFOL ;  Surgeon: Marshall Skeeter, MD;  Location: [facility] ENDOSCOPY;  Service: Endoscopy;  Laterality: N/A;   COLPORRHAPHY     forv repair cystocele anterior   EYE SURGERY  08/19/23 - 09/02/23   cataract surgery   TRANSVAGINAL TAPE (TVT) REMOVAL     Family History  Problem Relation Age of Onset   Pneumonia Mother        died of presumed aspiration pneumonia   Hypertension Mother    Hearing loss Mother    Cancer Father        prostate   Heart disease Paternal Grandfather        myocardial infarction   Alzheimer's disease Brother    Breast cancer Neg Hx    Social History   Socioeconomic History   Marital status: Married    Spouse name: Not on file   Number of children: Not on file   Years of education: Not on file   Highest education level: 12th grade  Occupational History   Not on file  Tobacco Use   Smoking status: Never   Smokeless tobacco: Never  Vaping Use   Vaping status: Never Used  Substance and Sexual Activity   Alcohol use: Never   Drug use: Never   Sexual activity: Not Currently    Birth control/protection: Abstinence  Other Topics Concern   Not on file  Social History Narrative  married   Social Drivers of Corporate investment banker Strain: Low Risk  (08/26/2023)   Overall Financial Resource Strain (CARDIA)    Difficulty of Paying Living Expenses: Not hard at all  Food Insecurity: No Food Insecurity (10/07/2023)   Hunger Vital Sign    Worried About Running Out of Food in the Last Year: Never true    Ran Out of Food in the Last Year: Never true  Transportation Needs: No Transportation Needs (10/07/2023)   PRAPARE - Administrator, Civil Service (Medical): No    Lack of Transportation (Non-Medical): No  Physical Activity: Unknown (08/26/2023)   Exercise Vital Sign    Days of  Exercise per Week: 2 days    Minutes of Exercise per Session: Patient declined  Stress: No Stress Concern Present (08/26/2023)   [employer] of Occupational Health - Occupational Stress Questionnaire    Feeling of Stress : Only a little  Social Connections: Socially Integrated (10/07/2023)   Social Connection and Isolation Panel    Frequency of Communication with Friends and Family: More than three times a week    Frequency of Social Gatherings with Friends and Family: More than three times a week    Attends Religious Services: More than 4 times per year    Active Member of [employer] or Organizations: Yes    Attends Banker Meetings: 1 to 4 times per year    Marital Status: Married     Review of Systems  Constitutional:  Negative for appetite change and unexpected weight change.  HENT:  Negative for congestion and sinus pressure.   Respiratory:  Negative for cough, chest tightness and shortness of breath.   Cardiovascular:  Negative for chest pain, palpitations and leg swelling.  Gastrointestinal:  Negative for abdominal pain, diarrhea, nausea and vomiting.  Genitourinary:  Negative for difficulty urinating and dysuria.  Musculoskeletal:  Negative for joint swelling and myalgias.  Skin:  Negative for color change and rash.  Neurological:  Negative for dizziness and headaches.  Psychiatric/Behavioral:  Negative for agitation and dysphoric mood.        Objective:     BP 116/68   Pulse 80   Temp 98 F (36.7 C)   Resp 16   Ht 5' 4 (1.626 m)   Wt 167 lb 3.2 oz (75.8 kg)   SpO2 (!) 65%   BMI 28.70 kg/m  Wt Readings from Last 3 Encounters:  10/28/23 167 lb 3.2 oz (75.8 kg)  10/14/23 170 lb 12.8 oz (77.5 kg)  10/07/23 175 lb (79.4 kg)    Physical Exam Vitals reviewed.  Constitutional:      General: She is not in acute distress.    Appearance: Normal appearance.  HENT:     Head: Normocephalic and atraumatic.     Right Ear: External ear normal.     Left  Ear: External ear normal.     Mouth/Throat:     Pharynx: No oropharyngeal exudate or posterior oropharyngeal erythema.   Eyes:     General: No scleral icterus.       Right eye: No discharge.        Left eye: No discharge.     Conjunctiva/sclera: Conjunctivae normal.   Neck:     Thyroid : No thyromegaly.   Cardiovascular:     Rate and Rhythm: Normal rate and regular rhythm.  Pulmonary:     Effort: No respiratory distress.     Breath sounds: Normal breath sounds. No wheezing.  Abdominal:     General: Bowel sounds are normal.     Palpations: Abdomen is soft.     Tenderness: There is no abdominal tenderness.   Musculoskeletal:        General: No swelling or tenderness.     Cervical back: Neck supple. No tenderness.  Lymphadenopathy:     Cervical: No cervical adenopathy.   Skin:    Findings: No erythema or rash.   Neurological:     Mental Status: She is alert.   Psychiatric:        Mood and Affect: Mood normal.        Behavior: Behavior normal.         Outpatient Encounter Medications as of 10/28/2023  Medication Sig   bisoprolol  (ZEBETA ) 5 MG tablet Take 5 mg by mouth daily. (Patient not taking: Reported on 10/14/2023)   Calcium  Carbonate-Vitamin D (CALTRATE 600+D PO) Take by mouth.   hydrochlorothiazide  (MICROZIDE ) 12.5 MG capsule Take 12.5 mg by mouth daily. (Patient not taking: Reported on 10/14/2023)   Nystatin  (GERHARDT'S BUTT CREAM) CREA Apply 1 Application topically 2 (two) times daily.   olmesartan  (BENICAR ) 40 MG tablet Take 1 tablet (40 mg total) by mouth daily.   Omega-3 Fatty Acids (FISH OIL) 1200 MG CAPS Take by mouth daily.   omeprazole  (PRILOSEC) 20 MG capsule Take 1 capsule (20 mg total) by mouth 2 (two) times daily.   rosuvastatin  (CRESTOR ) 20 MG tablet Take 1 tablet (20 mg total) by mouth daily.   sertraline  (ZOLOFT ) 50 MG tablet Take 1.5 tablets (75 mg total) by mouth daily.   [DISCONTINUED] sertraline  (ZOLOFT ) 50 MG tablet Take 1.5 tablets (75 mg  total) by mouth daily.   No facility-administered encounter medications on file as of 10/28/2023.     Lab Results  Component Value Date   WBC 7.1 10/14/2023   HGB 13.3 10/14/2023   HCT 39.7 10/14/2023   PLT 255.0 10/14/2023   GLUCOSE 91 10/28/2023   CHOL 118 08/09/2023   TRIG 153.0 (H) 08/09/2023   HDL 43.00 08/09/2023   LDLDIRECT 83.0 05/30/2018   LDLCALC 45 08/09/2023   ALT 19 10/28/2023   AST 25 10/28/2023   NA 140 10/28/2023   K 4.0 10/28/2023   CL 104 10/28/2023   CREATININE 0.83 10/28/2023   BUN 16 10/28/2023   CO2 28 10/28/2023   TSH 4.03 08/09/2023   HGBA1C 6.1 08/09/2023    CT Angio Chest PE W and/or Wo Contrast Result Date: 10/07/2023 EXAM: CTA of the Chest with contrast for PE 10/07/2023 06:50:38 PM TECHNIQUE: CTA of the chest was performed after the administration of 54mL iohexol  (OMNIPAQUE ) 350 MG/ML injection. Multiplanar reformatted images are provided for review. MIP images are provided for review. Automated exposure control, iterative reconstruction, and/or weight based adjustment of the mA/kV was utilized to reduce the radiation dose to as low as reasonably achievable. COMPARISON: Chest x-ray 10/07/2023 2 view CLINICAL HISTORY: Pulmonary embolism (PE) suspected, high prob. Cough, congestion and yellow diarrhea for the last seven days. FINDINGS: PULMONARY ARTERIES: Pulmonary arteries are adequately opacified for evaluation. No pulmonary embolism. Main pulmonary artery is normal in caliber. MEDIASTINUM: The heart and pericardium demonstrate no acute abnormality. Atherosclerotic changes are present at the distal aortic arch and descending thoracic aorta. No aneurysm or stenosis is present. Coronary artery calcifications are present. A large hiatal hernia is present. LYMPH NODES: No mediastinal, hilar or axillary lymphadenopathy. LUNGS AND PLEURA: Bilateral lower lobe bronchiectasis is noted. Patchy airspace opacities are present at  both lung bases. No evidence of pleural  effusion or pneumothorax. UPPER ABDOMEN: Limited images of the upper abdomen are unremarkable. SOFT TISSUES AND BONES: No acute bone or soft tissue abnormality. IMPRESSION: 1. No evidence of pulmonary embolism. 2. Bilateral lower lobe bronchiectasis and patchy airspace opacities at both lung bases. Findings are concerning for aspiration pneumonitis or pneumonia. 3. Large hiatal hernia. Electronically signed by: Audree Leas MD 10/07/2023 08:14 PM EDT RP Workstation: [id number]   DG Chest 2 View Result Date: 10/07/2023 CLINICAL DATA:  Cough congestion EXAM: CHEST - 2 VIEW COMPARISON:  None Available. FINDINGS: The heart size and mediastinal contours are within normal limits. Aortic atherosclerosis. Both lungs are clear. The visualized skeletal structures are unremarkable. Retrocardiac opacity likely reflects a hiatal hernia. IMPRESSION: No active cardiopulmonary disease. Probable hiatal hernia. Electronically Signed   By: Esmeralda Hedge M.D.   On: 10/07/2023 17:06       Assessment & Plan:  Abnormal liver function Assessment & Plan: AST/ALT slightly elevated last check after hospitalization. Feeling better today. Recheck liver panel today.   Orders: -     Hepatic function panel  Essential hypertension, benign Assessment & Plan: Remains off bisoprolol  and hydrochlorothiazide . Continues on avapro . Blood pressure doing well as outlined. Continue to spot check pressure. If blood pressure increases, can add back bisoprolol .   Orders: -     Basic metabolic panel with GFR  Anemia, unspecified type Assessment & Plan: Hgb wnl on recent check.    History of colon polyps Assessment & Plan: Colonoscopy 12/23/21 - Three 6 mm polyps in the sigmoid colon and in the descending colon - pathology tubular adenomas.    Hypercholesterolemia Assessment & Plan: On crestor  20mg  q day.  Low cholesterol diet and exercise. Will follow lipid panel.    Hyperglycemia Assessment & Plan: Low carb diet and  exercise. Follow met b and A1c.    Obsessive-compulsive disorder, unspecified type Assessment & Plan: Continue zoloft . Stable.    Other orders -     Sertraline  HCl; Take 1.5 tablets (75 mg total) by mouth daily.  Dispense: 135 tablet; Refill: 1     Dellar Fenton, MD

## 2023-10-29 ENCOUNTER — Ambulatory Visit: Payer: Self-pay | Admitting: Internal Medicine

## 2023-10-29 ENCOUNTER — Encounter: Payer: Self-pay | Admitting: Internal Medicine

## 2023-10-29 NOTE — Assessment & Plan Note (Signed)
 Low-carb diet and exercise.  Follow met b and A1c.

## 2023-10-29 NOTE — Assessment & Plan Note (Signed)
 AST/ALT slightly elevated last check after hospitalization. Feeling better today. Recheck liver panel today.

## 2023-10-29 NOTE — Assessment & Plan Note (Signed)
 On crestor  20mg  q day.  Low cholesterol diet and exercise. Will follow lipid panel.

## 2023-10-29 NOTE — Assessment & Plan Note (Signed)
 Continue zoloft.  Stable.

## 2023-10-29 NOTE — Assessment & Plan Note (Signed)
 Remains off bisoprolol  and hydrochlorothiazide . Continues on avapro . Blood pressure doing well as outlined. Continue to spot check pressure. If blood pressure increases, can add back bisoprolol .

## 2023-10-29 NOTE — Assessment & Plan Note (Signed)
 Hgb wnl on recent check.

## 2023-10-29 NOTE — Assessment & Plan Note (Signed)
Colonoscopy 12/23/21 - Three 6 mm polyps in the sigmoid colon and in the descending colon - pathology tubular adenomas.

## 2023-11-01 ENCOUNTER — Ambulatory Visit
Admission: RE | Admit: 2023-11-01 | Discharge: 2023-11-01 | Disposition: A | Discharge status: Home / Self Care | Source: Ambulatory Visit | Attending: Internal Medicine | Admitting: Internal Medicine

## 2023-11-01 DIAGNOSIS — E2839 Other primary ovarian failure: Secondary | ICD-10-CM

## 2023-11-01 DIAGNOSIS — M85852 Other specified disorders of bone density and structure, left thigh: Secondary | ICD-10-CM | POA: Diagnosis not present

## 2023-11-01 DIAGNOSIS — Z1231 Encounter for screening mammogram for malignant neoplasm of breast: Secondary | ICD-10-CM | POA: Insufficient documentation

## 2023-11-01 DIAGNOSIS — Z780 Asymptomatic menopausal state: Secondary | ICD-10-CM | POA: Diagnosis not present

## 2023-11-01 DIAGNOSIS — M85851 Other specified disorders of bone density and structure, right thigh: Secondary | ICD-10-CM | POA: Diagnosis not present

## 2023-11-02 ENCOUNTER — Ambulatory Visit: Payer: Self-pay | Admitting: Internal Medicine

## 2023-11-09 DIAGNOSIS — C44329 Squamous cell carcinoma of skin of other parts of face: Secondary | ICD-10-CM | POA: Diagnosis not present

## 2023-11-09 DIAGNOSIS — D0439 Carcinoma in situ of skin of other parts of face: Secondary | ICD-10-CM | POA: Diagnosis not present

## 2023-11-14 DIAGNOSIS — R32 Unspecified urinary incontinence: Secondary | ICD-10-CM | POA: Diagnosis not present

## 2023-11-17 DIAGNOSIS — C44519 Basal cell carcinoma of skin of other part of trunk: Secondary | ICD-10-CM | POA: Diagnosis not present

## 2023-11-29 ENCOUNTER — Encounter: Payer: Self-pay | Admitting: Internal Medicine

## 2023-11-29 NOTE — Telephone Encounter (Signed)
 Blood pressures varying.  Please confirm what blood pressure medication she is currently taking and document. Confirm if off - hydrochlorothiazide  and zebeta . Continue to spot check her pressures and schedule a f/u appt with me to discuss medication and medication adjustment.

## 2023-12-01 NOTE — Telephone Encounter (Signed)
 Please call her and confirm doing ok.  No acute symptoms. Her blood pressures are varying. Have her to continue to spot check her pressure and I would like to see her earlier. See if she can come 12/14/23 - 12:30 for work in appt.  Keep us  posted and if any change or worsening problems or if blood pressure increasing, will need earlier appt.

## 2023-12-01 NOTE — Telephone Encounter (Signed)
 Called patient to discuss but was told by family member that she was not home, she will call back this afternoon. Please relay message below when she returns call.

## 2023-12-05 ENCOUNTER — Telehealth: Payer: Self-pay

## 2023-12-05 NOTE — Telephone Encounter (Signed)
 Noted

## 2023-12-05 NOTE — Telephone Encounter (Signed)
 LM for patient to make sure message was relayed. Appt was scheduled but no documentation that patient was talked to.

## 2023-12-05 NOTE — Telephone Encounter (Signed)
 Per CRM, patient aware of below and will be at appt on 7/30

## 2023-12-05 NOTE — Telephone Encounter (Signed)
 Copied from CRM [id number]. Topic: General - Other >> Dec 05, 2023 10:58 AM Chasity T wrote: Reason for CRM: Patient is returning call from [place]. Advised per note that she received the message and is aware of appointment

## 2023-12-14 ENCOUNTER — Ambulatory Visit: Admitting: Internal Medicine

## 2023-12-14 VITALS — BP 138/78 | HR 79 | Resp 16 | Ht 64.0 in | Wt 166.8 lb

## 2023-12-14 DIAGNOSIS — R739 Hyperglycemia, unspecified: Secondary | ICD-10-CM | POA: Diagnosis not present

## 2023-12-14 DIAGNOSIS — N1831 Chronic kidney disease, stage 3a: Secondary | ICD-10-CM | POA: Diagnosis not present

## 2023-12-14 DIAGNOSIS — E7800 Pure hypercholesterolemia, unspecified: Secondary | ICD-10-CM

## 2023-12-14 DIAGNOSIS — I10 Essential (primary) hypertension: Secondary | ICD-10-CM

## 2023-12-14 DIAGNOSIS — R945 Abnormal results of liver function studies: Secondary | ICD-10-CM | POA: Diagnosis not present

## 2023-12-14 DIAGNOSIS — F429 Obsessive-compulsive disorder, unspecified: Secondary | ICD-10-CM

## 2023-12-14 MED ORDER — AMLODIPINE BESYLATE 2.5 MG PO TABS: 2.5000 mg | ORAL_TABLET | Freq: Every day | ORAL | 2 refills | Status: DC

## 2023-12-14 NOTE — Progress Notes (Signed)
 Subjective:    Patient ID: Lindsey Harmon, female    DOB: 03-06-1946, 78 y.o.   MRN: [id number]  Patient here for a scheduled follow up.   HPI Here for a scheduled follow up - follow up regarding OCD, hypertension and hypercholesterolemia. Continues on sertraline . Hospitalized 10/07/23 - 10/10/23 - admitted with CAP. CTA chest negative for PE but showing bilateral lower lobe bronchiectasis and patchy airspace opacities both lung bases concerning for aspiration pneumonitis or pneumonia. Treated with rocephin  and azithromycin . Stool culture positive for campylobacter - treated with azithromycin . Was taken off bisoprolol  and hydrochlorothiazide . Remained off since blood pressure doing well. Continued on benicar . Reports she is feeling better. No cough or congestion. No sob reported. No abdominal pain. Bowels better. Blood pressures have been remaining a little elevated. Reviewed outside readings - pressures averaging - 130-150/70-80.    Past Medical History:  Diagnosis Date   Abnormal liver function    Cancer (HCC)    Skin cancer   Cystocele, unspecified (CODE)    Fibrocystic breast disease    GERD (gastroesophageal reflux disease)    Hypertension    Intrinsic sphincter deficiency    Pelvic relaxation    Pure hypercholesterolemia    Rectocele    SUI (stress urinary incontinence, female)    Urinary retention    Urinary retention    Past Surgical History:  Procedure Laterality Date   ABDOMINAL HYSTERECTOMY     APPENDECTOMY     Bladder Tack     burch urethropexy  10/24/2000   laparoscopic colpopexy/culdoplasty   COLONOSCOPY WITH PROPOFOL  N/A 08/10/2016   Procedure: COLONOSCOPY WITH PROPOFOL ;  Surgeon: Gladis RAYMOND Mariner, MD;  Location: [facility] ENDOSCOPY;  Service: Endoscopy;  Laterality: N/A;   COLONOSCOPY WITH PROPOFOL  N/A 10/20/2016   Procedure: COLONOSCOPY WITH PROPOFOL ;  Surgeon: Dessa Reyes ORN, MD;  Location: [facility] ENDOSCOPY;  Service: Endoscopy;  Laterality: N/A;   COLONOSCOPY  WITH PROPOFOL  N/A 06/01/2017   Procedure: COLONOSCOPY WITH PROPOFOL ;  Surgeon: Dessa Reyes ORN, MD;  Location: [facility] ENDOSCOPY;  Service: Endoscopy;  Laterality: N/A;   COLONOSCOPY WITH PROPOFOL  N/A 12/23/2021   Procedure: COLONOSCOPY WITH PROPOFOL ;  Surgeon: Dessa Reyes ORN, MD;  Location: [facility] ENDOSCOPY;  Service: Endoscopy;  Laterality: N/A;   COLPORRHAPHY     forv repair cystocele anterior   EYE SURGERY  08/19/23 - 09/02/23   cataract surgery   TRANSVAGINAL TAPE (TVT) REMOVAL     Family History  Problem Relation Age of Onset   Pneumonia Mother        died of presumed aspiration pneumonia   Hypertension Mother    Hearing loss Mother    Cancer Father        prostate   Heart disease Paternal Grandfather        myocardial infarction   Alzheimer's disease Brother    Breast cancer Neg Hx    Social History   Socioeconomic History   Marital status: Married    Spouse name: Not on file   Number of children: Not on file   Years of education: Not on file   Highest education level: 12th grade  Occupational History   Not on file  Tobacco Use   Smoking status: Never   Smokeless tobacco: Never  Vaping Use   Vaping status: Never Used  Substance and Sexual Activity   Alcohol use: Never   Drug use: Never   Sexual activity: Not Currently    Birth control/protection: Abstinence  Other Topics Concern   Not on  file  Social History Narrative   married   Social Drivers of Corporate investment banker Strain: Low Risk  (12/12/2023)   Overall Financial Resource Strain (CARDIA)    Difficulty of Paying Living Expenses: Not very hard  Food Insecurity: No Food Insecurity (12/12/2023)   Hunger Vital Sign    Worried About Running Out of Food in the Last Year: Never true    Ran Out of Food in the Last Year: Never true  Transportation Needs: No Transportation Needs (12/12/2023)   PRAPARE - Administrator, Civil Service (Medical): No    Lack of Transportation (Non-Medical): No   Physical Activity: Unknown (12/12/2023)   Exercise Vital Sign    Days of Exercise per Week: Patient declined    Minutes of Exercise per Session: Not on file  Stress: No Stress Concern Present (12/12/2023)   [employer] of Occupational Health - Occupational Stress Questionnaire    Feeling of Stress: Only a little  Social Connections: Moderately Integrated (12/12/2023)   Social Connection and Isolation Panel    Frequency of Communication with Friends and Family: Twice a week    Frequency of Social Gatherings with Friends and Family: Once a week    Attends Religious Services: More than 4 times per year    Active Member of [employer] or Organizations: No    Attends Engineer, structural: Not on file    Marital Status: Married     Review of Systems  Constitutional:  Negative for appetite change and unexpected weight change.  HENT:  Negative for congestion and sinus pressure.   Respiratory:  Negative for cough, chest tightness and shortness of breath.   Cardiovascular:  Negative for chest pain, palpitations and leg swelling.  Gastrointestinal:  Negative for abdominal pain, diarrhea, nausea and vomiting.  Genitourinary:  Negative for difficulty urinating and dysuria.  Musculoskeletal:  Negative for joint swelling and myalgias.  Skin:  Negative for color change and rash.  Neurological:  Negative for dizziness, weakness and headaches.  Psychiatric/Behavioral:  Negative for agitation and dysphoric mood.        Objective:     BP 138/78   Pulse 79   Resp 16   Ht 5' 4 (1.626 m)   Wt 166 lb 12.8 oz (75.7 kg)   SpO2 98%   BMI 28.63 kg/m  Wt Readings from Last 3 Encounters:  12/14/23 166 lb 12.8 oz (75.7 kg)  10/28/23 167 lb 3.2 oz (75.8 kg)  10/14/23 170 lb 12.8 oz (77.5 kg)    Physical Exam Vitals reviewed.  Constitutional:      General: She is not in acute distress.    Appearance: Normal appearance.  HENT:     Head: Normocephalic and atraumatic.     Right Ear:  External ear normal.     Left Ear: External ear normal.     Mouth/Throat:     Pharynx: No oropharyngeal exudate or posterior oropharyngeal erythema.  Eyes:     General: No scleral icterus.       Right eye: No discharge.        Left eye: No discharge.     Conjunctiva/sclera: Conjunctivae normal.  Neck:     Thyroid : No thyromegaly.  Cardiovascular:     Rate and Rhythm: Normal rate and regular rhythm.  Pulmonary:     Effort: No respiratory distress.     Breath sounds: Normal breath sounds. No wheezing.  Abdominal:     General: Bowel sounds are normal.  Palpations: Abdomen is soft.     Tenderness: There is no abdominal tenderness.  Musculoskeletal:        General: No swelling or tenderness.     Cervical back: Neck supple. No tenderness.  Lymphadenopathy:     Cervical: No cervical adenopathy.  Skin:    Findings: No erythema or rash.  Neurological:     Mental Status: She is alert.  Psychiatric:        Mood and Affect: Mood normal.        Behavior: Behavior normal.         Outpatient Encounter Medications as of 12/14/2023  Medication Sig   amLODipine  (NORVASC ) 2.5 MG tablet Take 1 tablet (2.5 mg total) by mouth daily.   cephALEXin  (KEFLEX ) 250 MG capsule Take 250 mg by mouth daily.   bisoprolol  (ZEBETA ) 5 MG tablet Take 5 mg by mouth daily. (Patient not taking: Reported on 10/14/2023)   Calcium  Carbonate-Vitamin D (CALTRATE 600+D PO) Take by mouth.   olmesartan  (BENICAR ) 40 MG tablet Take 1 tablet (40 mg total) by mouth daily.   Omega-3 Fatty Acids (FISH OIL) 1200 MG CAPS Take by mouth daily.   omeprazole  (PRILOSEC) 20 MG capsule Take 1 capsule (20 mg total) by mouth 2 (two) times daily.   rosuvastatin  (CRESTOR ) 20 MG tablet Take 1 tablet (20 mg total) by mouth daily.   sertraline  (ZOLOFT ) 50 MG tablet Take 1.5 tablets (75 mg total) by mouth daily.   [DISCONTINUED] hydrochlorothiazide  (MICROZIDE ) 12.5 MG capsule Take 12.5 mg by mouth daily. (Patient not taking: Reported on  10/14/2023)   [DISCONTINUED] Nystatin  (GERHARDT'S BUTT CREAM) CREA Apply 1 Application topically 2 (two) times daily.   No facility-administered encounter medications on file as of 12/14/2023.     Lab Results  Component Value Date   WBC 7.1 10/14/2023   HGB 13.3 10/14/2023   HCT 39.7 10/14/2023   PLT 255.0 10/14/2023   GLUCOSE 91 10/28/2023   CHOL 118 08/09/2023   TRIG 153.0 (H) 08/09/2023   HDL 43.00 08/09/2023   LDLDIRECT 83.0 05/30/2018   LDLCALC 45 08/09/2023   ALT 19 10/28/2023   AST 25 10/28/2023   NA 140 10/28/2023   K 4.0 10/28/2023   CL 104 10/28/2023   CREATININE 0.83 10/28/2023   BUN 16 10/28/2023   CO2 28 10/28/2023   TSH 4.03 08/09/2023   HGBA1C 6.1 08/09/2023    MM 3D SCREENING MAMMOGRAM BILATERAL BREAST Result Date: 11/02/2023 CLINICAL DATA:  Screening. EXAM: DIGITAL SCREENING BILATERAL MAMMOGRAM WITH TOMOSYNTHESIS AND CAD TECHNIQUE: Bilateral screening digital craniocaudal and mediolateral oblique mammograms were obtained. Bilateral screening digital breast tomosynthesis was performed. The images were evaluated with computer-aided detection. COMPARISON:  Previous exam(s). ACR Breast Density Category b: There are scattered areas of fibroglandular density. FINDINGS: There are no findings suspicious for malignancy. IMPRESSION: No mammographic evidence of malignancy. A result letter of this screening mammogram will be mailed directly to the patient. RECOMMENDATION: Screening mammogram in one year. (Code:SM-B-01Y) BI-RADS CATEGORY  1: Negative. Electronically Signed   By: Reyes Phi M.D.   On: 11/02/2023 17:12   DG Bone Density Result Date: 11/01/2023 EXAM: DUAL X-RAY ABSORPTIOMETRY (DXA) FOR BONE MINERAL DENSITY 11/01/2023 10:58 am CLINICAL DATA:  78 year old Female Postmenopausal. Estrogen deficiency TECHNIQUE: An axial (e.g., hips, spine) and/or appendicular (e.g., radius) exam was performed, as appropriate, using GE Secretary/administrator at [facility]. Images are obtained for bone mineral density measurement and are not obtained for diagnostic purposes.  [id number] Exclusions: None. COMPARISON:  None. FINDINGS: Scan quality: Good. LUMBAR SPINE (L1-L4): BMD (in g/cm2): 1.128 T-score: -0.5 Z-score: 1.3 LEFT FEMORAL NECK: BMD (in g/cm2): 0.727 T-score: -2.2 Z-score: -0.1 LEFT TOTAL HIP: BMD (in g/cm2): 0.742 T-score: -2.1 Z-score: -0.1 RIGHT FEMORAL NECK: BMD (in g/cm2): 0.924 T-score: -0.8 Z-score: 1.3 RIGHT TOTAL HIP: BMD (in g/cm2): 0.825 T-score: -1.5 Z-score: 0.6 FRAX 10-YEAR PROBABILITY OF FRACTURE: 10-year fracture risk is performed using the [employer] FRAX calculator based on patient-reported risk factors. Major osteoporotic fracture: 31.5% Hip fracture: 21.0% Other situations known to alter the reliability of the FRAX score should be considered when making treatment decisions, including chronic glucocorticoid use and past treatments. Further guidance on treatment can be found at the [facility]oporosis Foundation's website [email or web address]/. IMPRESSION: Osteopenia based on BMD. Fracture risk is increased. RECOMMENDATIONS: 1. All patients should optimize calcium  and vitamin D intake. 2. Consider FDA-approved medical therapies in postmenopausal women and men aged 72 years and older, based on the following: - A hip or vertebral (clinical or morphometric) fracture - T-score less than or equal to -2.5 and secondary causes have been excluded. - Low bone mass (T-score between -1.0 and -2.5) and a 10-year probability of a hip fracture greater than or equal to 3% or a 10-year probability of a major osteoporosis-related fracture greater than or equal to 20% based on the [place] -adapted WHO algorithm. - Clinician judgment and/or patient preferences may indicate treatment for people with 10-year fracture probabilities above or below these levels 3. Patients with diagnosis of osteoporosis or at high risk for fracture should have regular bone mineral  density tests. For patients eligible for Medicare, routine testing is allowed once every 2 years. The testing frequency can be increased to one year for patients who have rapidly progressing disease, those who are receiving or discontinuing medical therapy to restore bone mass, or have additional risk factors. Electronically Signed   By: Reyes Phi M.D.   On: 11/01/2023 12:39       Assessment & Plan:  Abnormal liver function Assessment & Plan: Liver panel 10/28/23 - wnl.    Stage 3a chronic kidney disease (HCC) Assessment & Plan: Continue to avoid antiinflammatories.  Stay hydrated.  Follow metabolic panel. Continue benicar .    Essential hypertension, benign Assessment & Plan: Recent admission as outlined.  Off bisoprolol  and hydrochlorothiazide . Blood pressures reviewed. Averaging higher than goal. Will add amlodipine  2.5mg  q day. Follow pressures. Follow metabolic panel.    Obsessive-compulsive disorder, unspecified type Assessment & Plan: Stable. Continue zoloft .    Hyperglycemia Assessment & Plan: Low carb diet and exercise. Follow met b and A1c.    Hypercholesterolemia Assessment & Plan: Continue crestor . Diet and exercise. Follow lipid panel.    Other orders -     amLODIPine  Besylate; Take 1 tablet (2.5 mg total) by mouth daily.  Dispense: 30 tablet; Refill: 2     Allena Hamilton, MD

## 2023-12-14 NOTE — Patient Instructions (Signed)
 Start amlodipine  2.5mg  - take one per day.

## 2023-12-18 ENCOUNTER — Encounter: Payer: Self-pay | Admitting: Internal Medicine

## 2023-12-18 NOTE — Assessment & Plan Note (Signed)
 Stable  Continue zoloft

## 2023-12-18 NOTE — Assessment & Plan Note (Signed)
 Continue crestor . Diet and exercise. Follow lipid panel.

## 2023-12-18 NOTE — Assessment & Plan Note (Signed)
 Continue to avoid antiinflammatories.  Stay hydrated.  Follow metabolic panel. Continue benicar .

## 2023-12-18 NOTE — Assessment & Plan Note (Signed)
 Liver panel 10/28/23 - wnl.

## 2023-12-18 NOTE — Assessment & Plan Note (Signed)
 Low-carb diet and exercise.  Follow met b and A1c.

## 2023-12-18 NOTE — Assessment & Plan Note (Signed)
 Recent admission as outlined.  Off bisoprolol  and hydrochlorothiazide . Blood pressures reviewed. Averaging higher than goal. Will add amlodipine  2.5mg  q day. Follow pressures. Follow metabolic panel.

## 2023-12-23 ENCOUNTER — Other Ambulatory Visit

## 2023-12-28 ENCOUNTER — Ambulatory Visit: Admitting: Internal Medicine

## 2024-01-25 ENCOUNTER — Encounter: Payer: Self-pay | Admitting: Internal Medicine

## 2024-02-06 DIAGNOSIS — R32 Unspecified urinary incontinence: Secondary | ICD-10-CM | POA: Diagnosis not present

## 2024-02-15 ENCOUNTER — Other Ambulatory Visit (INDEPENDENT_AMBULATORY_CARE_PROVIDER_SITE_OTHER)

## 2024-02-15 DIAGNOSIS — E7800 Pure hypercholesterolemia, unspecified: Secondary | ICD-10-CM | POA: Diagnosis not present

## 2024-02-15 DIAGNOSIS — R739 Hyperglycemia, unspecified: Secondary | ICD-10-CM | POA: Diagnosis not present

## 2024-02-15 DIAGNOSIS — I10 Essential (primary) hypertension: Secondary | ICD-10-CM

## 2024-02-15 NOTE — Addendum Note (Signed)
 Addended by: MARYLEN PRO A on: 02/15/2024 08:45 AM   Modules accepted: Orders

## 2024-02-16 LAB — HEPATIC FUNCTION PANEL
AG Ratio: 2.2 (calc) (ref 1.0–2.5)
ALT: 24 U/L (ref 6–29)
AST: 25 U/L (ref 10–35)
Albumin: 4.4 g/dL (ref 3.6–5.1)
Alkaline phosphatase (APISO): 80 U/L (ref 37–153)
Bilirubin, Direct: 0.1 mg/dL (ref 0.0–0.2)
Globulin: 2 g/dL (ref 1.9–3.7)
Indirect Bilirubin: 0.5 mg/dL (ref 0.2–1.2)
Total Bilirubin: 0.6 mg/dL (ref 0.2–1.2)
Total Protein: 6.4 g/dL (ref 6.1–8.1)

## 2024-02-16 LAB — BASIC METABOLIC PANEL WITH GFR
BUN: 19 mg/dL (ref 7–25)
CO2: 31 mmol/L (ref 20–32)
Calcium: 9.6 mg/dL (ref 8.6–10.4)
Chloride: 104 mmol/L (ref 98–110)
Creat: 0.92 mg/dL (ref 0.60–0.95)
Glucose, Bld: 100 mg/dL — ABNORMAL HIGH (ref 65–99)
Potassium: 4.1 mmol/L (ref 3.5–5.3)
Sodium: 140 mmol/L (ref 135–146)
eGFR: 63 mL/min/1.73m2 (ref >=60)

## 2024-02-16 LAB — LIPID PANEL
Cholesterol: 123 mg/dL (ref <200)
HDL: 52 mg/dL (ref >=50)
LDL Cholesterol (Calc): 49 mg/dL
Non-HDL Cholesterol (Calc): 71 mg/dL (ref <130)
Total CHOL/HDL Ratio: 2.4 (calc) (ref <5.0)
Triglycerides: 136 mg/dL (ref <150)

## 2024-02-16 LAB — HEMOGLOBIN A1C
Hgb A1c MFr Bld: 6 % — ABNORMAL HIGH (ref <5.7)
Mean Plasma Glucose: 126 mg/dL
eAG (mmol/L): 7 mmol/L

## 2024-02-17 ENCOUNTER — Ambulatory Visit: Admitting: Internal Medicine

## 2024-02-17 VITALS — BP 128/72 | HR 82 | Resp 16 | Ht 64.0 in | Wt 168.2 lb

## 2024-02-17 DIAGNOSIS — F429 Obsessive-compulsive disorder, unspecified: Secondary | ICD-10-CM | POA: Diagnosis not present

## 2024-02-17 DIAGNOSIS — Z789 Other specified health status: Secondary | ICD-10-CM

## 2024-02-17 DIAGNOSIS — I10 Essential (primary) hypertension: Secondary | ICD-10-CM | POA: Diagnosis not present

## 2024-02-17 DIAGNOSIS — Z23 Encounter for immunization: Secondary | ICD-10-CM | POA: Diagnosis not present

## 2024-02-17 DIAGNOSIS — R739 Hyperglycemia, unspecified: Secondary | ICD-10-CM

## 2024-02-17 DIAGNOSIS — D649 Anemia, unspecified: Secondary | ICD-10-CM | POA: Diagnosis not present

## 2024-02-17 DIAGNOSIS — E7800 Pure hypercholesterolemia, unspecified: Secondary | ICD-10-CM | POA: Diagnosis not present

## 2024-02-17 MED ORDER — MUPIROCIN 2 % EX OINT: 1.0000 | TOPICAL_OINTMENT | Freq: Two times a day (BID) | CUTANEOUS | 0 refills | Status: DC

## 2024-02-17 MED ORDER — AMLODIPINE BESYLATE 5 MG PO TABS: 5.0000 mg | ORAL_TABLET | Freq: Every day | ORAL | 1 refills | Status: DC

## 2024-02-17 NOTE — Progress Notes (Signed)
 Subjective:    Patient ID: Lindsey Harmon, female    DOB: [birth year], 78 y.o.   MRN: [id number]  Patient here for  Chief Complaint  Patient presents with   Medical Management of Chronic Issues    HPI Here for a scheduled follow up - follow up regarding OCD, hypertension and hypercholesterolemia. Continues on sertraline . Last visit, blood pressure elevated. Added amlodipine  2.5mg  q day. Tolerating. Outside blood pressures reviewed. Most readings 120-140s/60-70s. Most recent readings 140/78 and 142/70. Tolerating the medication. No chest pain. No sob. No abdominal pain. Bowels moving.    Past Medical History:  Diagnosis Date   Abnormal liver function    Cancer (HCC)    Skin cancer   Cystocele, unspecified (CODE)    Fibrocystic breast disease    GERD (gastroesophageal reflux disease)    Hypertension    Intrinsic sphincter deficiency    Pelvic relaxation    Pure hypercholesterolemia    Rectocele    SUI (stress urinary incontinence, female)    Urinary retention    Urinary retention    Past Surgical History:  Procedure Laterality Date   ABDOMINAL HYSTERECTOMY     APPENDECTOMY     Bladder Tack     burch urethropexy  10/24/2000   laparoscopic colpopexy/culdoplasty   COLONOSCOPY WITH PROPOFOL  N/A 08/10/2016   Procedure: COLONOSCOPY WITH PROPOFOL ;  Surgeon: Gladis RAYMOND Mariner, MD;  Location: [facility] ENDOSCOPY;  Service: Endoscopy;  Laterality: N/A;   COLONOSCOPY WITH PROPOFOL  N/A 10/20/2016   Procedure: COLONOSCOPY WITH PROPOFOL ;  Surgeon: Dessa Reyes ORN, MD;  Location: [facility] ENDOSCOPY;  Service: Endoscopy;  Laterality: N/A;   COLONOSCOPY WITH PROPOFOL  N/A 06/01/2017   Procedure: COLONOSCOPY WITH PROPOFOL ;  Surgeon: Dessa Reyes ORN, MD;  Location: [facility] ENDOSCOPY;  Service: Endoscopy;  Laterality: N/A;   COLONOSCOPY WITH PROPOFOL  N/A 12/23/2021   Procedure: COLONOSCOPY WITH PROPOFOL ;  Surgeon: Dessa Reyes ORN, MD;  Location: [facility] ENDOSCOPY;  Service: Endoscopy;  Laterality:  N/A;   COLPORRHAPHY     forv repair cystocele anterior   EYE SURGERY  08/19/23 - 09/02/23   cataract surgery   TRANSVAGINAL TAPE (TVT) REMOVAL     Family History  Problem Relation Age of Onset   Pneumonia Mother        died of presumed aspiration pneumonia   Hypertension Mother    Hearing loss Mother    Cancer Father        prostate   Heart disease Paternal Grandfather        myocardial infarction   Alzheimer's disease Brother    Breast cancer Neg Hx    Social History   Socioeconomic History   Marital status: Married    Spouse name: Not on file   Number of children: Not on file   Years of education: Not on file   Highest education level: 12th grade  Occupational History   Not on file  Tobacco Use   Smoking status: Never   Smokeless tobacco: Never  Vaping Use   Vaping status: Never Used  Substance and Sexual Activity   Alcohol use: Never   Drug use: Never   Sexual activity: Not Currently    Birth control/protection: Abstinence  Other Topics Concern   Not on file  Social History Narrative   married   Social Drivers of Corporate investment banker Strain: Low Risk  (12/12/2023)   Overall Financial Resource Strain (CARDIA)    Difficulty of Paying Living Expenses: Not very hard  Food Insecurity: No Food Insecurity (  12/12/2023)   Hunger Vital Sign    Worried About Running Out of Food in the Last Year: Never true    Ran Out of Food in the Last Year: Never true  Transportation Needs: No Transportation Needs (12/12/2023)   PRAPARE - Administrator, Civil Service (Medical): No    Lack of Transportation (Non-Medical): No  Physical Activity: Unknown (12/12/2023)   Exercise Vital Sign    Days of Exercise per Week: Patient declined    Minutes of Exercise per Session: Not on file  Stress: No Stress Concern Present (12/12/2023)   [employer] of Occupational Health - Occupational Stress Questionnaire    Feeling of Stress: Only a little  Social Connections:  Moderately Integrated (12/12/2023)   Social Connection and Isolation Panel    Frequency of Communication with Friends and Family: Twice a week    Frequency of Social Gatherings with Friends and Family: Once a week    Attends Religious Services: More than 4 times per year    Active Member of [employer] or Organizations: No    Attends Engineer, structural: Not on file    Marital Status: Married     Review of Systems  Constitutional:  Negative for appetite change and unexpected weight change.  HENT:  Negative for congestion and sinus pressure.   Respiratory:  Negative for cough, chest tightness and shortness of breath.   Cardiovascular:  Negative for chest pain, palpitations and leg swelling.  Gastrointestinal:  Negative for abdominal pain, diarrhea, nausea and vomiting.  Genitourinary:  Negative for difficulty urinating and dysuria.  Musculoskeletal:  Negative for joint swelling and myalgias.  Skin:  Negative for color change and rash.  Neurological:  Negative for dizziness and headaches.  Psychiatric/Behavioral:  Negative for agitation and dysphoric mood.        Objective:     BP 128/72   Pulse 82   Resp 16   Ht 5' 4 (1.626 m)   Wt 168 lb 3.2 oz (76.3 kg)   SpO2 99%   BMI 28.87 kg/m  Wt Readings from Last 3 Encounters:  02/17/24 168 lb 3.2 oz (76.3 kg)  12/14/23 166 lb 12.8 oz (75.7 kg)  10/28/23 167 lb 3.2 oz (75.8 kg)    Physical Exam Vitals reviewed.  Constitutional:      General: She is not in acute distress.    Appearance: Normal appearance.  HENT:     Head: Normocephalic and atraumatic.     Right Ear: External ear normal.     Left Ear: External ear normal.     Mouth/Throat:     Pharynx: No oropharyngeal exudate or posterior oropharyngeal erythema.  Eyes:     General: No scleral icterus.       Right eye: No discharge.        Left eye: No discharge.     Conjunctiva/sclera: Conjunctivae normal.  Neck:     Thyroid : No thyromegaly.  Cardiovascular:      Rate and Rhythm: Normal rate and regular rhythm.  Pulmonary:     Effort: No respiratory distress.     Breath sounds: Normal breath sounds. No wheezing.  Abdominal:     General: Bowel sounds are normal.     Palpations: Abdomen is soft.     Tenderness: There is no abdominal tenderness.  Musculoskeletal:        General: No swelling or tenderness.     Cervical back: Neck supple. No tenderness.  Lymphadenopathy:     Cervical:  No cervical adenopathy.  Skin:    Findings: No erythema or rash.  Neurological:     Mental Status: She is alert.  Psychiatric:        Mood and Affect: Mood normal.        Behavior: Behavior normal.         Outpatient Encounter Medications as of 02/17/2024  Medication Sig   amLODipine  (NORVASC ) 5 MG tablet Take 1 tablet (5 mg total) by mouth daily.   mupirocin ointment (BACTROBAN) 2 % Apply 1 Application topically 2 (two) times daily.   Calcium  Carbonate-Vitamin D (CALTRATE 600+D PO) Take by mouth.   cephALEXin  (KEFLEX ) 250 MG capsule Take 250 mg by mouth daily.   olmesartan  (BENICAR ) 40 MG tablet Take 1 tablet (40 mg total) by mouth daily.   Omega-3 Fatty Acids (FISH OIL) 1200 MG CAPS Take by mouth daily.   omeprazole  (PRILOSEC) 20 MG capsule Take 1 capsule (20 mg total) by mouth 2 (two) times daily.   rosuvastatin  (CRESTOR ) 20 MG tablet Take 1 tablet (20 mg total) by mouth daily.   sertraline  (ZOLOFT ) 50 MG tablet Take 1.5 tablets (75 mg total) by mouth daily.   [DISCONTINUED] amLODipine  (NORVASC ) 2.5 MG tablet Take 1 tablet (2.5 mg total) by mouth daily.   [DISCONTINUED] bisoprolol  (ZEBETA ) 5 MG tablet Take 5 mg by mouth daily. (Patient not taking: Reported on 10/14/2023)   No facility-administered encounter medications on file as of 02/17/2024.     Lab Results  Component Value Date   WBC 7.1 10/14/2023   HGB 13.3 10/14/2023   HCT 39.7 10/14/2023   PLT 255.0 10/14/2023   GLUCOSE 100 (H) 02/15/2024   CHOL 123 02/15/2024   TRIG 136 02/15/2024   HDL 52  02/15/2024   LDLDIRECT 83.0 05/30/2018   LDLCALC 49 02/15/2024   ALT 24 02/15/2024   AST 25 02/15/2024   NA 140 02/15/2024   K 4.1 02/15/2024   CL 104 02/15/2024   CREATININE 0.92 02/15/2024   BUN 19 02/15/2024   CO2 31 02/15/2024   TSH 4.03 08/09/2023   HGBA1C 6.0 (H) 02/15/2024    MM 3D SCREENING MAMMOGRAM BILATERAL BREAST Result Date: 11/02/2023 CLINICAL DATA:  Screening. EXAM: DIGITAL SCREENING BILATERAL MAMMOGRAM WITH TOMOSYNTHESIS AND CAD TECHNIQUE: Bilateral screening digital craniocaudal and mediolateral oblique mammograms were obtained. Bilateral screening digital breast tomosynthesis was performed. The images were evaluated with computer-aided detection. COMPARISON:  Previous exam(s). ACR Breast Density Category b: There are scattered areas of fibroglandular density. FINDINGS: There are no findings suspicious for malignancy. IMPRESSION: No mammographic evidence of malignancy. A result letter of this screening mammogram will be mailed directly to the patient. RECOMMENDATION: Screening mammogram in one year. (Code:SM-B-01Y) BI-RADS CATEGORY  1: Negative. Electronically Signed   By: Reyes Phi M.D.   On: 11/02/2023 17:12   DG Bone Density Result Date: 11/01/2023 EXAM: DUAL X-RAY ABSORPTIOMETRY (DXA) FOR BONE MINERAL DENSITY 11/01/2023 10:58 am CLINICAL DATA:  78 year old Female Postmenopausal. Estrogen deficiency TECHNIQUE: An axial (e.g., hips, spine) and/or appendicular (e.g., radius) exam was performed, as appropriate, using GE Secretary/administrator at [facility]. Images are obtained for bone mineral density measurement and are not obtained for diagnostic purposes. [id number] Exclusions: None. COMPARISON:  None. FINDINGS: Scan quality: Good. LUMBAR SPINE (L1-L4): BMD (in g/cm2): 1.128 T-score: -0.5 Z-score: 1.3 LEFT FEMORAL NECK: BMD (in g/cm2): 0.727 T-score: -2.2 Z-score: -0.1 LEFT TOTAL HIP: BMD (in g/cm2): 0.742 T-score: -2.1 Z-score: -0.1 RIGHT FEMORAL NECK:  BMD (  in g/cm2): 0.924 T-score: -0.8 Z-score: 1.3 RIGHT TOTAL HIP: BMD (in g/cm2): 0.825 T-score: -1.5 Z-score: 0.6 FRAX 10-YEAR PROBABILITY OF FRACTURE: 10-year fracture risk is performed using the [facility] FRAX calculator based on patient-reported risk factors. Major osteoporotic fracture: 31.5% Hip fracture: 21.0% Other situations known to alter the reliability of the FRAX score should be considered when making treatment decisions, including chronic glucocorticoid use and past treatments. Further guidance on treatment can be found at the [facility] Foundation's website [email or web address]://[email or web address].com/. IMPRESSION: Osteopenia based on BMD. Fracture risk is increased. RECOMMENDATIONS: 1. All patients should optimize calcium  and vitamin D intake. 2. Consider FDA-approved medical therapies in postmenopausal women and men aged 78 years and older, based on the following: - A hip or vertebral (clinical or morphometric) fracture - T-score less than or equal to -2.5 and secondary causes have been excluded. - Low bone mass (T-score between -1.0 and -2.5) and a 10-year probability of a hip fracture greater than or equal to 3% or a 10-year probability of a major osteoporosis-related fracture greater than or equal to 20% based on the [place] -adapted WHO algorithm. - Clinician judgment and/or patient preferences may indicate treatment for people with 10-year fracture probabilities above or below these levels 3. Patients with diagnosis of osteoporosis or at high risk for fracture should have regular bone mineral density tests. For patients eligible for Medicare, routine testing is allowed once every 2 years. The testing frequency can be increased to one year for patients who have rapidly progressing disease, those who are receiving or discontinuing medical therapy to restore bone mass, or have additional risk factors. Electronically Signed   By: Reyes Phi M.D.   On: 11/01/2023 12:39       Assessment & Plan:   Immunization due -     Flu vaccine HIGH DOSE PF(Fluzone Trivalent)  Hypercholesterolemia Assessment & Plan: Continue crestor . Diet and exercise. Follow lipid panel.  Discussed recent labs.  Lab Results  Component Value Date   CHOL 123 02/15/2024   HDL 52 02/15/2024   LDLCALC 49 02/15/2024   LDLDIRECT 83.0 05/30/2018   TRIG 136 02/15/2024   CHOLHDL 2.4 02/15/2024     Orders: -     Hepatic function panel; Future -     Lipid panel; Future  Hyperglycemia Assessment & Plan: Low carb diet and exercise. Follow met b and A1c.   Orders: -     Hemoglobin A1c; Future  Essential hypertension, benign Assessment & Plan: Recently added amlodipine  2.5mg  q day. Blood pressures as outlined. Given persistent elevation, increase to 5mg  q day. Follow pressures. Follow metabolic panel.   Orders: -     Basic metabolic panel with GFR; Future  Self-catheterizes urinary bladder Assessment & Plan: Continues daily suppressive abx. Stable.    Obsessive-compulsive disorder, unspecified type Assessment & Plan: Continue zoloft . Stable.    Anemia, unspecified type Assessment & Plan: Hgb 10/14/23 - wnl.    Other orders -     amLODIPine  Besylate; Take 1 tablet (5 mg total) by mouth daily.  Dispense: 90 tablet; Refill: 1 -     Mupirocin; Apply 1 Application topically 2 (two) times daily.  Dispense: 22 g; Refill: 0     Allena Hamilton, MD

## 2024-02-19 ENCOUNTER — Encounter: Payer: Self-pay | Admitting: Internal Medicine

## 2024-02-19 NOTE — Assessment & Plan Note (Signed)
 Continues daily suppressive abx. Stable.

## 2024-02-19 NOTE — Assessment & Plan Note (Signed)
 Continue crestor . Diet and exercise. Follow lipid panel.  Discussed recent labs.  Lab Results  Component Value Date   CHOL 123 02/15/2024   HDL 52 02/15/2024   LDLCALC 49 02/15/2024   LDLDIRECT 83.0 05/30/2018   TRIG 136 02/15/2024   CHOLHDL 2.4 02/15/2024

## 2024-02-19 NOTE — Assessment & Plan Note (Signed)
 Hgb 10/14/23 - wnl.

## 2024-02-19 NOTE — Assessment & Plan Note (Signed)
 Low-carb diet and exercise.  Follow met b and A1c.

## 2024-02-19 NOTE — Assessment & Plan Note (Signed)
 Continue zoloft.  Stable.

## 2024-02-19 NOTE — Assessment & Plan Note (Signed)
 Recently added amlodipine  2.5mg  q day. Blood pressures as outlined. Given persistent elevation, increase to 5mg  q day. Follow pressures. Follow metabolic panel.

## 2024-02-27 ENCOUNTER — Ambulatory Visit: Admitting: Family Medicine

## 2024-02-27 ENCOUNTER — Encounter: Payer: Self-pay | Admitting: Family Medicine

## 2024-02-27 VITALS — BP 130/65 | HR 95 | Temp 98.4°F | Wt 165.1 lb

## 2024-02-27 DIAGNOSIS — J029 Acute pharyngitis, unspecified: Secondary | ICD-10-CM

## 2024-02-27 DIAGNOSIS — J069 Acute upper respiratory infection, unspecified: Secondary | ICD-10-CM

## 2024-02-27 LAB — POCT RAPID STREP A (OFFICE): Rapid Strep A Screen: NEGATIVE

## 2024-02-27 NOTE — Progress Notes (Signed)
 Established patient visit   Patient: Lindsey Harmon   DOB: [birth year]   78 y.o. Female  MRN: [id number] Visit Date: 02/27/2024  Today's healthcare provider: Nancyann Perry, MD   Chief Complaint  Patient presents with   URI    Symptoms: sore throat, cough, congestion. Frequency: since Thursday   Subjective    Discussed the use of AI scribe software for clinical note transcription with the patient, who gave verbal consent to proceed.  History of Present Illness   Lindsey Harmon is an 78 year old female who presents with a sore throat, headache, and cough.  She has been experiencing a sore throat, headache, and cough since last Thursday or Friday. The sore throat began with discomfort in her left ear and moved downwards. No significant fever, as her thermometer is not working, but she does not believe she has had one. Nasal congestion has caused some difficulty breathing, but there is no significant respiratory distress.  She was hospitalized for pneumonia at the end of May and is concerned about her current symptoms potentially leading to a similar condition. She received her flu shot a couple of weeks ago and plans to get a COVID vaccine once her current symptoms resolve.  Her cough has been persistent and kept her up last night. She has been taking Tylenol , two in the morning and two at night, to manage her headache and sore throat. She is cautious about medication interactions due to her high blood pressure.  No current ear pain, but initially felt discomfort in her left ear when the sore throat began. Her sinuses are not painful, but she has a stuffy nose and a headache that started with her current illness.       Medications: Outpatient Medications Prior to Visit  Medication Sig   amLODipine  (NORVASC ) 5 MG tablet Take 1 tablet (5 mg total) by mouth daily.   Calcium  Carbonate-Vitamin D (CALTRATE 600+D PO) Take by mouth.   cephALEXin  (KEFLEX ) 250 MG capsule Take 250 mg by  mouth daily.   mupirocin ointment (BACTROBAN) 2 % Apply 1 Application topically 2 (two) times daily.   olmesartan  (BENICAR ) 40 MG tablet Take 1 tablet (40 mg total) by mouth daily.   Omega-3 Fatty Acids (FISH OIL) 1200 MG CAPS Take by mouth daily.   omeprazole  (PRILOSEC) 20 MG capsule Take 1 capsule (20 mg total) by mouth 2 (two) times daily.   rosuvastatin  (CRESTOR ) 20 MG tablet Take 1 tablet (20 mg total) by mouth daily.   sertraline  (ZOLOFT ) 50 MG tablet Take 1.5 tablets (75 mg total) by mouth daily.   No facility-administered medications prior to visit.   Review of Systems  Constitutional:  Negative for appetite change, chills, fatigue and fever.  Respiratory:  Negative for chest tightness and shortness of breath.   Cardiovascular:  Negative for chest pain and palpitations.  Gastrointestinal:  Negative for abdominal pain, nausea and vomiting.  Neurological:  Negative for dizziness and weakness.       Objective    BP 130/65 (BP Location: Left Arm, Patient Position: Sitting, Cuff Size: Normal)   Pulse 95   Temp 98.4 F (36.9 C) (Oral)   Wt 165 lb 1.6 oz (74.9 kg)   SpO2 97%   BMI 28.34 kg/m   Physical Exam   General Appearance:    Well developed, well nourished female, alert, cooperative, in no acute distress  HENT:   bilateral TM normal without fluid or infection, neck without nodes,  pharynx erythematous without exudate, sinuses nontender, and nasal mucosa congested  Eyes:    PERRL, conjunctiva/corneas clear, EOM's intact       Lungs:     Clear to auscultation bilaterally, respirations unlabored  Heart:    Normal heart rate. Normal rhythm. No murmurs, rubs, or gallops.    Neurologic:   Awake, alert, oriented x 3. No apparent focal neurological           defect.        Results for orders placed or performed in visit on 02/27/24  POCT rapid strep A  Result Value Ref Range   Rapid Strep A Screen Negative Negative  No solution for Covid or flu tests is available.     Assessment & Plan    1. Viral URI with cough (Primary) Recommend OTC Robitussin DM or cough prn.   Follow up with PCP if not rapidly improving or if sx worsen over the next several day.   2. Sore throat Negative strep test today.         Nancyann Perry, MD  [facility] [phone] (phone) [phone] (fax)  [facility]

## 2024-02-27 NOTE — Patient Instructions (Addendum)
 Please review the attached list of medications and notify my office if there are any errors.   You can take over the counter Robitussin DM or Delsym to help with the cough

## 2024-02-28 ENCOUNTER — Telehealth: Payer: Self-pay | Admitting: *Deleted

## 2024-02-28 ENCOUNTER — Ambulatory Visit: Payer: Medicare PPO | Admitting: *Deleted

## 2024-02-28 VITALS — Ht 62.0 in | Wt 165.0 lb

## 2024-02-28 DIAGNOSIS — Z0000 Encounter for general adult medical examination without abnormal findings: Secondary | ICD-10-CM | POA: Diagnosis not present

## 2024-02-28 NOTE — Telephone Encounter (Signed)
 Performed AWV While on the phone with the patient she stated that she has been sick. Patient called to get an appointment and was scheduled at [facility]. Patient stated that she started getting sick she thinks Friday. Patient stated that she has had a sore throat, headache, stuffy nose, head congestion and a non productive cough. Patient stated that they did a strep test on her that was negative. Patient stated that they were not able to do a covid test because she thinks that they did not have any. Patient stated that she was diagnosed with a virus. Patient denies a fever or SOB. Patient stated that she went because she was concerned because she had pneumonia last year and was real sick. Patient was given ER precautions and she verbalized understanding. Patient was advised that a note would be sent to Dr. Glendia so that she would be aware.

## 2024-02-28 NOTE — Telephone Encounter (Signed)
 She is doing ok. No sob. Having cough, sore throat, and headache since last Friday. Was strep negative yesterday at [facility]. Robitussin that was recommended is not helping her. She has been scheduled tomorrow with Rollene to be re-evaluated.

## 2024-02-28 NOTE — Patient Instructions (Signed)
 [name],  Thank you for taking the time for your Medicare Wellness Visit. I appreciate your continued commitment to your health goals. Please review the care plan we discussed, and feel free to reach out if I can assist you further.  Medicare recommends these wellness visits once per year to help you and your care team stay ahead of potential health issues. These visits are designed to focus on prevention, allowing your provider to concentrate on managing your acute and chronic conditions during your regular appointments.  Please note that Annual Wellness Visits do not include a physical exam. Some assessments may be limited, especially if the visit was conducted virtually. If needed, we may recommend a separate in-person follow-up with your provider.  Ongoing Care Seeing your primary care provider every 3 to 6 months helps us  monitor your health and provide consistent, personalized care.  Remember to update your covid, tetanus (Tdap) and shingles when are feeling better.   Referrals If a referral was made during today's visit and you haven't received any updates within two weeks, please contact the referred provider directly to check on the status.  Recommended Screenings:  Health Maintenance  Topic Date Due   DTaP/Tdap/Td vaccine (1 - Tdap) Never done   Zoster (Shingles) Vaccine (1 of 2) Never done   COVID-19 Vaccine (8 - 2025-26 season) 01/16/2024   Breast Cancer Screening  10/31/2024   Medicare Annual Wellness Visit  02/27/2025   Pneumococcal Vaccine for age over 78  Completed   Flu Shot  Completed   DEXA scan (bone density measurement)  Completed   Meningitis B Vaccine  Aged Out   Colon Cancer Screening  Discontinued       02/28/2024    2:02 PM  Advanced Directives  Does Patient Have a Medical Advance Directive? Yes  Type of Estate agent of [place];Living will  Does patient want to make changes to medical advance directive? No - Patient declined   Copy of Healthcare Power of Attorney in Chart? Yes - validated most recent copy scanned in chart (See row information)   Advance Care Planning is important because it: Ensures you receive medical care that aligns with your values, goals, and preferences. Provides guidance to your family and loved ones, reducing the emotional burden of decision-making during critical moments.  Vision: Annual vision screenings are recommended for early detection of glaucoma, cataracts, and diabetic retinopathy. These exams can also reveal signs of chronic conditions such as diabetes and high blood pressure.  Dental: Annual dental screenings help detect early signs of oral cancer, gum disease, and other conditions linked to overall health, including heart disease and diabetes.  Please see the attached documents for additional preventive care recommendations.

## 2024-02-28 NOTE — Progress Notes (Signed)
 Subjective:   Lindsey Harmon is a 77 y.o. who presents for a Medicare Wellness preventive visit.  As a reminder, Annual Wellness Visits don't include a physical exam, and some assessments may be limited, especially if this visit is performed virtually. We may recommend an in-person follow-up visit with your provider if needed.  Visit Complete: Virtual I connected with  Lindsey Harmon on 02/28/24 by a audio enabled telemedicine application and verified that I am speaking with the correct person using two identifiers.  Patient Location: Home  Provider Location: Home Office  I discussed the limitations of evaluation and management by telemedicine. The patient expressed understanding and agreed to proceed.  Vital Signs: Because this visit was a virtual/telehealth visit, some criteria may be missing or patient reported. Any vitals not documented were not able to be obtained and vitals that have been documented are patient reported.  VideoDeclined- This patient declined Librarian, academic. Therefore the visit was completed with audio only.  Persons Participating in Visit: Patient.  AWV Questionnaire: Yes: Patient Medicare AWV questionnaire was completed by the patient on 02/23/24 and 02/27/24; I have confirmed that all information answered by patient is correct and no changes since this date.  Cardiac Risk Factors include: advanced age (>85men, >56 women);dyslipidemia;obesity (BMI >30kg/m2);hypertension     Objective:    Today's Vitals   02/28/24 1345  Weight: 165 lb (74.8 kg)  Height: 5' 2 (1.575 m)   Body mass index is 30.18 kg/m.     02/28/2024    2:02 PM 10/07/2023   10:26 PM 10/07/2023    2:28 PM 02/22/2023    2:15 PM 02/17/2022    2:53 PM 12/23/2021    9:57 AM 02/16/2021    3:48 PM  Advanced Directives  Does Patient Have a Medical Advance Directive? Yes Yes No Yes Yes Yes Yes  Type of Estate agent of [place];Living will  Healthcare Power of [employer] of [place];Living will Healthcare Power of [place];Living will  Healthcare Power of [place];Living will  Does patient want to make changes to medical advance directive? No - Patient declined Yes (Inpatient - patient defers changing a medical advance directive at this time - Information given)   No - Patient declined  No - Patient declined  Copy of Healthcare Power of Attorney in Chart? Yes - validated most recent copy scanned in chart (See row information)   No - copy requested No - copy requested  No - copy requested    Current Medications (verified) Outpatient Encounter Medications as of 02/28/2024  Medication Sig   amLODipine  (NORVASC ) 5 MG tablet Take 1 tablet (5 mg total) by mouth daily.   Calcium  Carbonate-Vitamin D (CALTRATE 600+D PO) Take by mouth.   cephALEXin  (KEFLEX ) 250 MG capsule Take 250 mg by mouth daily.   olmesartan  (BENICAR ) 40 MG tablet Take 1 tablet (40 mg total) by mouth daily.   Omega-3 Fatty Acids (FISH OIL) 1200 MG CAPS Take by mouth daily.   omeprazole  (PRILOSEC) 20 MG capsule Take 1 capsule (20 mg total) by mouth 2 (two) times daily.   rosuvastatin  (CRESTOR ) 20 MG tablet Take 1 tablet (20 mg total) by mouth daily.   sertraline  (ZOLOFT ) 50 MG tablet Take 1.5 tablets (75 mg total) by mouth daily.   mupirocin ointment (BACTROBAN) 2 % Apply 1 Application topically 2 (two) times daily. (Patient not taking: Reported on 02/28/2024)   No facility-administered encounter medications on file as of 02/28/2024.  Allergies (verified) Ace inhibitors   History: Past Medical History:  Diagnosis Date   Abnormal liver function    Cancer (HCC)    Skin cancer   Cystocele, unspecified (CODE)    Fibrocystic breast disease    GERD (gastroesophageal reflux disease)    Hypertension    Intrinsic sphincter deficiency    Pelvic relaxation    Pure hypercholesterolemia    Rectocele    SUI (stress urinary incontinence, female)     Urinary retention    Urinary retention    Past Surgical History:  Procedure Laterality Date   ABDOMINAL HYSTERECTOMY     APPENDECTOMY     Bladder Tack     burch urethropexy  10/24/2000   laparoscopic colpopexy/culdoplasty   COLONOSCOPY WITH PROPOFOL  N/A 08/10/2016   Procedure: COLONOSCOPY WITH PROPOFOL ;  Surgeon: Lindsey RAYMOND Mariner, MD;  [facility] ENDOSCOPY;  Service: Endoscopy;  Laterality: N/A;   COLONOSCOPY WITH PROPOFOL  N/A 10/20/2016   Procedure: COLONOSCOPY WITH PROPOFOL ;  Surgeon: Lindsey Reyes ORN, MD;  Location: [facility] ENDOSCOPY;  Service: Endoscopy;  Laterality: N/A;   COLONOSCOPY WITH PROPOFOL  N/A 06/01/2017   Procedure: COLONOSCOPY WITH PROPOFOL ;  Surgeon: Lindsey Reyes ORN, MD;  Location: [facility] ENDOSCOPY;  Service: Endoscopy;  Laterality: N/A;   COLONOSCOPY WITH PROPOFOL  N/A 12/23/2021   Procedure: COLONOSCOPY WITH PROPOFOL ;  Surgeon: Lindsey Reyes ORN, MD;  Location: [facility] ENDOSCOPY;  Service: Endoscopy;  Laterality: N/A;   COLPORRHAPHY     forv repair cystocele anterior   EYE SURGERY  08/19/23 - 09/02/23   cataract surgery   TRANSVAGINAL TAPE (TVT) REMOVAL     Family History  Problem Relation Age of Onset   Pneumonia Mother        died of presumed aspiration pneumonia   Hypertension Mother    Hearing loss Mother    Cancer Father        prostate   Heart disease Paternal Grandfather        myocardial infarction   Alzheimer's disease Brother    Breast cancer Neg Hx    Social History   Socioeconomic History   Marital status: Married    Spouse name: Not on file   Number of children: Not on file   Years of education: Not on file   Highest education level: 12th grade  Occupational History   Not on file  Tobacco Use   Smoking status: Never   Smokeless tobacco: Never  Vaping Use   Vaping status: Never Used  Substance and Sexual Activity   Alcohol use: Never   Drug use: Never   Sexual activity: Not Currently    Birth control/protection: Abstinence  Other  Topics Concern   Not on file  Social History Narrative   married   Social Drivers of Corporate investment banker Strain: Low Risk  (02/27/2024)   Overall Financial Resource Strain (CARDIA)    Difficulty of Paying Living Expenses: Not hard at all  Food Insecurity: No Food Insecurity (02/27/2024)   Hunger Vital Sign    Worried About Running Out of Food in the Last Year: Never true    Ran Out of Food in the Last Year: Never true  Transportation Needs: No Transportation Needs (02/27/2024)   PRAPARE - Administrator, Civil Service (Medical): No    Lack of Transportation (Non-Medical): No  Physical Activity: Inactive (02/28/2024)   Exercise Vital Sign    Days of Exercise per Week: 0 days    Minutes of Exercise per Session: 0 min  Stress: No Stress Concern Present (02/28/2024)   [employer] of Occupational Health - Occupational Stress Questionnaire    Feeling of Stress: Not at all  Social Connections: Moderately Integrated (02/27/2024)   Social Connection and Isolation Panel    Frequency of Communication with Friends and Family: Twice a week    Frequency of Social Gatherings with Friends and Family: Twice a week    Attends Religious Services: More than 4 times per year    Active Member of [employer] or Organizations: No    Attends Engineer, structural: Never    Marital Status: Married    Tobacco Counseling Counseling given: Not Answered    Clinical Intake:  Pre-visit preparation completed: Yes  Pain : No/denies pain     BMI - recorded: 30.18 Nutritional Status: BMI > 30  Obese Nutritional Risks: None Diabetes: No  Lab Results  Component Value Date   HGBA1C 6.0 (H) 02/15/2024   HGBA1C 6.1 08/09/2023   HGBA1C 6.2 04/08/2023     How often do you need to have someone help you when you read instructions, pamphlets, or other written materials from your doctor or pharmacy?: 1 - Never  Interpreter Needed?: No  Information entered by :: R. Shauni Henner  LPN   Activities of Daily Living     02/23/2024    5:00 PM 10/07/2023   10:35 PM  In your present state of health, do you have any difficulty performing the following activities:  Hearing? 0   Vision? 0   Difficulty concentrating or making decisions? 0   Walking or climbing stairs? 0   Dressing or bathing? 0   Doing errands, shopping? 0 0  Preparing Food and eating ? N   Using the Toilet? N   In the past six months, have you accidently leaked urine? N   Do you have problems with loss of bowel control? N   Managing your Medications? N   Managing your Finances? N   Housekeeping or managing your Housekeeping? N     Patient Care Team: Glendia Shad, MD as PCP - General (Internal Medicine) Isenstein, Arin L, MD (Dermatology)  I have updated your Care Teams any recent Medical Services you may have received from other providers in the past year.     Assessment:   This is a routine wellness examination for [place].  Hearing/Vision screen Hearing Screening - Comments:: No issues Vision Screening - Comments:: glasses   Goals Addressed             This Visit's Progress    Patient Stated       Wants to exercise more       Depression Screen     02/28/2024    1:54 PM 12/14/2023   12:44 PM 02/22/2023    2:08 PM 07/05/2022   10:39 AM 03/02/2022   10:39 AM 02/17/2022    2:55 PM 10/30/2021   10:34 AM  PHQ 2/9 Scores  PHQ - 2 Score 0 0 0 0 0 0 0  PHQ- 9 Score 0  0        Fall Risk     02/23/2024    5:00 PM 12/14/2023   12:44 PM 10/14/2023   11:04 AM 02/19/2023    9:47 AM 07/05/2022   10:39 AM  Fall Risk   Falls in the past year? 1 0 0 0 0  Number falls in past yr: 0 0 0 0 0  Injury with Fall? 0 0 0 0 0  Risk  for fall due to : History of fall(s);Impaired balance/gait No Fall Risks No Fall Risks No Fall Risks No Fall Risks  Risk for fall due to: Comment tripped over a lawn ornament      Follow up Falls evaluation completed;Falls prevention discussed Falls evaluation  completed Falls evaluation completed Falls prevention discussed;Falls evaluation completed Falls evaluation completed    MEDICARE RISK AT HOME:  Medicare Risk at Home Any stairs in or around the home?: (Patient-Rptd) No If so, are there any without handrails?: (Patient-Rptd) No Home free of loose throw rugs in walkways, pet beds, electrical cords, etc?: (Patient-Rptd) Yes Adequate lighting in your home to reduce risk of falls?: (Patient-Rptd) Yes Life alert?: (Patient-Rptd) No Use of a cane, walker or w/c?: (Patient-Rptd) No Grab bars in the bathroom?: No Shower chair or bench in shower?: (Patient-Rptd) No Elevated toilet seat or a handicapped toilet?: (Patient-Rptd) No  TIMED UP AND GO:  Was the test performed?  No  Cognitive Function: 6CIT completed    09/22/2017   10:51 AM 09/21/2016   11:04 AM  MMSE - Mini Mental State Exam  Orientation to time 5 5   Orientation to Place 5 5   Registration 3 3   Attention/ Calculation 5 5   Recall 3 3   Language- name 2 objects 2 2   Language- repeat 1 1  Language- follow 3 step command 3 3   Language- read & follow direction 1 1   Write a sentence 1 1   Copy design 1 1   Total score 30 30      Data saved with a previous flowsheet row definition        02/28/2024    2:02 PM 02/22/2023    2:16 PM 11/27/2018    9:14 AM  6CIT Screen  What Year? 0 points 0 points 0 points  What month? 0 points 0 points 0 points  What time? 0 points 0 points 0 points  Count back from 20 0 points 0 points 0 points  Months in reverse 0 points 0 points 0 points  Repeat phrase 0 points 0 points 0 points  Total Score 0 points 0 points 0 points    Immunizations Immunization History  Administered Date(s) Administered   Fluad Quad(high Dose 65+) 01/25/2019, 02/01/2020, 02/20/2021, 03/02/2022   INFLUENZA, HIGH DOSE SEASONAL PF 02/17/2016, 02/22/2017, 02/28/2018, 02/09/2023, 02/17/2024   PFIZER Comirnaty(Gray Top)Covid-19 Tri-Sucrose Vaccine 09/11/2020    PFIZER(Purple Top)SARS-COV-2 Vaccination 06/05/2019, 06/30/2019, 03/05/2020   Pfizer Covid-19 Vaccine Bivalent Booster 26yrs & up 03/12/2021   Pfizer(Comirnaty)Fall Seasonal Vaccine 12 years and older 03/05/2022, 02/09/2023   Pneumococcal Conjugate-13 11/24/2016   Pneumococcal Polysaccharide-23 06/01/2018   Respiratory Syncytial Virus Vaccine,Recomb Aduvanted(Arexvy) 08/05/2022    Screening Tests Health Maintenance  Topic Date Due   DTaP/Tdap/Td (1 - Tdap) Never done   Zoster Vaccines- Shingrix (1 of 2) Never done   COVID-19 Vaccine (8 - 2025-26 season) 01/16/2024   Medicare Annual Wellness (AWV)  02/22/2024   Mammogram  10/31/2024   Pneumococcal Vaccine: 50+ Years  Completed   Influenza Vaccine  Completed   DEXA SCAN  Completed   Meningococcal B Vaccine  Aged Out   Colonoscopy  Discontinued    Health Maintenance Items Addressed: Discussed the need to update covid, tetanus (Tdap) and shingles vaccines.    Additional Screening:  Vision Screening: Recommended annual ophthalmology exams for early detection of glaucoma and other disorders of the eye. Is the patient up to date with their annual eye  exam?  Yes  Who is the provider or what is the name of the office in which the patient attends annual eye exams?  [facility]  Dental Screening: Recommended annual dental exams for proper oral hygiene  Community Resource Referral / Chronic Care Management: CRR required this visit?  No   CCM required this visit?  No   Plan:    I have personally reviewed and noted the following in the patient's chart:   Medical and social history Use of alcohol, tobacco or illicit drugs  Current medications and supplements including opioid prescriptions. Patient is not currently taking opioid prescriptions. Functional ability and status Nutritional status Physical activity Advanced directives List of other physicians Hospitalizations, surgeries, and ER visits in previous 12  months Vitals Screenings to include cognitive, depression, and falls Referrals and appointments  In addition, I have reviewed and discussed with patient certain preventive protocols, quality metrics, and best practice recommendations. A written personalized care plan for preventive services as well as general preventive health recommendations were provided to patient.   Angeline Fredericks, LPN   [id number]   After Visit Summary: (MyChart) Due to this being a telephonic visit, the after visit summary with patients personalized plan was offered to patient via MyChart   Notes: Nothing significant to report at this time. Phone note sent to PCP

## 2024-02-28 NOTE — Telephone Encounter (Signed)
 Please call and confirm how she is doing. If symptoms are worsening or if she is not feeling better, then needs to be reevaluated.

## 2024-02-29 ENCOUNTER — Ambulatory Visit: Admitting: Family

## 2024-02-29 ENCOUNTER — Ambulatory Visit
Admission: RE | Admit: 2024-02-29 | Discharge: 2024-02-29 | Disposition: A | Discharge status: Home / Self Care | Attending: Family | Admitting: Family

## 2024-02-29 ENCOUNTER — Encounter: Payer: Self-pay | Admitting: Family

## 2024-02-29 ENCOUNTER — Ambulatory Visit: Payer: Self-pay | Admitting: Family

## 2024-02-29 ENCOUNTER — Ambulatory Visit
Admission: RE | Admit: 2024-02-29 | Discharge: 2024-02-29 | Disposition: A | Discharge status: Home / Self Care | Source: Ambulatory Visit | Attending: Family | Admitting: Family

## 2024-02-29 VITALS — BP 134/70 | HR 90 | Temp 98.8°F | Ht 62.0 in | Wt 164.0 lb

## 2024-02-29 DIAGNOSIS — J40 Bronchitis, not specified as acute or chronic: Secondary | ICD-10-CM

## 2024-02-29 DIAGNOSIS — R519 Headache, unspecified: Secondary | ICD-10-CM | POA: Diagnosis not present

## 2024-02-29 DIAGNOSIS — K449 Diaphragmatic hernia without obstruction or gangrene: Secondary | ICD-10-CM | POA: Diagnosis not present

## 2024-02-29 DIAGNOSIS — R059 Cough, unspecified: Secondary | ICD-10-CM | POA: Diagnosis not present

## 2024-02-29 DIAGNOSIS — J189 Pneumonia, unspecified organism: Secondary | ICD-10-CM | POA: Diagnosis not present

## 2024-02-29 DIAGNOSIS — J029 Acute pharyngitis, unspecified: Secondary | ICD-10-CM | POA: Diagnosis not present

## 2024-02-29 DIAGNOSIS — I700 Atherosclerosis of aorta: Secondary | ICD-10-CM | POA: Diagnosis not present

## 2024-02-29 DIAGNOSIS — I10 Essential (primary) hypertension: Secondary | ICD-10-CM | POA: Diagnosis not present

## 2024-02-29 MED ORDER — BENZONATATE 200 MG PO CAPS: 200.0000 mg | ORAL_CAPSULE | Freq: Two times a day (BID) | ORAL | 0 refills | Status: DC | PRN

## 2024-02-29 MED ORDER — PREDNISONE 10 MG PO TABS: ORAL_TABLET | ORAL | 0 refills | Status: DC

## 2024-02-29 NOTE — Patient Instructions (Signed)
 Start prednisone  taper tomorrow; get all doses in by noon each day as can interfere with sleep Trial of tessalon  perles for cough  Chest xray at [facility] [address] outpatient imaging. No appointment needed; you may walk in. \  Please let me know if you are not feeling better.

## 2024-02-29 NOTE — Progress Notes (Unsigned)
   Assessment & Plan:  There are no diagnoses linked to this encounter.   Return precautions given.   Risks, benefits, and alternatives of the medications and treatment plan prescribed today were discussed, and patient expressed understanding.   Education regarding symptom management and diagnosis given to patient on AVS either electronically or printed.  No follow-ups on file.  [name], FNP  Subjective:    Patient ID: Lindsey Harmon, female    DOB: [birth year], 78 y.o.   MRN: [id number]  CC: Lindsey Harmon is a 78 y.o. female who presents today for an acute visit.    HPI: HPI  Complains of wet cough x 6 days, unchanged.   Endorses episodic sore throat. She is having nasal congestion, HA.   Denies fever, chills, SOB, wheezing  Seen by Dr Gasper 02/27/24 , negative strep Recommended OTC robitussin  H/o PNA ( hospitalized 09/16/23,given prednisone , cefdinir  at discharge) , respiratory failure, CKD, HTN, chronic urinary retention    Diarrhea from augmentin and azithromycin  09/2023  No h/o ckd Never smoker  Allergies: Ace inhibitors Current Outpatient Medications on File Prior to Visit  Medication Sig Dispense Refill   amLODipine  (NORVASC ) 5 MG tablet Take 1 tablet (5 mg total) by mouth daily. 90 tablet 1   Calcium  Carbonate-Vitamin D (CALTRATE 600+D PO) Take by mouth.     cephALEXin  (KEFLEX ) 250 MG capsule Take 250 mg by mouth daily.     olmesartan  (BENICAR ) 40 MG tablet Take 1 tablet (40 mg total) by mouth daily. 90 tablet 3   Omega-3 Fatty Acids (FISH OIL) 1200 MG CAPS Take by mouth daily.     omeprazole  (PRILOSEC) 20 MG capsule Take 1 capsule (20 mg total) by mouth 2 (two) times daily. 180 capsule 3   rosuvastatin  (CRESTOR ) 20 MG tablet Take 1 tablet (20 mg total) by mouth daily. 90 tablet 3   sertraline  (ZOLOFT ) 50 MG tablet Take 1.5 tablets (75 mg total) by mouth daily. 135 tablet 1   mupirocin ointment (BACTROBAN) 2 % Apply 1 Application topically 2 (two) times  daily. (Patient not taking: Reported on 02/29/2024) 22 g 0   No current facility-administered medications on file prior to visit.    Review of Systems    Objective:    BP 134/70   Pulse 90   Temp 98.8 F (37.1 C) (Oral)   Ht 5' 2 (1.575 m)   Wt 164 lb (74.4 kg)   SpO2 95%   BMI 30.00 kg/m   BP Readings from Last 3 Encounters:  02/29/24 134/70  02/27/24 130/65  02/17/24 128/72   Wt Readings from Last 3 Encounters:  02/29/24 164 lb (74.4 kg)  02/28/24 165 lb (74.8 kg)  02/27/24 165 lb 1.6 oz (74.9 kg)    Physical Exam

## 2024-03-01 NOTE — Assessment & Plan Note (Addendum)
 Reassuring exam. No adventitious lung sounds. CXR without acute findings. Provided prednisone  taper, tessalon  perles. She will let me know how she is doing

## 2024-03-06 DIAGNOSIS — H26493 Other secondary cataract, bilateral: Secondary | ICD-10-CM | POA: Diagnosis not present

## 2024-03-06 DIAGNOSIS — D3132 Benign neoplasm of left choroid: Secondary | ICD-10-CM | POA: Diagnosis not present

## 2024-03-06 DIAGNOSIS — H35371 Puckering of macula, right eye: Secondary | ICD-10-CM | POA: Diagnosis not present

## 2024-03-09 ENCOUNTER — Other Ambulatory Visit: Payer: Self-pay | Admitting: Internal Medicine

## 2024-03-09 DIAGNOSIS — H33311 Horseshoe tear of retina without detachment, right eye: Secondary | ICD-10-CM | POA: Diagnosis not present

## 2024-03-09 DIAGNOSIS — D3132 Benign neoplasm of left choroid: Secondary | ICD-10-CM | POA: Diagnosis not present

## 2024-03-09 DIAGNOSIS — H43813 Vitreous degeneration, bilateral: Secondary | ICD-10-CM | POA: Diagnosis not present

## 2024-04-25 DIAGNOSIS — D1801 Hemangioma of skin and subcutaneous tissue: Secondary | ICD-10-CM | POA: Diagnosis not present

## 2024-04-25 DIAGNOSIS — L570 Actinic keratosis: Secondary | ICD-10-CM | POA: Diagnosis not present

## 2024-04-25 DIAGNOSIS — L821 Other seborrheic keratosis: Secondary | ICD-10-CM | POA: Diagnosis not present

## 2024-04-25 DIAGNOSIS — Z85828 Personal history of other malignant neoplasm of skin: Secondary | ICD-10-CM | POA: Diagnosis not present

## 2024-04-25 DIAGNOSIS — Z08 Encounter for follow-up examination after completed treatment for malignant neoplasm: Secondary | ICD-10-CM | POA: Diagnosis not present

## 2024-05-22 ENCOUNTER — Other Ambulatory Visit (INDEPENDENT_AMBULATORY_CARE_PROVIDER_SITE_OTHER)

## 2024-05-22 DIAGNOSIS — E7800 Pure hypercholesterolemia, unspecified: Secondary | ICD-10-CM

## 2024-05-22 DIAGNOSIS — R739 Hyperglycemia, unspecified: Secondary | ICD-10-CM

## 2024-05-22 DIAGNOSIS — I10 Essential (primary) hypertension: Secondary | ICD-10-CM

## 2024-05-22 LAB — HEMOGLOBIN A1C: Hgb A1c MFr Bld: 5.9 % (ref 4.6–6.5)

## 2024-05-22 LAB — BASIC METABOLIC PANEL WITH GFR
BUN: 24 mg/dL — ABNORMAL HIGH (ref 6–23)
CO2: 29 meq/L (ref 19–32)
Calcium: 9.1 mg/dL (ref 8.4–10.5)
Chloride: 104 meq/L (ref 96–112)
Creatinine, Ser: 0.91 mg/dL (ref 0.40–1.20)
GFR: 59.38 mL/min — ABNORMAL LOW
Glucose, Bld: 93 mg/dL (ref 70–99)
Potassium: 4 meq/L (ref 3.5–5.1)
Sodium: 139 meq/L (ref 135–145)

## 2024-05-22 LAB — HEPATIC FUNCTION PANEL
ALT: 20 U/L (ref 3–35)
AST: 25 U/L (ref 5–37)
Albumin: 4.2 g/dL (ref 3.5–5.2)
Alkaline Phosphatase: 77 U/L (ref 39–117)
Bilirubin, Direct: 0.1 mg/dL (ref 0.1–0.3)
Total Bilirubin: 0.7 mg/dL (ref 0.2–1.2)
Total Protein: 6.3 g/dL (ref 6.0–8.3)

## 2024-05-22 LAB — LIPID PANEL
Cholesterol: 110 mg/dL (ref 28–200)
HDL: 44 mg/dL
LDL Cholesterol: 45 mg/dL (ref 10–99)
NonHDL: 65.86
Total CHOL/HDL Ratio: 2
Triglycerides: 106 mg/dL (ref 10.0–149.0)
VLDL: 21.2 mg/dL (ref 0.0–40.0)

## 2024-05-23 ENCOUNTER — Ambulatory Visit: Payer: Self-pay | Admitting: Internal Medicine

## 2024-05-29 ENCOUNTER — Ambulatory Visit: Admitting: Internal Medicine

## 2024-05-29 ENCOUNTER — Encounter: Payer: Self-pay | Admitting: Internal Medicine

## 2024-05-29 VITALS — BP 126/74 | HR 71 | Temp 98.2°F | Ht 62.0 in | Wt 163.2 lb

## 2024-05-29 DIAGNOSIS — N1831 Chronic kidney disease, stage 3a: Secondary | ICD-10-CM

## 2024-05-29 DIAGNOSIS — F429 Obsessive-compulsive disorder, unspecified: Secondary | ICD-10-CM | POA: Diagnosis not present

## 2024-05-29 DIAGNOSIS — Z860100 Personal history of colon polyps, unspecified: Secondary | ICD-10-CM

## 2024-05-29 DIAGNOSIS — I10 Essential (primary) hypertension: Secondary | ICD-10-CM | POA: Diagnosis not present

## 2024-05-29 DIAGNOSIS — Z87440 Personal history of urinary (tract) infections: Secondary | ICD-10-CM

## 2024-05-29 DIAGNOSIS — E7800 Pure hypercholesterolemia, unspecified: Secondary | ICD-10-CM | POA: Diagnosis not present

## 2024-05-29 DIAGNOSIS — D649 Anemia, unspecified: Secondary | ICD-10-CM | POA: Diagnosis not present

## 2024-05-29 DIAGNOSIS — R739 Hyperglycemia, unspecified: Secondary | ICD-10-CM | POA: Diagnosis not present

## 2024-05-29 MED ORDER — ROSUVASTATIN CALCIUM 20 MG PO TABS: 20.0000 mg | ORAL_TABLET | Freq: Every day | ORAL | 3 refills | Status: AC

## 2024-05-29 MED ORDER — OLMESARTAN MEDOXOMIL 40 MG PO TABS: 40.0000 mg | ORAL_TABLET | Freq: Every day | ORAL | 3 refills | Status: AC

## 2024-05-29 MED ORDER — AMLODIPINE BESYLATE 5 MG PO TABS: 5.0000 mg | ORAL_TABLET | Freq: Every day | ORAL | 1 refills | Status: AC

## 2024-05-29 MED ORDER — OMEPRAZOLE 20 MG PO CPDR: 20.0000 mg | DELAYED_RELEASE_CAPSULE | Freq: Two times a day (BID) | ORAL | 1 refills | Status: AC

## 2024-05-29 MED ORDER — SERTRALINE HCL 100 MG PO TABS: 100.0000 mg | ORAL_TABLET | Freq: Every day | ORAL | 1 refills | Status: AC

## 2024-05-29 NOTE — Progress Notes (Signed)
 "  Subjective:    Patient ID: Lindsey Harmon, female    DOB: [birth year], 79 y.o.   MRN: [id number]  Patient here for  Chief Complaint  Patient presents with   Medical Management of Chronic Issues    HPI Here for a scheduled follow up -  follow up regarding OCD, hypertension and hypercholesterolemia. Continues on sertraline . Last visit, amlodipine  increased to 5mg  q day. Home blood pressures averaging 130/70. Tries to stay active. No chest pain. Breathing stable. No abdominal pain or bowel change reported. Handling stress. Continues on zoloft . Feels would benefit from increased dose. Discussed increasing to 100mg  q day. Discussed labs.    Past Medical History:  Diagnosis Date   Abnormal liver function    Cancer (HCC)    Skin cancer   Cystocele, unspecified (CODE)    Fibrocystic breast disease    GERD (gastroesophageal reflux disease)    Hypertension    Intrinsic sphincter deficiency    Pelvic relaxation    Pure hypercholesterolemia    Rectocele    SUI (stress urinary incontinence, female)    Urinary retention    Urinary retention    Past Surgical History:  Procedure Laterality Date   ABDOMINAL HYSTERECTOMY     APPENDECTOMY     Bladder Tack     burch urethropexy  10/24/2000   laparoscopic colpopexy/culdoplasty   COLONOSCOPY WITH PROPOFOL  N/A 08/10/2016   Procedure: COLONOSCOPY WITH PROPOFOL ;  Surgeon: Gladis RAYMOND Mariner, MD;  Location: [facility] ENDOSCOPY;  Service: Endoscopy;  Laterality: N/A;   COLONOSCOPY WITH PROPOFOL  N/A 10/20/2016   Procedure: COLONOSCOPY WITH PROPOFOL ;  Surgeon: Dessa Reyes ORN, MD;  Location: [facility] ENDOSCOPY;  Service: Endoscopy;  Laterality: N/A;   COLONOSCOPY WITH PROPOFOL  N/A 06/01/2017   Procedure: COLONOSCOPY WITH PROPOFOL ;  Surgeon: Dessa Reyes ORN, MD;  Location: [facility] ENDOSCOPY;  Service: Endoscopy;  Laterality: N/A;   COLONOSCOPY WITH PROPOFOL  N/A 12/23/2021   Procedure: COLONOSCOPY WITH PROPOFOL ;  Surgeon: Dessa Reyes ORN, MD;  Location:  [facility] ENDOSCOPY;  Service: Endoscopy;  Laterality: N/A;   COLPORRHAPHY     forv repair cystocele anterior   EYE SURGERY  08/19/23 - 09/02/23   cataract surgery   TRANSVAGINAL TAPE (TVT) REMOVAL     Family History  Problem Relation Age of Onset   Pneumonia Mother        died of presumed aspiration pneumonia   Hypertension Mother    Hearing loss Mother    Cancer Father        prostate   Heart disease Paternal Grandfather        myocardial infarction   Alzheimer's disease Brother    Breast cancer Neg Hx    Social History   Socioeconomic History   Marital status: Married    Spouse name: Not on file   Number of children: Not on file   Years of education: Not on file   Highest education level: 12th grade  Occupational History   Not on file  Tobacco Use   Smoking status: Never   Smokeless tobacco: Never  Vaping Use   Vaping status: Never Used  Substance and Sexual Activity   Alcohol use: Never   Drug use: Never   Sexual activity: Not Currently    Birth control/protection: Abstinence  Other Topics Concern   Not on file  Social History Narrative   married   Social Drivers of Health   Tobacco Use: Low Risk (06/03/2024)   Patient History    Smoking Tobacco Use: Never  Smokeless Tobacco Use: Never    Passive Exposure: Not on file  Financial Resource Strain: Low Risk (02/27/2024)   Overall Financial Resource Strain (CARDIA)    Difficulty of Paying Living Expenses: Not hard at all  Food Insecurity: No Food Insecurity (02/27/2024)   Epic    Worried About Programme Researcher, Broadcasting/film/video in the Last Year: Never true    Ran Out of Food in the Last Year: Never true  Transportation Needs: No Transportation Needs (02/27/2024)   Epic    Lack of Transportation (Medical): No    Lack of Transportation (Non-Medical): No  Physical Activity: Inactive (02/28/2024)   Exercise Vital Sign    Days of Exercise per Week: 0 days    Minutes of Exercise per Session: 0 min  Stress: No Stress Concern  Present (02/28/2024)   [employer] of Occupational Health - Occupational Stress Questionnaire    Feeling of Stress: Not at all  Social Connections: Moderately Integrated (02/27/2024)   Social Connection and Isolation Panel    Frequency of Communication with Friends and Family: Twice a week    Frequency of Social Gatherings with Friends and Family: Twice a week    Attends Religious Services: More than 4 times per year    Active Member of Clubs or Organizations: No    Attends Banker Meetings: Never    Marital Status: Married  Depression (PHQ2-9): Low Risk (02/29/2024)   Depression (PHQ2-9)    PHQ-2 Score: 0  Alcohol Screen: Low Risk (02/28/2024)   Alcohol Screen    Last Alcohol Screening Score (AUDIT): 0  Housing: Low Risk (02/27/2024)   Epic    Unable to Pay for Housing in the Last Year: No    Number of Times Moved in the Last Year: 0    Homeless in the Last Year: No  Utilities: Not At Risk (02/28/2024)   Epic    Threatened with loss of utilities: No  Health Literacy: Adequate Health Literacy (02/28/2024)   B1300 Health Literacy    Frequency of need for help with medical instructions: Never     Review of Systems  Constitutional:  Negative for appetite change and unexpected weight change.  HENT:  Negative for congestion and sinus pressure.   Respiratory:  Negative for cough, chest tightness and shortness of breath.   Cardiovascular:  Negative for chest pain, palpitations and leg swelling.  Gastrointestinal:  Negative for abdominal pain, diarrhea, nausea and vomiting.  Genitourinary:  Negative for difficulty urinating and dysuria.  Musculoskeletal:  Negative for joint swelling and myalgias.  Skin:  Negative for color change and rash.  Neurological:  Negative for dizziness and light-headedness.  Psychiatric/Behavioral:  Negative for agitation and dysphoric mood.        Objective:     BP 126/74   Pulse 71   Temp 98.2 F (36.8 C) (Oral)   Ht 5' 2  (1.575 m)   Wt 163 lb 3.2 oz (74 kg)   SpO2 97%   BMI 29.85 kg/m  Wt Readings from Last 3 Encounters:  05/29/24 163 lb 3.2 oz (74 kg)  02/29/24 164 lb (74.4 kg)  02/28/24 165 lb (74.8 kg)    Physical Exam Vitals reviewed.  Constitutional:      General: She is not in acute distress.    Appearance: Normal appearance.  HENT:     Head: Normocephalic and atraumatic.     Right Ear: External ear normal.     Left Ear: External ear normal.  Mouth/Throat:     Pharynx: No oropharyngeal exudate or posterior oropharyngeal erythema.  Eyes:     General: No scleral icterus.       Right eye: No discharge.        Left eye: No discharge.     Conjunctiva/sclera: Conjunctivae normal.  Neck:     Thyroid : No thyromegaly.  Cardiovascular:     Rate and Rhythm: Normal rate and regular rhythm.  Pulmonary:     Effort: No respiratory distress.     Breath sounds: Normal breath sounds. No wheezing.  Abdominal:     General: Bowel sounds are normal.     Palpations: Abdomen is soft.     Tenderness: There is no abdominal tenderness.  Musculoskeletal:        General: No swelling or tenderness.     Cervical back: Neck supple. No tenderness.  Lymphadenopathy:     Cervical: No cervical adenopathy.  Skin:    Findings: No erythema or rash.  Neurological:     Mental Status: She is alert.  Psychiatric:        Mood and Affect: Mood normal.        Behavior: Behavior normal.         Outpatient Encounter Medications as of 05/29/2024  Medication Sig   Calcium  Carbonate-Vitamin D (CALTRATE 600+D PO) Take by mouth.   cephALEXin  (KEFLEX ) 250 MG capsule Take 250 mg by mouth daily.   Omega-3 Fatty Acids (FISH OIL) 1200 MG CAPS Take by mouth daily.   sertraline  (ZOLOFT ) 100 MG tablet Take 1 tablet (100 mg total) by mouth daily.   [DISCONTINUED] sertraline  (ZOLOFT ) 50 MG tablet Take 1.5 tablets (75 mg total) by mouth daily.   amLODipine  (NORVASC ) 5 MG tablet Take 1 tablet (5 mg total) by mouth daily.    olmesartan  (BENICAR ) 40 MG tablet Take 1 tablet (40 mg total) by mouth daily.   omeprazole  (PRILOSEC) 20 MG capsule Take 1 capsule (20 mg total) by mouth 2 (two) times daily.   rosuvastatin  (CRESTOR ) 20 MG tablet Take 1 tablet (20 mg total) by mouth daily.   [DISCONTINUED] amLODipine  (NORVASC ) 5 MG tablet Take 1 tablet (5 mg total) by mouth daily.   [DISCONTINUED] benzonatate  (TESSALON ) 200 MG capsule Take 1 capsule (200 mg total) by mouth 2 (two) times daily as needed for cough.   [DISCONTINUED] olmesartan  (BENICAR ) 40 MG tablet Take 1 tablet (40 mg total) by mouth daily.   [DISCONTINUED] omeprazole  (PRILOSEC) 20 MG capsule Take 1 capsule (20 mg total) by mouth 2 (two) times daily.   [DISCONTINUED] predniSONE  (DELTASONE ) 10 MG tablet Take 40 mg by mouth on day 1, then taper 10 mg daily until gone   [DISCONTINUED] rosuvastatin  (CRESTOR ) 20 MG tablet Take 1 tablet (20 mg total) by mouth daily.   No facility-administered encounter medications on file as of 05/29/2024.     Lab Results  Component Value Date   WBC 7.1 10/14/2023   HGB 13.3 10/14/2023   HCT 39.7 10/14/2023   PLT 255.0 10/14/2023   GLUCOSE 93 05/22/2024   CHOL 110 05/22/2024   TRIG 106.0 05/22/2024   HDL 44.00 05/22/2024   LDLDIRECT 83.0 05/30/2018   LDLCALC 45 05/22/2024   ALT 20 05/22/2024   AST 25 05/22/2024   NA 139 05/22/2024   K 4.0 05/22/2024   CL 104 05/22/2024   CREATININE 0.91 05/22/2024   BUN 24 (H) 05/22/2024   CO2 29 05/22/2024   TSH 4.03 08/09/2023   HGBA1C 5.9 05/22/2024  DG Chest 2 View Result Date: 02/29/2024 EXAM: 2 VIEW(S) XRAY OF THE CHEST 02/29/2024 11:11:20 AM COMPARISON: 10/07/2023 CLINICAL HISTORY: cough; h/o PNA. cough; h/o PNA, Bronchitis. Pt states sore throat, dry cough, headache and congestion since Friday. History of HTN. FINDINGS: LUNGS AND PLEURA: No focal pulmonary opacity. No pulmonary edema. No pleural effusion. No pneumothorax. HEART AND MEDIASTINUM: Calcified aorta. No acute  abnormality of the cardiac and mediastinal silhouettes. BONES AND SOFT TISSUES: No acute osseous abnormality. DIAPHRAGM AND UPPER ABDOMEN: Hiatal hernia. IMPRESSION: 1. No acute cardiopulmonary process. 2. Hiatal hernia. Electronically signed by: Oneil Devonshire MD 02/29/2024 11:30 AM EDT RP Workstation: [id number]       Assessment & Plan:  Obsessive-compulsive disorder, unspecified type Assessment & Plan: Continue zoloft . Discussed. Will increase zoloft  to 100mg  q day. Follow.  Call with update.    Essential hypertension, benign Assessment & Plan: Continue amlodipine  5mg  q day.  Follow pressures. Follow metabolic panel. Outside readings averaging 130/70.   Orders: -     Basic metabolic panel with GFR; Future  Hypercholesterolemia Assessment & Plan: Continue crestor . Diet and exercise. Follow lipid panel.  Discussed recent labs.  Lab Results  Component Value Date   CHOL 110 05/22/2024   HDL 44.00 05/22/2024   LDLCALC 45 05/22/2024   LDLDIRECT 83.0 05/30/2018   TRIG 106.0 05/22/2024   CHOLHDL 2 05/22/2024     Orders: -     Lipid panel; Future -     Hepatic function panel; Future -     TSH; Future  Hyperglycemia Assessment & Plan: Low carb diet and exercise. Follow met b and A1c.   Orders: -     Hemoglobin A1c; Future  Anemia, unspecified type Assessment & Plan: Follow cbc.   Orders: -     CBC with Differential/Platelet; Future  History of frequent urinary tract infections Assessment & Plan: Continues daily keflex .  Self caths.  Stable. Follow.    History of colon polyps Assessment & Plan: Colonoscopy 12/23/21 - Three 6 mm polyps in the sigmoid colon and in the descending colon - pathology tubular adenomas.    Stage 3a chronic kidney disease (HCC) Assessment & Plan: Continue to avoid antiinflammatories.  Stay hydrated.  Follow metabolic panel. Continue benicar .    Other orders -     amLODIPine  Besylate; Take 1 tablet (5 mg total) by mouth daily.  Dispense: 90  tablet; Refill: 1 -     Olmesartan  Medoxomil; Take 1 tablet (40 mg total) by mouth daily.  Dispense: 90 tablet; Refill: 3 -     Omeprazole ; Take 1 capsule (20 mg total) by mouth 2 (two) times daily.  Dispense: 180 capsule; Refill: 1 -     Rosuvastatin  Calcium ; Take 1 tablet (20 mg total) by mouth daily.  Dispense: 90 tablet; Refill: 3 -     Sertraline  HCl; Take 1 tablet (100 mg total) by mouth daily.  Dispense: 90 tablet; Refill: 1     Allena Hamilton, MD "

## 2024-06-03 ENCOUNTER — Encounter: Payer: Self-pay | Admitting: Internal Medicine

## 2024-06-03 NOTE — Assessment & Plan Note (Signed)
 Continue amlodipine  5mg  q day.  Follow pressures. Follow metabolic panel. Outside readings averaging 130/70.

## 2024-06-03 NOTE — Assessment & Plan Note (Signed)
Continues daily keflex.  Self caths.  Stable.  Follow.  

## 2024-06-03 NOTE — Assessment & Plan Note (Signed)
 Continue zoloft . Discussed. Will increase zoloft  to 100mg  q day. Follow.  Call with update.

## 2024-06-03 NOTE — Assessment & Plan Note (Signed)
 Low-carb diet and exercise.  Follow met b and A1c.

## 2024-06-03 NOTE — Assessment & Plan Note (Signed)
 Continue to avoid antiinflammatories.  Stay hydrated.  Follow metabolic panel. Continue benicar .

## 2024-06-03 NOTE — Assessment & Plan Note (Signed)
 Follow cbc.

## 2024-06-03 NOTE — Assessment & Plan Note (Signed)
Colonoscopy 12/23/21 - Three 6 mm polyps in the sigmoid colon and in the descending colon - pathology tubular adenomas.

## 2024-06-03 NOTE — Assessment & Plan Note (Signed)
 Continue crestor . Diet and exercise. Follow lipid panel.  Discussed recent labs.  Lab Results  Component Value Date   CHOL 110 05/22/2024   HDL 44.00 05/22/2024   LDLCALC 45 05/22/2024   LDLDIRECT 83.0 05/30/2018   TRIG 106.0 05/22/2024   CHOLHDL 2 05/22/2024

## 2024-09-28 ENCOUNTER — Other Ambulatory Visit

## 2024-10-02 ENCOUNTER — Encounter: Admitting: Internal Medicine

## 2025-03-04 ENCOUNTER — Ambulatory Visit
